# Patient Record
Sex: Female | Born: 1975 | Race: Black or African American | Hispanic: No | Marital: Single | Smoking: Former smoker
Health system: Southern US, Community
[De-identification: ages and names within clinical notes are randomized; demographics above are authoritative.]

## PROBLEM LIST (undated history)

## (undated) DIAGNOSIS — J40 Bronchitis, not specified as acute or chronic: Secondary | ICD-10-CM

## (undated) DIAGNOSIS — I10 Essential (primary) hypertension: Secondary | ICD-10-CM

## (undated) DIAGNOSIS — O039 Complete or unspecified spontaneous abortion without complication: Secondary | ICD-10-CM

## (undated) DIAGNOSIS — J45909 Unspecified asthma, uncomplicated: Secondary | ICD-10-CM

## (undated) HISTORY — DX: Essential (primary) hypertension: I10

## (undated) HISTORY — PX: LIGAMENT REPAIR: SHX5444

## (undated) HISTORY — DX: Bronchitis, not specified as acute or chronic: J40

## (undated) HISTORY — PX: NO PAST SURGERIES: SHX2092

---

## 1999-03-27 ENCOUNTER — Encounter: Payer: Self-pay | Admitting: *Deleted

## 1999-03-27 ENCOUNTER — Ambulatory Visit (HOSPITAL_COMMUNITY): Admission: RE | Admit: 1999-03-27 | Discharge: 1999-03-27 | Payer: Self-pay | Admitting: *Deleted

## 1999-06-20 ENCOUNTER — Inpatient Hospital Stay (HOSPITAL_COMMUNITY): Admission: AD | Admit: 1999-06-20 | Discharge: 1999-06-20 | Payer: Self-pay | Admitting: *Deleted

## 1999-07-20 ENCOUNTER — Inpatient Hospital Stay (HOSPITAL_COMMUNITY): Admission: AD | Admit: 1999-07-20 | Discharge: 1999-07-20 | Payer: Self-pay | Admitting: *Deleted

## 1999-07-30 ENCOUNTER — Inpatient Hospital Stay (HOSPITAL_COMMUNITY): Admission: AD | Admit: 1999-07-30 | Discharge: 1999-07-30 | Payer: Self-pay | Admitting: *Deleted

## 1999-08-02 ENCOUNTER — Inpatient Hospital Stay (HOSPITAL_COMMUNITY): Admission: AD | Admit: 1999-08-02 | Discharge: 1999-08-04 | Payer: Self-pay | Admitting: *Deleted

## 1999-12-19 ENCOUNTER — Encounter: Payer: Self-pay | Admitting: Emergency Medicine

## 1999-12-19 ENCOUNTER — Emergency Department (HOSPITAL_COMMUNITY): Admission: EM | Admit: 1999-12-19 | Discharge: 1999-12-19 | Payer: Self-pay | Admitting: Emergency Medicine

## 2000-07-29 ENCOUNTER — Inpatient Hospital Stay (HOSPITAL_COMMUNITY): Admission: AD | Admit: 2000-07-29 | Discharge: 2000-07-29 | Payer: Self-pay | Admitting: *Deleted

## 2001-01-17 ENCOUNTER — Encounter: Payer: Self-pay | Admitting: Emergency Medicine

## 2001-01-17 ENCOUNTER — Emergency Department (HOSPITAL_COMMUNITY): Admission: EM | Admit: 2001-01-17 | Discharge: 2001-01-18 | Payer: Self-pay | Admitting: Emergency Medicine

## 2001-01-22 ENCOUNTER — Encounter: Payer: Self-pay | Admitting: Emergency Medicine

## 2001-01-22 ENCOUNTER — Emergency Department (HOSPITAL_COMMUNITY): Admission: EM | Admit: 2001-01-22 | Discharge: 2001-01-22 | Payer: Self-pay | Admitting: Ophthalmology

## 2001-02-27 ENCOUNTER — Other Ambulatory Visit: Admission: RE | Admit: 2001-02-27 | Discharge: 2001-02-27 | Payer: Self-pay | Admitting: *Deleted

## 2001-07-13 ENCOUNTER — Emergency Department (HOSPITAL_COMMUNITY): Admission: EM | Admit: 2001-07-13 | Discharge: 2001-07-13 | Payer: Self-pay

## 2001-10-20 ENCOUNTER — Inpatient Hospital Stay (HOSPITAL_COMMUNITY): Admission: AD | Admit: 2001-10-20 | Discharge: 2001-10-20 | Payer: Self-pay | Admitting: *Deleted

## 2002-04-16 ENCOUNTER — Emergency Department (HOSPITAL_COMMUNITY): Admission: EM | Admit: 2002-04-16 | Discharge: 2002-04-16 | Payer: Self-pay

## 2002-09-21 ENCOUNTER — Inpatient Hospital Stay (HOSPITAL_COMMUNITY): Admission: AD | Admit: 2002-09-21 | Discharge: 2002-09-21 | Payer: Self-pay | Admitting: Obstetrics

## 2003-07-16 ENCOUNTER — Inpatient Hospital Stay (HOSPITAL_COMMUNITY): Admission: AD | Admit: 2003-07-16 | Discharge: 2003-07-18 | Payer: Self-pay | Admitting: Obstetrics & Gynecology

## 2004-05-09 ENCOUNTER — Emergency Department (HOSPITAL_COMMUNITY): Admission: EM | Admit: 2004-05-09 | Discharge: 2004-05-09 | Payer: Self-pay | Admitting: Ophthalmology

## 2005-01-09 ENCOUNTER — Emergency Department (HOSPITAL_COMMUNITY): Admission: EM | Admit: 2005-01-09 | Discharge: 2005-01-09 | Payer: Self-pay | Admitting: Emergency Medicine

## 2005-09-25 ENCOUNTER — Emergency Department (HOSPITAL_COMMUNITY): Admission: EM | Admit: 2005-09-25 | Discharge: 2005-09-25 | Payer: Self-pay | Admitting: Family Medicine

## 2005-10-20 ENCOUNTER — Inpatient Hospital Stay (HOSPITAL_COMMUNITY): Admission: AD | Admit: 2005-10-20 | Discharge: 2005-10-20 | Payer: Self-pay | Admitting: Obstetrics

## 2005-10-22 ENCOUNTER — Inpatient Hospital Stay (HOSPITAL_COMMUNITY): Admission: AD | Admit: 2005-10-22 | Discharge: 2005-10-22 | Payer: Self-pay | Admitting: Obstetrics

## 2005-10-25 ENCOUNTER — Inpatient Hospital Stay (HOSPITAL_COMMUNITY): Admission: AD | Admit: 2005-10-25 | Discharge: 2005-10-25 | Payer: Self-pay | Admitting: Obstetrics

## 2006-11-01 ENCOUNTER — Emergency Department (HOSPITAL_COMMUNITY): Admission: EM | Admit: 2006-11-01 | Discharge: 2006-11-01 | Payer: Self-pay | Admitting: Family Medicine

## 2007-12-29 ENCOUNTER — Inpatient Hospital Stay (HOSPITAL_COMMUNITY): Admission: AD | Admit: 2007-12-29 | Discharge: 2007-12-29 | Payer: Self-pay | Admitting: Obstetrics & Gynecology

## 2008-03-29 ENCOUNTER — Emergency Department (HOSPITAL_COMMUNITY): Admission: EM | Admit: 2008-03-29 | Discharge: 2008-03-29 | Payer: Self-pay | Admitting: Emergency Medicine

## 2010-02-20 ENCOUNTER — Emergency Department (HOSPITAL_COMMUNITY): Admission: EM | Admit: 2010-02-20 | Discharge: 2010-02-20 | Payer: Self-pay | Admitting: Family Medicine

## 2010-03-27 ENCOUNTER — Emergency Department (HOSPITAL_COMMUNITY): Admission: EM | Admit: 2010-03-27 | Discharge: 2010-03-27 | Payer: Self-pay | Admitting: Family Medicine

## 2010-06-27 ENCOUNTER — Emergency Department (HOSPITAL_COMMUNITY): Admission: EM | Admit: 2010-06-27 | Discharge: 2010-06-27 | Payer: Self-pay | Admitting: Emergency Medicine

## 2010-11-14 ENCOUNTER — Inpatient Hospital Stay (INDEPENDENT_AMBULATORY_CARE_PROVIDER_SITE_OTHER)
Admission: RE | Admit: 2010-11-14 | Discharge: 2010-11-14 | Disposition: A | Discharge status: Home / Self Care | Payer: Self-pay | Source: Ambulatory Visit | Attending: Family Medicine | Admitting: Family Medicine

## 2010-11-14 DIAGNOSIS — J309 Allergic rhinitis, unspecified: Secondary | ICD-10-CM

## 2010-11-14 DIAGNOSIS — J40 Bronchitis, not specified as acute or chronic: Secondary | ICD-10-CM

## 2011-01-26 NOTE — H&P (Signed)
[facility]  Patient:    Taylor Wilkinson, Taylor Wilkinson                    MRN: [id number] Adm. Date:  [id number] Disc. Date: [id number] Attending:  Donnetta Hutching                         History and Physical  HISTORY:                      The patient is a 35 year old gravida 6, para 2, AB 3, who was admitted in active labor.  At the time of admission patient had a cervix that was 3 cm dilated, a vertex at a minus 3 station, 80% effaced. Patient had no prenatal complications.  A significant thing is that patient did have three therapeutic abortions in the past.  DIAGNOSIS:                    intrauterine pregnancy at term.  PLAN:                         Labor and delivery. DD:  12/20/99 TD:  12/20/99 Job: [id number]

## 2011-02-16 ENCOUNTER — Inpatient Hospital Stay (INDEPENDENT_AMBULATORY_CARE_PROVIDER_SITE_OTHER)
Admission: RE | Admit: 2011-02-16 | Discharge: 2011-02-16 | Disposition: A | Discharge status: Home / Self Care | Payer: Self-pay | Source: Ambulatory Visit | Attending: Emergency Medicine | Admitting: Emergency Medicine

## 2011-02-16 DIAGNOSIS — J45909 Unspecified asthma, uncomplicated: Secondary | ICD-10-CM

## 2011-05-26 ENCOUNTER — Inpatient Hospital Stay (INDEPENDENT_AMBULATORY_CARE_PROVIDER_SITE_OTHER)
Admission: RE | Admit: 2011-05-26 | Discharge: 2011-05-26 | Disposition: A | Discharge status: Home / Self Care | Payer: Self-pay | Source: Ambulatory Visit | Attending: Family Medicine | Admitting: Family Medicine

## 2011-05-26 DIAGNOSIS — F17200 Nicotine dependence, unspecified, uncomplicated: Secondary | ICD-10-CM

## 2011-05-26 DIAGNOSIS — J9801 Acute bronchospasm: Secondary | ICD-10-CM

## 2011-05-26 DIAGNOSIS — J40 Bronchitis, not specified as acute or chronic: Secondary | ICD-10-CM

## 2011-06-05 LAB — URINALYSIS, ROUTINE W REFLEX MICROSCOPIC
Glucose, UA: NEGATIVE
Ketones, ur: NEGATIVE
Nitrite: NEGATIVE
Specific Gravity, Urine: <1.005 — ABNORMAL LOW
Urobilinogen, UA: 0.2
pH: 6.5

## 2011-06-05 LAB — POCT PREGNANCY, URINE: Operator id: 242691

## 2011-06-05 LAB — URINE MICROSCOPIC-ADD ON

## 2011-06-08 ENCOUNTER — Inpatient Hospital Stay (INDEPENDENT_AMBULATORY_CARE_PROVIDER_SITE_OTHER)
Admission: RE | Admit: 2011-06-08 | Discharge: 2011-06-08 | Disposition: A | Discharge status: Home / Self Care | Payer: Self-pay | Source: Ambulatory Visit | Attending: Emergency Medicine | Admitting: Emergency Medicine

## 2011-06-08 DIAGNOSIS — B86 Scabies: Secondary | ICD-10-CM

## 2011-12-29 ENCOUNTER — Encounter (HOSPITAL_COMMUNITY): Payer: Self-pay

## 2011-12-29 ENCOUNTER — Emergency Department (INDEPENDENT_AMBULATORY_CARE_PROVIDER_SITE_OTHER)
Admission: EM | Admit: 2011-12-29 | Discharge: 2011-12-29 | Disposition: A | Discharge status: Home / Self Care | Payer: Self-pay | Source: Home / Self Care | Attending: Family Medicine | Admitting: Family Medicine

## 2011-12-29 DIAGNOSIS — J302 Other seasonal allergic rhinitis: Secondary | ICD-10-CM

## 2011-12-29 DIAGNOSIS — J45909 Unspecified asthma, uncomplicated: Secondary | ICD-10-CM

## 2011-12-29 DIAGNOSIS — J309 Allergic rhinitis, unspecified: Secondary | ICD-10-CM

## 2011-12-29 MED ORDER — FLUTICASONE PROPIONATE 50 MCG/ACT NA SUSP: 1.0000 | Freq: Two times a day (BID) | NASAL | Status: DC

## 2011-12-29 MED ORDER — ALBUTEROL SULFATE (5 MG/ML) 0.5% IN NEBU
5.0000 mg | INHALATION_SOLUTION | Freq: Once | RESPIRATORY_TRACT | Status: AC
  Administered 2011-12-29: 5 mg via RESPIRATORY_TRACT

## 2011-12-29 MED ORDER — METHYLPREDNISOLONE ACETATE 80 MG/ML IJ SUSP
INTRAMUSCULAR | Status: AC
  Filled 2011-12-29: qty 1

## 2011-12-29 MED ORDER — ALBUTEROL SULFATE (5 MG/ML) 0.5% IN NEBU
INHALATION_SOLUTION | RESPIRATORY_TRACT | Status: AC
  Filled 2011-12-29: qty 1

## 2011-12-29 MED ORDER — ALBUTEROL SULFATE HFA 108 (90 BASE) MCG/ACT IN AERS: 1.0000 | INHALATION_SPRAY | Freq: Four times a day (QID) | RESPIRATORY_TRACT | Status: DC | PRN

## 2011-12-29 MED ORDER — IPRATROPIUM BROMIDE 0.02 % IN SOLN
0.5000 mg | Freq: Once | RESPIRATORY_TRACT | Status: AC
  Administered 2011-12-29: 0.5 mg via RESPIRATORY_TRACT

## 2011-12-29 MED ORDER — METHYLPREDNISOLONE ACETATE 40 MG/ML IJ SUSP
80.0000 mg | Freq: Once | INTRAMUSCULAR | Status: AC
  Administered 2011-12-29: 80 mg via INTRAMUSCULAR

## 2011-12-29 MED ORDER — CETIRIZINE HCL 10 MG PO TABS: 10.0000 mg | ORAL_TABLET | Freq: Every day | ORAL | Status: DC

## 2011-12-29 NOTE — ED Provider Notes (Signed)
History     CSN: [id number]  Arrival date & time 12/29/11  1025   First MD Initiated Contact with Patient 12/29/11 1038      Chief Complaint  Patient presents with  . URI    (Consider location/radiation/quality/duration/timing/severity/associated sxs/prior treatment) Patient is a 36 y.o. female presenting with URI. The history is provided by the patient.  URI The primary symptoms include sore throat, cough and wheezing. Primary symptoms do not include fever, nausea, vomiting or myalgias. The current episode started yesterday. This is a new problem. The problem has not changed since onset. Symptoms associated with the illness include congestion and rhinorrhea. Risk factors: smoker.    History reviewed. No pertinent past medical history.  History reviewed. No pertinent past surgical history.  History reviewed. No pertinent family history.  History  Substance Use Topics  . Smoking status: Current Some Day Smoker  . Smokeless tobacco: Not on file  . Alcohol Use: Yes    OB History    Grav Para Term Preterm Abortions TAB SAB Ect Mult Living                  Review of Systems  Constitutional: Negative.  Negative for fever.  HENT: Positive for congestion, sore throat, rhinorrhea and postnasal drip.   Respiratory: Positive for cough and wheezing.   Gastrointestinal: Negative for nausea, vomiting and diarrhea.  Musculoskeletal: Negative for myalgias.  Skin: Negative.     Allergies  Review of patient's allergies indicates no known allergies.  Home Medications   Current Outpatient Rx  Name Route Sig Dispense Refill  . ASPIRIN EFFERVESCENT 325 MG PO TBEF Oral Take 325 mg by mouth every 6 (six) hours as needed.    Marland Kitchen CETIRIZINE HCL 10 MG PO TABS Oral Take 1 tablet (10 mg total) by mouth daily. One tab daily for allergies 30 tablet 1  . FLUTICASONE PROPIONATE 50 MCG/ACT NA SUSP Nasal Place 1 spray into the nose 2 (two) times daily. 1 g 2    BP 129/85  Pulse 76   Temp(Src) 98.6 F (37 C) (Oral)  Resp 16  SpO2 100%  LMP 12/26/2011  Physical Exam  Nursing note and vitals reviewed. Constitutional: She is oriented to person, place, and time. She appears well-nourished.  HENT:  Head: Normocephalic.  Right Ear: External ear normal.  Left Ear: External ear normal.  Nose: Mucosal edema and rhinorrhea present.  Mouth/Throat: Oropharynx is clear and moist.  Eyes: EOM are normal. Pupils are equal, round, and reactive to light.  Neck: Normal range of motion. Neck supple.  Cardiovascular: Normal rate, regular rhythm, normal heart sounds and intact distal pulses.   Pulmonary/Chest: She has wheezes. She has no rales.  Abdominal: Soft. Bowel sounds are normal.  Lymphadenopathy:    She has no cervical adenopathy.  Neurological: She is alert and oriented to person, place, and time.  Skin: Skin is warm and dry.    ED Course  Procedures (including critical care time)  Labs Reviewed - No data to display No results found.   1. Seasonal allergies   2. Asthma with allergic rhinitis       MDM  Sx improved after neb treatment.        Linna Hoff, MD 12/29/11 1104

## 2011-12-29 NOTE — ED Notes (Signed)
Pt has wheezing, sorethroat and head congestion for one day.

## 2012-01-03 ENCOUNTER — Emergency Department (HOSPITAL_COMMUNITY)
Admission: EM | Admit: 2012-01-03 | Discharge: 2012-01-03 | Disposition: A | Discharge status: Home / Self Care | Payer: No Typology Code available for payment source | Attending: Emergency Medicine | Admitting: Emergency Medicine

## 2012-01-03 ENCOUNTER — Encounter (HOSPITAL_COMMUNITY): Payer: Self-pay | Admitting: *Deleted

## 2012-01-03 DIAGNOSIS — Y92410 Unspecified street and highway as the place of occurrence of the external cause: Secondary | ICD-10-CM | POA: Insufficient documentation

## 2012-01-03 DIAGNOSIS — M25519 Pain in unspecified shoulder: Secondary | ICD-10-CM | POA: Insufficient documentation

## 2012-01-03 DIAGNOSIS — M546 Pain in thoracic spine: Secondary | ICD-10-CM | POA: Insufficient documentation

## 2012-01-03 MED ORDER — DIAZEPAM 5 MG PO TABS: 5.0000 mg | ORAL_TABLET | Freq: Two times a day (BID) | ORAL | Status: AC

## 2012-01-03 MED ORDER — IBUPROFEN 600 MG PO TABS: 600.0000 mg | ORAL_TABLET | Freq: Four times a day (QID) | ORAL | Status: AC | PRN
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
## 2012-01-03 NOTE — ED Notes (Signed)
Neck brace applied.
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
## 2012-01-05 NOTE — ED Provider Notes (Signed)
Evaluation and management procedures were performed by the PA/NP/resident physician under my supervision/collaboration.   Taylor Wilkinson D Noella Kipnis, MD 01/05/12 2052 

## 2012-03-28 ENCOUNTER — Emergency Department (HOSPITAL_COMMUNITY)
Admission: EM | Admit: 2012-03-28 | Discharge: 2012-03-28 | Disposition: A | Discharge status: Home / Self Care | Payer: Self-pay | Attending: Emergency Medicine | Admitting: Emergency Medicine

## 2012-03-28 ENCOUNTER — Encounter (HOSPITAL_COMMUNITY): Payer: Self-pay | Admitting: Emergency Medicine

## 2012-03-28 DIAGNOSIS — J45901 Unspecified asthma with (acute) exacerbation: Secondary | ICD-10-CM | POA: Insufficient documentation

## 2012-03-28 DIAGNOSIS — F17200 Nicotine dependence, unspecified, uncomplicated: Secondary | ICD-10-CM | POA: Insufficient documentation

## 2012-03-28 HISTORY — DX: Unspecified asthma, uncomplicated: J45.909

## 2012-03-28 MED ORDER — PREDNISONE 50 MG PO TABS: ORAL_TABLET | ORAL | Status: AC

## 2012-03-28 MED ORDER — ALBUTEROL SULFATE (5 MG/ML) 0.5% IN NEBU
2.5000 mg | INHALATION_SOLUTION | RESPIRATORY_TRACT | Status: AC
  Administered 2012-03-28: 2.5 mg via RESPIRATORY_TRACT
  Filled 2012-03-28: qty 20

## 2012-03-28 MED ORDER — PREDNISONE 20 MG PO TABS
60.0000 mg | ORAL_TABLET | ORAL | Status: AC
  Administered 2012-03-28: 60 mg via ORAL
  Filled 2012-03-28: qty 3

## 2012-03-28 MED ORDER — ALBUTEROL SULFATE HFA 108 (90 BASE) MCG/ACT IN AERS
2.0000 | INHALATION_SPRAY | Freq: Four times a day (QID) | RESPIRATORY_TRACT | Status: DC
  Administered 2012-03-28: 2 via RESPIRATORY_TRACT
  Filled 2012-03-28: qty 6.7

## 2012-03-28 MED ORDER — ALBUTEROL SULFATE (5 MG/ML) 0.5% IN NEBU: 2.5000 mg | INHALATION_SOLUTION | Freq: Four times a day (QID) | RESPIRATORY_TRACT | Status: DC | PRN

## 2012-03-28 NOTE — ED Notes (Signed)
Pt c/o increased SOB and wheezing starting this am; pt sts out of albuterol x 3 days

## 2012-03-28 NOTE — ED Provider Notes (Signed)
History     CSN: [id number]  Arrival date & time 03/28/12  0806   First MD Initiated Contact with Patient 03/28/12 0809      Chief Complaint  Patient presents with  . Shortness of Breath  . Asthma    (Consider location/radiation/quality/duration/timing/severity/associated sxs/prior treatment) HPI The patient presents with dyspnea and wheezing.  She does her symptoms began gradually approximately one day ago.  Notably, the patient ran out of her albuterol 3 days ago.  Since onset yesterday symptoms have been increasing.  She notes that symptoms are mild yesterday, but upon awakening today, approximately 2 hours ago, her symptoms were more pronounced for wheezing, mild dyspnea.  She denies any concurrent chest pain, fevers, chills, significant cough, lightheadedness, syncope. She states that she has otherwise been in her usual state of health. Past Medical History  Diagnosis Date  . Asthma     History reviewed. No pertinent past surgical history.  History reviewed. No pertinent family history.  History  Substance Use Topics  . Smoking status: Current Some Day Smoker  . Smokeless tobacco: Not on file  . Alcohol Use: Yes    OB History    Grav Para Term Preterm Abortions TAB SAB Ect Mult Living                  Review of Systems  All other systems reviewed and are negative.    Allergies  Review of patient's allergies indicates no known allergies.  Home Medications   Current Outpatient Rx  Name Route Sig Dispense Refill  . ALBUTEROL SULFATE HFA 108 (90 BASE) MCG/ACT IN AERS Inhalation Inhale 2 puffs into the lungs every 6 (six) hours as needed. For shortness of breath    . ALBUTEROL SULFATE (5 MG/ML) 0.5% IN NEBU Nebulization Take 0.5 mLs (2.5 mg total) by nebulization every 6 (six) hours as needed for wheezing. 20 mL 12  . PREDNISONE 50 MG PO TABS  One tab daily for four days 4 tablet 0    BP 113/78  Pulse 78  Temp 98.3 F (36.8 C) (Oral)  Resp 20  SpO2  99%  Physical Exam  Nursing note and vitals reviewed. Constitutional: She is oriented to person, place, and time. She appears well-developed and well-nourished. No distress.  HENT:  Head: Normocephalic and atraumatic.  Eyes: Conjunctivae and EOM are normal.  Cardiovascular: Normal rate and regular rhythm.   Pulmonary/Chest: Effort normal. No stridor. No respiratory distress. She has wheezes. She has no rales.  Abdominal: She exhibits no distension.  Musculoskeletal: She exhibits no edema.  Neurological: She is alert and oriented to person, place, and time. No cranial nerve deficit.  Skin: Skin is warm and dry.  Psychiatric: She has a normal mood and affect.    ED Course  Procedures (including critical care time)  Labs Reviewed - No data to display No results found.   1. Asthma exacerbation      Pulse ox 98% room air normal  MDM  Gen. appearing female presents with ongoing wheezing and dyspnea.  Notably, the patient is otherwise healthy, has a history of asthma and recently ran out of her albuterol.  On exam the patient is in no distress with no evidence of concurrent systemic infection, distress.  Given the wheezing on exam, the patient was provided an albuterol inhaler and started on steroids.  She was provided outpatient medications, instructed to followup with primary care physician and to return for changes in her condition.  Gerhard Munch, MD  03/28/12 0834 

## 2012-04-09 ENCOUNTER — Encounter (HOSPITAL_COMMUNITY): Payer: Self-pay | Admitting: *Deleted

## 2012-04-09 ENCOUNTER — Emergency Department (INDEPENDENT_AMBULATORY_CARE_PROVIDER_SITE_OTHER)
Admission: EM | Admit: 2012-04-09 | Discharge: 2012-04-09 | Disposition: A | Discharge status: Home / Self Care | Payer: Self-pay | Source: Home / Self Care

## 2012-04-09 DIAGNOSIS — J45901 Unspecified asthma with (acute) exacerbation: Secondary | ICD-10-CM

## 2012-04-09 MED ORDER — PREDNISONE 10 MG PO TABS: ORAL_TABLET | ORAL | Status: DC

## 2012-04-09 MED ORDER — ALBUTEROL SULFATE HFA 108 (90 BASE) MCG/ACT IN AERS: 2.0000 | INHALATION_SPRAY | Freq: Four times a day (QID) | RESPIRATORY_TRACT | Status: DC | PRN

## 2012-04-09 NOTE — ED Notes (Signed)
Pt reports chronic cough that is irritated by smoking with pain in back

## 2012-04-09 NOTE — ED Provider Notes (Signed)
History     CSN: [id number]  Arrival date & time 04/09/12  1859   First MD Initiated Contact with Patient 04/09/12 1922      No chief complaint on file.   (Consider location/radiation/quality/duration/timing/severity/associated sxs/prior treatment) HPI Comments: She also relates she was treated this month about 2 weeks ago with a short course of steroids. After she finished her course her symptoms recur. She continues using albuterol intermittently.   Patient is a 36 y.o. female presenting with cough. The history is provided by the patient.  Cough This is a recurrent problem. The current episode started more than 2 days ago. The problem occurs constantly. The cough is productive of sputum. There has been no fever. Associated symptoms include shortness of breath. Pertinent negatives include no chest pain, no chills, no sweats, no weight loss, no ear pain, no sore throat and no wheezing. She has tried cough syrup and decongestants for the symptoms. The treatment provided significant relief. She is a smoker. Her past medical history is significant for bronchitis.    Past Medical History  Diagnosis Date  . Asthma     No past surgical history on file.  No family history on file.  History  Substance Use Topics  . Smoking status: Current Some Day Smoker  . Smokeless tobacco: Not on file  . Alcohol Use: Yes    OB History    Grav Para Term Preterm Abortions TAB SAB Ect Mult Living                  Review of Systems  Constitutional: Negative for chills and weight loss.  HENT: Negative for ear pain, sore throat and postnasal drip.   Respiratory: Positive for cough, chest tightness and shortness of breath. Negative for wheezing.   Cardiovascular: Negative for chest pain.    Allergies  Review of patient's allergies indicates no known allergies.  Home Medications   Current Outpatient Rx  Name Route Sig Dispense Refill  . ALBUTEROL SULFATE HFA 108 (90 BASE) MCG/ACT IN AERS  Inhalation Inhale 2 puffs into the lungs every 6 (six) hours as needed. For shortness of breath    . ALBUTEROL SULFATE (5 MG/ML) 0.5% IN NEBU Nebulization Take 0.5 mLs (2.5 mg total) by nebulization every 6 (six) hours as needed for wheezing. 20 mL 12    There were no vitals taken for this visit.  Physical Exam  Nursing note and vitals reviewed. Constitutional: She appears well-developed and well-nourished. No distress.  HENT:  Head: Normocephalic.  Eyes: Pupils are equal, round, and reactive to light.  Neck: Normal range of motion.  Cardiovascular: Normal rate, regular rhythm, normal heart sounds, intact distal pulses and normal pulses.   Pulmonary/Chest: No respiratory distress. She has wheezes in the right middle field, the right lower field, the left upper field and the left middle field. She has rhonchi in the right middle field, the right lower field, the left upper field and the left middle field. She has no rales. She exhibits no tenderness.  Abdominal: Soft. Normal appearance and bowel sounds are normal. There is no tenderness. There is no rigidity, no guarding, no tenderness at McBurney's point and negative Murphy's sign.    ED Course  Procedures (including critical care time)  Labs Reviewed - No data to display No results found.   No diagnosis found.    MDM  36 year old female with past medical history of asthma comes in for persistent cough. She relates she  was treated with steroids  and her shortness of breath and cough got significantly improved. Several days after she finished her course of steroid her symptoms recurred. I will go ahead and start her again short course steroids and will refill the albuterol. Her saturations remained above 95% on room air. She is not tachycardic breathing 16-20 times per minute and her blood pressure seems to be stable.  Also it bothers her to stop smoking. As this will continue as she continues to smoke.       Marinda Elk, MD 04/09/12 2017

## 2012-06-10 ENCOUNTER — Encounter (HOSPITAL_COMMUNITY): Payer: Self-pay | Admitting: *Deleted

## 2012-06-10 ENCOUNTER — Emergency Department (HOSPITAL_COMMUNITY)
Admission: EM | Admit: 2012-06-10 | Discharge: 2012-06-10 | Disposition: A | Discharge status: Home / Self Care | Payer: Self-pay | Attending: Emergency Medicine | Admitting: Emergency Medicine

## 2012-06-10 DIAGNOSIS — L0201 Cutaneous abscess of face: Secondary | ICD-10-CM | POA: Insufficient documentation

## 2012-06-10 DIAGNOSIS — J45909 Unspecified asthma, uncomplicated: Secondary | ICD-10-CM | POA: Insufficient documentation

## 2012-06-10 DIAGNOSIS — F17200 Nicotine dependence, unspecified, uncomplicated: Secondary | ICD-10-CM | POA: Insufficient documentation

## 2012-06-10 DIAGNOSIS — L0291 Cutaneous abscess, unspecified: Secondary | ICD-10-CM

## 2012-06-10 DIAGNOSIS — L03211 Cellulitis of face: Secondary | ICD-10-CM | POA: Insufficient documentation

## 2012-06-10 MED ORDER — CEPHALEXIN 250 MG PO CAPS: 500.0000 mg | ORAL_CAPSULE | Freq: Two times a day (BID) | ORAL | Status: DC

## 2012-06-10 MED ORDER — SULFAMETHOXAZOLE-TRIMETHOPRIM 800-160 MG PO TABS: 1.0000 | ORAL_TABLET | Freq: Two times a day (BID) | ORAL | Status: DC

## 2012-06-10 MED ORDER — TRAMADOL HCL 50 MG PO TABS: 50.0000 mg | ORAL_TABLET | Freq: Four times a day (QID) | ORAL | Status: DC | PRN

## 2012-06-10 NOTE — ED Provider Notes (Signed)
History  Scribed for Loren Racer, MD, the patient was seen in room TR06C/TR06C. This chart was scribed by Candelaria Stagers. The patient's care started at 7:38 PM   CSN: [id number]  Arrival date & time 06/10/12  1646   First MD Initiated Contact with Patient 06/10/12 1724      Chief Complaint  Patient presents with  . Recurrent Skin Infections    The history is provided by the patient. No language interpreter was used.   Taylor Wilkinson is a 36 y.o. female who presents to the Emergency Department complaining of an area of tenderness to the right temple that started several days ago and has gradually gotten larger in size.  She is also experiencing right ear pain.     Past Medical History  Diagnosis Date  . Asthma     History reviewed. No pertinent past surgical history.  No family history on file.  History  Substance Use Topics  . Smoking status: Current Some Day Smoker  . Smokeless tobacco: Not on file  . Alcohol Use: Yes     social    OB History    Grav Para Term Preterm Abortions TAB SAB Ect Mult Living                  Review of Systems  HENT:       Area of tenderness to the right temple  All other systems reviewed and are negative.    Allergies  Review of patient's allergies indicates no known allergies.  Home Medications   Current Outpatient Rx  Name Route Sig Dispense Refill  . ALBUTEROL SULFATE HFA 108 (90 BASE) MCG/ACT IN AERS Inhalation Inhale 2 puffs into the lungs every 6 (six) hours as needed. For shortness of breath    . ALBUTEROL SULFATE (5 MG/ML) 0.5% IN NEBU Nebulization Take 0.5 mLs (2.5 mg total) by nebulization every 6 (six) hours as needed for wheezing. 20 mL 12  . CEPHALEXIN 250 MG PO CAPS Oral Take 2 capsules (500 mg total) by mouth 2 (two) times daily. 14 capsule 0  . SULFAMETHOXAZOLE-TRIMETHOPRIM 800-160 MG PO TABS Oral Take 1 tablet by mouth 2 (two) times daily. 14 tablet 0  . TRAMADOL HCL 50 MG PO TABS Oral Take 1 tablet (50 mg  total) by mouth every 6 (six) hours as needed for pain. 15 tablet 0    BP 103/68  Pulse 102  Temp 98.6 F (37 C) (Oral)  Resp 20  SpO2 97%  Physical Exam  Nursing note and vitals reviewed. Constitutional: She is oriented to person, place, and time. She appears well-developed and well-nourished. No distress.  HENT:  Head: Normocephalic and atraumatic.       Pre auricular nodule on the right side.  No fluctuant, mild erythema, mild tenderness.  TM on right side clear.    Eyes: EOM are normal. Pupils are equal, round, and reactive to light.  Neck: Neck supple. No tracheal deviation present.  Pulmonary/Chest: Effort normal. No respiratory distress.  Musculoskeletal: Normal range of motion. She exhibits no edema.  Neurological: She is alert and oriented to person, place, and time.  Skin: Skin is warm and dry.  Psychiatric: She has a normal mood and affect. Her behavior is normal.    ED Course  Procedures   DIAGNOSTIC STUDIES: Oxygen Saturation is 97% on room air, normal by my interpretation.    COORDINATION OF CARE:   7:38 PM Abscess I&D performed by Loren Racer, MD.  Labs Reviewed - No data to display No results found.   1. Abscess     INCISION AND DRAINAGE Performed by: Ranae Palms, Natiya Seelinger Consent: Verbal consent obtained. Risks and benefits: risks, benefits and alternatives were discussed Type: abscess  Body area: R pre-auricular region  Anesthesia: local infiltration  Local anesthetic: lidocaine 1% with epinephrine  Anesthetic total: 1ml  Complexity: simple Blunt dissection to break up loculations  Drainage: purulent  Drainage amount: small  Packing material: none  Patient tolerance: Patient tolerated the procedure well with no immediate complications.     MDM  I personally performed the services described in this documentation, which was scribed in my presence. The recorded information has been reviewed and considered.       Loren Racer, MD 06/10/12 [phone]

## 2012-06-10 NOTE — ED Notes (Signed)
The pt has a boil on her rt  Face in her hairline for 3 days.  Getting more painful

## 2012-12-01 ENCOUNTER — Emergency Department (HOSPITAL_COMMUNITY)
Admission: EM | Admit: 2012-12-01 | Discharge: 2012-12-01 | Disposition: A | Discharge status: Home / Self Care | Payer: Self-pay | Attending: Emergency Medicine | Admitting: Emergency Medicine

## 2012-12-01 ENCOUNTER — Emergency Department (HOSPITAL_COMMUNITY): Payer: Self-pay

## 2012-12-01 ENCOUNTER — Encounter (HOSPITAL_COMMUNITY): Payer: Self-pay | Admitting: *Deleted

## 2012-12-01 DIAGNOSIS — F17200 Nicotine dependence, unspecified, uncomplicated: Secondary | ICD-10-CM | POA: Insufficient documentation

## 2012-12-01 DIAGNOSIS — M25552 Pain in left hip: Secondary | ICD-10-CM

## 2012-12-01 DIAGNOSIS — Z79899 Other long term (current) drug therapy: Secondary | ICD-10-CM | POA: Insufficient documentation

## 2012-12-01 DIAGNOSIS — J45909 Unspecified asthma, uncomplicated: Secondary | ICD-10-CM | POA: Insufficient documentation

## 2012-12-01 DIAGNOSIS — M25559 Pain in unspecified hip: Secondary | ICD-10-CM | POA: Insufficient documentation

## 2012-12-01 MED ORDER — IBUPROFEN 400 MG PO TABS
400.0000 mg | ORAL_TABLET | Freq: Once | ORAL | Status: AC
  Administered 2012-12-01: 400 mg via ORAL
  Filled 2012-12-01: qty 1

## 2012-12-01 NOTE — ED Notes (Signed)
Vitals taken by Eli, EMT 

## 2012-12-01 NOTE — ED Provider Notes (Signed)
History     CSN: [id number]  Arrival date & time 12/01/12  1020   First MD Initiated Contact with Patient 12/01/12 1156      Chief Complaint  Patient presents with  . Hip Pain    (Consider location/radiation/quality/duration/timing/severity/associated sxs/prior treatment) HPI Comments: Taylor Wilkinson is a 37 y.o. Female who complains of left hip pain, for 2 days. The pain is persistent. It is worse with movement. She has not tried it before it. There is no specific trauma. She denies chest pain, shortness of breath, weakness, or dizziness. There's been no fever, chills, nausea or vomiting. There are no other known modifying factors.  Patient is a 37 y.o. female presenting with hip pain. The history is provided by the patient.  Hip Pain    Past Medical History  Diagnosis Date  . Asthma     History reviewed. No pertinent past surgical history.  History reviewed. No pertinent family history.  History  Substance Use Topics  . Smoking status: Current Some Day Smoker  . Smokeless tobacco: Not on file  . Alcohol Use: Yes     Comment: social    OB History   Grav Para Term Preterm Abortions TAB SAB Ect Mult Living                  Review of Systems  All other systems reviewed and are negative.    Allergies  Review of patient's allergies indicates no known allergies.  Home Medications   Current Outpatient Rx  Name  Route  Sig  Dispense  Refill  . albuterol (PROVENTIL HFA;VENTOLIN HFA) 108 (90 BASE) MCG/ACT inhaler   Inhalation   Inhale 2 puffs into the lungs every 6 (six) hours as needed. For shortness of breath         . albuterol (PROVENTIL) (5 MG/ML) 0.5% nebulizer solution   Nebulization   Take 0.5 mLs (2.5 mg total) by nebulization every 6 (six) hours as needed for wheezing.   20 mL   12   . cephALEXin (KEFLEX) 250 MG capsule   Oral   Take 2 capsules (500 mg total) by mouth 2 (two) times daily.   14 capsule   0   . sulfamethoxazole-trimethoprim  (SEPTRA DS) 800-160 MG per tablet   Oral   Take 1 tablet by mouth 2 (two) times daily.   14 tablet   0   . traMADol (ULTRAM) 50 MG tablet   Oral   Take 1 tablet (50 mg total) by mouth every 6 (six) hours as needed for pain.   15 tablet   0     BP 141/81  Pulse 91  Temp(Src) 97 F (36.1 C) (Oral)  Resp 20  SpO2 97%  Physical Exam  Nursing note and vitals reviewed. Constitutional: She is oriented to person, place, and time. She appears well-developed and well-nourished.  HENT:  Head: Normocephalic and atraumatic.  Eyes: Conjunctivae and EOM are normal. Pupils are equal, round, and reactive to light.  Neck: Normal range of motion and phonation normal. Neck supple.  Cardiovascular: Normal rate.   Pulmonary/Chest: Effort normal.  Abdominal: She exhibits no distension.  Musculoskeletal: Normal range of motion.  Tender left anterior hip to palpation with increased pain on passive internal and external rotation of the left hip. She walks with a mildly due to pain in the left hip  Neurological: She is alert and oriented to person, place, and time. She has normal strength. She exhibits normal muscle tone.  Skin: Skin is warm and dry.  Psychiatric: She has a normal mood and affect. Her behavior is normal. Judgment and thought content normal.    ED Course  Procedures (including critical care time)  Labs Reviewed - No data to display Dg Pelvis 1-2 Views  12/01/2012  *RADIOLOGY REPORT*  Clinical Data: Left hip pain, no injury  PELVIS - 1-2 VIEW  Comparison: None  Findings: Osseous mineralization normal. Symmetric preserved hip and SI joints. No acute fracture, dislocation, or bone destruction. Visualized soft tissues radiographically unremarkable.  IMPRESSION: No acute abnormalities.   Original Report Authenticated By: Ulyses Southward, M.D.    Nursing Notes Reviewed/ Care Coordinated, and agree without changes. Applicable Imaging Reviewed Interpretation of Laboratory Data incorporated  into ED treatment  1. Hip pain, acute, left       MDM  Musculoskeletal left hip pain. Doubt urologic gynecologic or spinal source for the pain. She is stable for discharge   Plan: Home Medications- Advil; Home Treatments- Heat; Recommended follow up- PCP prn        Flint Melter, MD 12/01/12 1357
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
## 2012-12-14 ENCOUNTER — Emergency Department (HOSPITAL_COMMUNITY): Payer: Medicaid Other

## 2012-12-14 ENCOUNTER — Encounter (HOSPITAL_COMMUNITY): Payer: Self-pay

## 2012-12-14 ENCOUNTER — Emergency Department (HOSPITAL_COMMUNITY)
Admission: EM | Admit: 2012-12-14 | Discharge: 2012-12-14 | Disposition: A | Discharge status: Home / Self Care | Payer: Medicaid Other | Attending: Emergency Medicine | Admitting: Emergency Medicine

## 2012-12-14 DIAGNOSIS — F17200 Nicotine dependence, unspecified, uncomplicated: Secondary | ICD-10-CM | POA: Insufficient documentation

## 2012-12-14 DIAGNOSIS — Z79899 Other long term (current) drug therapy: Secondary | ICD-10-CM | POA: Insufficient documentation

## 2012-12-14 DIAGNOSIS — R062 Wheezing: Secondary | ICD-10-CM

## 2012-12-14 DIAGNOSIS — J45901 Unspecified asthma with (acute) exacerbation: Secondary | ICD-10-CM | POA: Insufficient documentation

## 2012-12-14 MED ORDER — ALBUTEROL SULFATE HFA 108 (90 BASE) MCG/ACT IN AERS: 1.0000 | INHALATION_SPRAY | Freq: Four times a day (QID) | RESPIRATORY_TRACT | Status: DC | PRN

## 2012-12-14 MED ORDER — ALBUTEROL SULFATE (5 MG/ML) 0.5% IN NEBU
5.0000 mg | INHALATION_SOLUTION | Freq: Once | RESPIRATORY_TRACT | Status: AC
  Administered 2012-12-14: 5 mg via RESPIRATORY_TRACT
  Filled 2012-12-14: qty 1

## 2012-12-14 MED ORDER — PREDNISONE 20 MG PO TABS: 40.0000 mg | ORAL_TABLET | Freq: Every day | ORAL | Status: DC

## 2012-12-14 MED ORDER — DEXAMETHASONE SODIUM PHOSPHATE 10 MG/ML IJ SOLN
10.0000 mg | Freq: Once | INTRAMUSCULAR | Status: AC
  Administered 2012-12-14: 10 mg via INTRAMUSCULAR
  Filled 2012-12-14: qty 1

## 2012-12-14 MED ORDER — IPRATROPIUM BROMIDE 0.02 % IN SOLN
0.5000 mg | Freq: Once | RESPIRATORY_TRACT | Status: AC
  Administered 2012-12-14: 0.5 mg via RESPIRATORY_TRACT
  Filled 2012-12-14: qty 2.5

## 2012-12-14 NOTE — ED Notes (Addendum)
Pt states she takes albuterol treatments at home.  Pt states she has been having to take them every 4 hours and she thinks that is too much.  Pt states she feels that it's not working.  Pt talking in complete sentences and is in NAD.

## 2012-12-14 NOTE — ED Notes (Signed)
Patient transported to X-ray 

## 2012-12-14 NOTE — ED Provider Notes (Signed)
History     CSN: [id number]  Arrival date & time 12/14/12  0911   First MD Initiated Contact with Patient 12/14/12 [phone]      Chief Complaint  Patient presents with  . Shortness of Breath    (Consider location/radiation/quality/duration/timing/severity/associated sxs/prior treatment) HPI Comments: Pt presenting to the ED for asthma exacerbation and wheezing.  States over the past 2 days she has been having to use her albuterol nebs every 4 hours which is increased from her baseline- normally only uses it 1-2 times weekly.  Has been in and out of hospitals the past 2 weeks visiting relatives so has had multiple sick contacts.  Denies any chest pain, cough, fever, chills, or sweats.  Currently out of her albuterol rescue inhaler.  O2 sats 100% on room air.  The history is provided by the patient.    Past Medical History  Diagnosis Date  . Asthma     History reviewed. No pertinent past surgical history.  No family history on file.  History  Substance Use Topics  . Smoking status: Current Some Day Smoker  . Smokeless tobacco: Not on file  . Alcohol Use: Yes     Comment: social    OB History   Grav Para Term Preterm Abortions TAB SAB Ect Mult Living                  Review of Systems  Respiratory: Positive for wheezing.   All other systems reviewed and are negative.    Allergies  Review of patient's allergies indicates no known allergies.  Home Medications   Current Outpatient Rx  Name  Route  Sig  Dispense  Refill  . albuterol (PROVENTIL HFA;VENTOLIN HFA) 108 (90 BASE) MCG/ACT inhaler   Inhalation   Inhale 2 puffs into the lungs every 6 (six) hours as needed. For shortness of breath         . albuterol (PROVENTIL) (5 MG/ML) 0.5% nebulizer solution   Nebulization   Take 0.5 mLs (2.5 mg total) by nebulization every 6 (six) hours as needed for wheezing.   20 mL   12   . cephALEXin (KEFLEX) 250 MG capsule   Oral   Take 2 capsules (500 mg total) by mouth 2  (two) times daily.   14 capsule   0   . sulfamethoxazole-trimethoprim (SEPTRA DS) 800-160 MG per tablet   Oral   Take 1 tablet by mouth 2 (two) times daily.   14 tablet   0   . traMADol (ULTRAM) 50 MG tablet   Oral   Take 1 tablet (50 mg total) by mouth every 6 (six) hours as needed for pain.   15 tablet   0     BP 121/88  Pulse 82  Temp(Src) 98.1 F (36.7 C) (Oral)  Resp 20  SpO2 100%  LMP 11/22/2012  Physical Exam  Nursing note and vitals reviewed. Constitutional: She is oriented to person, place, and time. She appears well-developed and well-nourished.  HENT:  Head: Normocephalic and atraumatic.  Mouth/Throat: Uvula is midline, oropharynx is clear and moist and mucous membranes are normal. No edematous. No oropharyngeal exudate, posterior oropharyngeal edema, posterior oropharyngeal erythema or tonsillar abscesses.  Eyes: Conjunctivae and EOM are normal. Pupils are equal, round, and reactive to light.  Neck: Normal range of motion. Neck supple.  Cardiovascular: Normal rate, regular rhythm and normal heart sounds.   Pulmonary/Chest: Effort normal. No accessory muscle usage. No respiratory distress. She has wheezes (diffuse). She has no  rhonchi.  Neurological: She is alert and oriented to person, place, and time.  Skin: Skin is warm and dry.  Psychiatric: She has a normal mood and affect.    ED Course  Procedures (including critical care time)  Labs Reviewed - No data to display Dg Chest 2 View  12/14/2012  *RADIOLOGY REPORT*  Clinical Data:  Shortness of breath, cough and chest tightness.  CHEST - 2 VIEW  Comparison: 03/29/2008  Findings: The heart size and mediastinal contours are within normal limits.  Both lungs are clear.  The visualized skeletal structures are unremarkable.  IMPRESSION: No active disease.   Original Report Authenticated By: Irish Lack, M.D.      1. Asthma exacerbation   2. Wheezing       MDM   Pt in ED for asthma exacerbation and  wheezing.  Has been having to use neb txs at home q4h which is increased from baseline.  CXR clear.  Albuterol/atrovent neb and decadron given in the ED with good resolution of sx.  Rx prednisone few days starting tomorrow and albuterol inhaler.  Continue with home nebs as needed.  Discussed plan with pt- she agreed.  Return precautions advised.        Garlon Hatchet, PA-C 12/14/12 2245

## 2012-12-15 NOTE — ED Provider Notes (Signed)
Medical screening examination/treatment/procedure(s) were performed by non-physician practitioner and as supervising physician I was immediately available for consultation/collaboration.   Carleene Cooper III, MD 12/15/12 [phone]

## 2013-01-03 ENCOUNTER — Emergency Department (HOSPITAL_COMMUNITY): Payer: Medicaid Other

## 2013-01-03 ENCOUNTER — Encounter (HOSPITAL_COMMUNITY): Payer: Self-pay | Admitting: Physical Medicine and Rehabilitation

## 2013-01-03 ENCOUNTER — Inpatient Hospital Stay (HOSPITAL_COMMUNITY)
Admission: EM | Admit: 2013-01-03 | Discharge: 2013-01-05 | DRG: 194 | Disposition: A | Discharge status: Home / Self Care | Payer: Medicaid Other | Attending: Internal Medicine | Admitting: Internal Medicine

## 2013-01-03 DIAGNOSIS — R000 Tachycardia, unspecified: Secondary | ICD-10-CM | POA: Diagnosis present

## 2013-01-03 DIAGNOSIS — F17200 Nicotine dependence, unspecified, uncomplicated: Secondary | ICD-10-CM | POA: Diagnosis present

## 2013-01-03 DIAGNOSIS — J45901 Unspecified asthma with (acute) exacerbation: Secondary | ICD-10-CM | POA: Diagnosis present

## 2013-01-03 DIAGNOSIS — J189 Pneumonia, unspecified organism: Principal | ICD-10-CM | POA: Diagnosis present

## 2013-01-03 DIAGNOSIS — Z79899 Other long term (current) drug therapy: Secondary | ICD-10-CM

## 2013-01-03 LAB — CBC WITH DIFFERENTIAL/PLATELET
Basophils Absolute: 0 10*3/uL (ref 0.0–0.1)
Basophils Relative: 0 % (ref 0–1)
Eosinophils Absolute: 0.5 10*3/uL (ref 0.0–0.7)
Eosinophils Relative: 6 % — ABNORMAL HIGH (ref 0–5)
HCT: 41.2 % (ref 36.0–46.0)
MCHC: 35.9 g/dL (ref 30.0–36.0)
MCV: 88.4 fL (ref 78.0–100.0)
Monocytes Absolute: 0.5 10*3/uL (ref 0.1–1.0)
RDW: 14.2 % (ref 11.5–15.5)

## 2013-01-03 LAB — BASIC METABOLIC PANEL
Calcium: 9.2 mg/dL (ref 8.4–10.5)
Creatinine, Ser: 0.88 mg/dL (ref 0.50–1.10)
GFR calc Af Amer: >90 mL/min (ref >90)
GFR calc non Af Amer: 83 mL/min — ABNORMAL LOW (ref >90)

## 2013-01-03 LAB — D-DIMER, QUANTITATIVE: D-Dimer, Quant: 0.28 ug/mL-FEU (ref 0.00–0.48)

## 2013-01-03 MED ORDER — DEXAMETHASONE SODIUM PHOSPHATE 10 MG/ML IJ SOLN
10.0000 mg | Freq: Once | INTRAMUSCULAR | Status: AC
  Administered 2013-01-03: 10 mg via INTRAVENOUS
  Filled 2013-01-03: qty 1

## 2013-01-03 MED ORDER — IPRATROPIUM BROMIDE 0.02 % IN SOLN
0.5000 mg | Freq: Once | RESPIRATORY_TRACT | Status: AC
  Administered 2013-01-03: 0.5 mg via RESPIRATORY_TRACT
  Filled 2013-01-03: qty 2.5

## 2013-01-03 MED ORDER — AZITHROMYCIN 250 MG PO TABS
500.0000 mg | ORAL_TABLET | Freq: Once | ORAL | Status: AC
  Administered 2013-01-03: 500 mg via ORAL
  Filled 2013-01-03: qty 2

## 2013-01-03 MED ORDER — IPRATROPIUM BROMIDE 0.02 % IN SOLN
0.5000 mg | RESPIRATORY_TRACT | Status: AC
  Administered 2013-01-03: 0.5 mg via RESPIRATORY_TRACT
  Filled 2013-01-03: qty 2.5

## 2013-01-03 MED ORDER — DEXTROSE 5 % IV SOLN
1.0000 g | Freq: Once | INTRAVENOUS | Status: AC
  Administered 2013-01-03: 1 g via INTRAVENOUS
  Filled 2013-01-03: qty 10

## 2013-01-03 MED ORDER — ALBUTEROL SULFATE (5 MG/ML) 0.5% IN NEBU
INHALATION_SOLUTION | RESPIRATORY_TRACT | Status: AC
  Filled 2013-01-03: qty 1

## 2013-01-03 MED ORDER — ALBUTEROL SULFATE (5 MG/ML) 0.5% IN NEBU
5.0000 mg | INHALATION_SOLUTION | Freq: Once | RESPIRATORY_TRACT | Status: AC
  Administered 2013-01-03: 5 mg via RESPIRATORY_TRACT

## 2013-01-03 MED ORDER — IPRATROPIUM BROMIDE 0.02 % IN SOLN
RESPIRATORY_TRACT | Status: AC
  Filled 2013-01-03: qty 5

## 2013-01-03 MED ORDER — ALBUTEROL (5 MG/ML) CONTINUOUS INHALATION SOLN
10.0000 mg/h | INHALATION_SOLUTION | RESPIRATORY_TRACT | Status: DC
  Administered 2013-01-03: 10 mg/h via RESPIRATORY_TRACT
  Filled 2013-01-03 (×2): qty 20

## 2013-01-03 MED ORDER — SODIUM CHLORIDE 0.9 % IV SOLN
INTRAVENOUS | Status: DC
  Administered 2013-01-03: 16:00:00 via INTRAVENOUS

## 2013-01-03 MED ORDER — OXYCODONE-ACETAMINOPHEN 5-325 MG PO TABS
2.0000 | ORAL_TABLET | Freq: Once | ORAL | Status: AC
  Administered 2013-01-03: 2 via ORAL
  Filled 2013-01-03: qty 2

## 2013-01-03 MED ORDER — ALBUTEROL SULFATE (5 MG/ML) 0.5% IN NEBU
5.0000 mg | INHALATION_SOLUTION | Freq: Once | RESPIRATORY_TRACT | Status: AC
  Administered 2013-01-03: 5 mg via RESPIRATORY_TRACT
  Filled 2013-01-03: qty 1

## 2013-01-03 MED ORDER — SODIUM CHLORIDE 0.9 % IV BOLUS (SEPSIS)
500.0000 mL | Freq: Once | INTRAVENOUS | Status: AC
  Administered 2013-01-03: 500 mL via INTRAVENOUS

## 2013-01-03 MED ORDER — PREDNISONE 20 MG PO TABS: 60.0000 mg | ORAL_TABLET | Freq: Once | ORAL | Status: DC

## 2013-01-03 MED ORDER — ALBUTEROL (5 MG/ML) CONTINUOUS INHALATION SOLN
10.0000 mg/h | INHALATION_SOLUTION | RESPIRATORY_TRACT | Status: AC
  Administered 2013-01-03: 10 mg/h via RESPIRATORY_TRACT

## 2013-01-03 NOTE — ED Notes (Signed)
Pt continues to state she feels like she can not breathe, 02 sats remain 95-98 on RA, pt able to speak in full sentences, edpa aware

## 2013-01-03 NOTE — ED Notes (Signed)
RT started continuous Albuterol 10mg / Atrovent 0.5mg  neb tx after pt performed peak flow.  RT communicated with RN that tx had been started and RN to notify RT of any problems and would take tx off when finished.  Pt and family were very pleasant and appreciative of tx.

## 2013-01-03 NOTE — ED Provider Notes (Signed)
Medical screening examination/treatment/procedure(s) were performed by non-physician practitioner and as supervising physician I was immediately available for consultation/collaboration.   Zandria Woldt B. Bernette Mayers, MD 01/03/13 1451

## 2013-01-03 NOTE — ED Notes (Signed)
Pt to room from fast tract very anxious, does not want to sit down and she can't breath. Pt placed on 3L [place] and O2 100%.
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
## 2013-01-03 NOTE — ED Provider Notes (Signed)
History     CSN: [id number]  Arrival date & time 01/03/13  1237   First MD Initiated Contact with Patient 01/03/13 1352      Chief Complaint  Patient presents with  . Shortness of Breath    (Consider location/radiation/quality/duration/timing/severity/associated sxs/prior treatment) HPI Comments: Taylor Wilkinson is a 37 y.o. female who complains of shortness of breath, worsening over one week. She is using her albuterol inhaler, without relief. She has also used her nebulizer, a few times. She does not have a primary care doctor. She gets all of her medicines from the emergency department. She states that she stopped smoking 3 weeks ago. She feels that the wheezing is more left-sided. She has pain in her left. She gets more out of breath with exertion. There is no change with body position. She denies nausea, vomiting, weakness, or dizziness. She has mild diffuse back pain. She drove her vehicle here to be evaluated. She denies fever or chills, cough, change in bowel or urinary habits. The patient was seen initially in the fast track of the emergency department today. She was treated there with 2 nebulizers of albuterol, but states that they did not help. There are no other known modifying factors.  Patient is a 37 y.o. female presenting with shortness of breath. The history is provided by the patient.  Shortness of Breath   Past Medical History  Diagnosis Date  . Asthma     No past surgical history on file.  No family history on file.  History  Substance Use Topics  . Smoking status: Current Some Day Smoker  . Smokeless tobacco: Not on file  . Alcohol Use: Yes     Comment: social    OB History   Grav Para Term Preterm Abortions TAB SAB Ect Mult Living                  Review of Systems  Respiratory: Positive for shortness of breath.   All other systems reviewed and are negative.    Allergies  Review of patient's allergies indicates no known allergies.  Home  Medications   Current Outpatient Rx  Name  Route  Sig  Dispense  Refill  . albuterol (PROVENTIL HFA;VENTOLIN HFA) 108 (90 BASE) MCG/ACT inhaler   Inhalation   Inhale 2 puffs into the lungs every 6 (six) hours as needed. For shortness of breath         . albuterol (PROVENTIL) (5 MG/ML) 0.5% nebulizer solution   Nebulization   Take 0.5 mLs (2.5 mg total) by nebulization every 6 (six) hours as needed for wheezing.   20 mL   12   . dextromethorphan-guaiFENesin (MUCINEX DM) 30-600 MG per 12 hr tablet   Oral   Take 1 tablet by mouth every 12 (twelve) hours.           BP 162/105  Pulse 130  Temp(Src) 98.7 F (37.1 C) (Oral)  Resp 20  SpO2 98%  LMP 11/13/2012  Physical Exam  Nursing note and vitals reviewed. Constitutional: She is oriented to person, place, and time. She appears well-developed and well-nourished.  HENT:  Head: Normocephalic and atraumatic.  Eyes: Conjunctivae and EOM are normal. Pupils are equal, round, and reactive to light.  Neck: Normal range of motion and phonation normal. Neck supple.  Cardiovascular: Normal rate, regular rhythm and intact distal pulses.   Pulmonary/Chest: She is in respiratory distress (Mild tachypnea. She is resting supine in the right lateral decubitus position.). She has wheezes (  generalized). She has no rales. She exhibits no tenderness.  Decreased air movement, bilaterally. No audible stridor.  Abdominal: Soft. She exhibits no distension. There is no tenderness. There is no guarding.  Musculoskeletal: Normal range of motion.  Neurological: She is alert and oriented to person, place, and time. She has normal strength. She exhibits normal muscle tone.  Skin: Skin is warm and dry.  Psychiatric: She has a normal mood and affect. Her behavior is normal. Judgment and thought content normal.    ED Course  Procedures (including critical care time)  Patient feels that she did not improve with initial treatment, at fast track; although,  the respiratory therapist to evaluate her felt that she did.  Medications  0.9 %  sodium chloride infusion ( Intravenous New Bag/Given 01/03/13 1605)  albuterol (PROVENTIL,VENTOLIN) solution continuous neb (10 mg/hr Nebulization New Bag/Given 01/03/13 1556)  albuterol (PROVENTIL,VENTOLIN) solution continuous neb (0 mg/hr Nebulization Stopped 01/03/13 2312)  dexamethasone (DECADRON) injection 10 mg (not administered)  cefTRIAXone (ROCEPHIN) 1 g in dextrose 5 % 50 mL IVPB (not administered)  albuterol (PROVENTIL) (5 MG/ML) 0.5% nebulizer solution 5 mg (5 mg Nebulization Given 01/03/13 1247)  albuterol (PROVENTIL) (5 MG/ML) 0.5% nebulizer solution 5 mg (5 mg Nebulization Given 01/03/13 1404)  ipratropium (ATROVENT) nebulizer solution 0.5 mg (0.5 mg Nebulization Given 01/03/13 1429)  dexamethasone (DECADRON) injection 10 mg (10 mg Intravenous Given 01/03/13 1429)  albuterol (PROVENTIL) (5 MG/ML) 0.5% nebulizer solution 5 mg (5 mg Nebulization Given 01/03/13 1429)  sodium chloride 0.9 % bolus 500 mL (0 mLs Intravenous Stopped 01/03/13 2058)  ipratropium (ATROVENT) nebulizer solution 0.5 mg (0.5 mg Nebulization Given 01/03/13 1556)  oxyCODONE-acetaminophen (PERCOCET/ROXICET) 5-325 MG per tablet 2 tablet (2 tablets Oral Given 01/03/13 2115)  azithromycin (ZITHROMAX) tablet 500 mg (500 mg Oral Given 01/03/13 2115)    Patient Vitals for the past 24 hrs:  BP Temp Temp src Pulse Resp SpO2  01/03/13 2106 - - - - - 98 %  01/03/13 2100 162/105 mmHg - - 130 - 97 %  01/03/13 2008 - - - - - 97 %  01/03/13 1900 116/88 mmHg - - 116 - 96 %  01/03/13 1709 129/74 mmHg 98.7 F (37.1 C) Oral 107 - 96 %  01/03/13 1616 131/90 mmHg 98.4 F (36.9 C) Oral 110 20 99 %  01/03/13 1615 - - - 108 - 100 %  01/03/13 1600 - - - 95 - 100 %  01/03/13 1556 - - - - - 95 %  01/03/13 1530 - - - 101 - 100 %  01/03/13 1500 - - - 109 - 100 %  01/03/13 1415 - - - 110 - 99 %  01/03/13 1406 - - - - - 100 %  01/03/13 1405 - - - - 24 96 %   01/03/13 1309 134/89 mmHg 97.5 F (36.4 C) Oral 99 22 96 %  01/03/13 1238 133/87 mmHg 99 F (37.2 C) Oral 111 24 92 %    Reeval.: 23:10- she feels better, at this time. Lungs have decreased a wheezing as compared with prior evaluation. Heart rate 1:15 to 120 while supine. When sitting, heart rate goes to 130. Her symptoms do not change when sitting.  Consultation: 11:15- Hospitalist called  to arrange admission;     CRITICAL CARE Performed by: Flint Melter   Total critical care time: 40 minutes Critical care time was exclusive of separately billable procedures and treating other patients.  Critical care was necessary to treat or prevent imminent or  life-threatening deterioration.  Critical care was time spent personally by me on the following activities: development of treatment plan with patient and/or surrogate as well as nursing, discussions with consultants, evaluation of patient's response to treatment, examination of patient, obtaining history from patient or surrogate, ordering and performing treatments and interventions, ordering and review of laboratory studies, ordering and review of radiographic studies, pulse oximetry and re-evaluation of patient's condition.  Labs Reviewed  CBC WITH DIFFERENTIAL - Abnormal; Notable for the following:    Eosinophils Relative 6 (*)    All other components within normal limits  BASIC METABOLIC PANEL - Abnormal; Notable for the following:    Glucose, Bld 101 (*)    GFR calc non Af Amer 83 (*)    All other components within normal limits  D-DIMER, QUANTITATIVE   Dg Chest 2 View  01/03/2013  *RADIOLOGY REPORT*  Clinical Data: Short of breath.  CHEST - 2 VIEW  Comparison: 12/14/2012  Findings: Small patchy infiltrate in the right middle lobe, suspicious for pneumonia.  No pleural effusion.  Negative for heart failure.  No mass lesion.  IMPRESSION: Possible mild pneumonia in the right middle lobe.   Original Report Authenticated By: Janeece Riggers, M.D.      1. Community acquired pneumonia   2. Tachycardia       MDM  Dyspnea with wheezing and associated right middle lobe pneumonia. Patient has failed treatment in the ED, and has persistent tachycardia and wheezing. She will require admission for stabilization.  Nursing Notes Reviewed/ Care Coordinated, and agree without changes. Applicable Imaging Reviewed.  Interpretation of Laboratory Data incorporated into ED treatment   Plan: Admit        Flint Melter, MD 01/04/13 1626

## 2013-01-03 NOTE — ED Notes (Signed)
Pt presents to department for evaluation of SOB. Ongoing x1 week. Also states cough and congestion. Audible wheezing upon arrival to ED. She is alert and oriented x4.

## 2013-01-03 NOTE — ED Provider Notes (Signed)
MSE was initiated and I personally evaluated the patient and placed orders (if any) at  2:37 PM on January 03, 2013.  The patient appears stable so that the remainder of the MSE may be completed by another provider.  She presents with increased asthma, significant shortness of breath. Nebulizers at home are not working. No fever.   Marked wheezing on inspiration and expiration after HHN x 2. Tachypnic, tachycardic. Tearful.  She is agitated, reports nebulizers are not improving symptoms.  Will move out of fast track to exam room for further evaluation and management.  Arnoldo Hooker, PA-C 01/03/13 1441

## 2013-01-04 ENCOUNTER — Encounter (HOSPITAL_COMMUNITY): Payer: Self-pay | Admitting: Internal Medicine

## 2013-01-04 DIAGNOSIS — R000 Tachycardia, unspecified: Secondary | ICD-10-CM

## 2013-01-04 DIAGNOSIS — J45901 Unspecified asthma with (acute) exacerbation: Secondary | ICD-10-CM | POA: Diagnosis present

## 2013-01-04 DIAGNOSIS — J189 Pneumonia, unspecified organism: Principal | ICD-10-CM

## 2013-01-04 LAB — BASIC METABOLIC PANEL
CO2: 20 mEq/L (ref 19–32)
Calcium: 9.1 mg/dL (ref 8.4–10.5)
Creatinine, Ser: 0.61 mg/dL (ref 0.50–1.10)
Glucose, Bld: 128 mg/dL — ABNORMAL HIGH (ref 70–99)

## 2013-01-04 LAB — CBC
MCH: 30.7 pg (ref 26.0–34.0)
MCV: 87.7 fL (ref 78.0–100.0)
Platelets: 228 10*3/uL (ref 150–400)
RDW: 14.6 % (ref 11.5–15.5)

## 2013-01-04 MED ORDER — SODIUM CHLORIDE 0.9 % IV SOLN: INTRAVENOUS | Status: DC

## 2013-01-04 MED ORDER — METHYLPREDNISOLONE SODIUM SUCC 125 MG IJ SOLR
125.0000 mg | Freq: Four times a day (QID) | INTRAMUSCULAR | Status: DC
  Administered 2013-01-04: 125 mg via INTRAVENOUS
  Filled 2013-01-04 (×4): qty 2

## 2013-01-04 MED ORDER — METHYLPREDNISOLONE SODIUM SUCC 125 MG IJ SOLR
125.0000 mg | Freq: Two times a day (BID) | INTRAMUSCULAR | Status: DC
  Administered 2013-01-04 – 2013-01-05 (×2): 125 mg via INTRAVENOUS
  Filled 2013-01-04 (×4): qty 2

## 2013-01-04 MED ORDER — IPRATROPIUM BROMIDE 0.02 % IN SOLN
0.5000 mg | Freq: Four times a day (QID) | RESPIRATORY_TRACT | Status: DC
  Administered 2013-01-04 – 2013-01-05 (×7): 0.5 mg via RESPIRATORY_TRACT
  Filled 2013-01-04 (×7): qty 2.5

## 2013-01-04 MED ORDER — HYDROMORPHONE HCL PF 1 MG/ML IJ SOLN: 0.5000 mg | INTRAMUSCULAR | Status: DC | PRN

## 2013-01-04 MED ORDER — ONDANSETRON HCL 4 MG/2ML IJ SOLN: 4.0000 mg | Freq: Four times a day (QID) | INTRAMUSCULAR | Status: DC | PRN

## 2013-01-04 MED ORDER — ALBUTEROL SULFATE (5 MG/ML) 0.5% IN NEBU: 2.5000 mg | INHALATION_SOLUTION | RESPIRATORY_TRACT | Status: DC | PRN

## 2013-01-04 MED ORDER — ONDANSETRON HCL 4 MG PO TABS: 4.0000 mg | ORAL_TABLET | Freq: Four times a day (QID) | ORAL | Status: DC | PRN

## 2013-01-04 MED ORDER — DEXTROSE 5 % IV SOLN
1.0000 g | INTRAVENOUS | Status: DC
  Administered 2013-01-04: 1 g via INTRAVENOUS
  Filled 2013-01-04 (×2): qty 10

## 2013-01-04 MED ORDER — ZOLPIDEM TARTRATE 5 MG PO TABS: 5.0000 mg | ORAL_TABLET | Freq: Every evening | ORAL | Status: DC | PRN

## 2013-01-04 MED ORDER — SODIUM CHLORIDE 0.9 % IV SOLN
INTRAVENOUS | Status: AC
  Administered 2013-01-04: 03:00:00 via INTRAVENOUS

## 2013-01-04 MED ORDER — OXYCODONE HCL 5 MG PO TABS
5.0000 mg | ORAL_TABLET | ORAL | Status: DC | PRN
  Administered 2013-01-04 (×2): 5 mg via ORAL
  Filled 2013-01-04 (×2): qty 1

## 2013-01-04 MED ORDER — DM-GUAIFENESIN ER 30-600 MG PO TB12
1.0000 | ORAL_TABLET | Freq: Two times a day (BID) | ORAL | Status: DC
  Administered 2013-01-04 – 2013-01-05 (×4): 1 via ORAL
  Filled 2013-01-04 (×5): qty 1

## 2013-01-04 MED ORDER — ALUM & MAG HYDROXIDE-SIMETH 200-200-20 MG/5ML PO SUSP: 30.0000 mL | Freq: Four times a day (QID) | ORAL | Status: DC | PRN

## 2013-01-04 MED ORDER — ACETAMINOPHEN 650 MG RE SUPP: 650.0000 mg | Freq: Four times a day (QID) | RECTAL | Status: DC | PRN

## 2013-01-04 MED ORDER — AZITHROMYCIN 500 MG PO TABS
500.0000 mg | ORAL_TABLET | Freq: Every day | ORAL | Status: DC
  Administered 2013-01-04 – 2013-01-05 (×2): 500 mg via ORAL
  Filled 2013-01-04 (×2): qty 1

## 2013-01-04 MED ORDER — ACETAMINOPHEN 325 MG PO TABS
650.0000 mg | ORAL_TABLET | Freq: Four times a day (QID) | ORAL | Status: DC | PRN
  Administered 2013-01-04: 650 mg via ORAL
  Filled 2013-01-04: qty 2

## 2013-01-04 MED ORDER — ALBUTEROL SULFATE (5 MG/ML) 0.5% IN NEBU
2.5000 mg | INHALATION_SOLUTION | Freq: Four times a day (QID) | RESPIRATORY_TRACT | Status: DC
  Administered 2013-01-04 – 2013-01-05 (×7): 2.5 mg via RESPIRATORY_TRACT
  Filled 2013-01-04 (×7): qty 0.5

## 2013-01-04 MED ORDER — ENOXAPARIN SODIUM 40 MG/0.4ML ~~LOC~~ SOLN
40.0000 mg | SUBCUTANEOUS | Status: DC
  Administered 2013-01-04: 40 mg via SUBCUTANEOUS
  Filled 2013-01-04 (×2): qty 0.4

## 2013-01-04 NOTE — Progress Notes (Signed)
Utilization review completed.  

## 2013-01-04 NOTE — ED Notes (Addendum)
Report to 75 RN - Merlyn Albert, RN and updated re: outstanding meds for administration

## 2013-01-04 NOTE — H&P (Signed)
[facility] History and Physical  Taylor Wilkinson ZOX:[id number] DOB: 21-May-1976 DOA: 01/03/2013  Referring physician: EDP PCP: No PCP Per Patient  Specialists:   Chief Complaint: SOB and Wheezing  HPI: Taylor Wilkinson is a 37 y.o. female  Who presented to the ED with complaints of worsening SOB and wheezing over the past 3 days along with increased cough.   She denies having fevers or chills, but reports having sweats.   In the ED, she was evaluated and administered multiple neb treatements with minimal improvement.  And a Chest X-ray reveal a RML Pneumonia.   She was placed on antibiotic therapy to cover CAP and referred for admission.      Review of Systems: The patient denies anorexia, fever, chills, headaches, weight loss, vision loss, decreased hearing, hoarseness, chest pain, syncope, dyspnea on exertion, peripheral edema, balance deficits, hemoptysis, abdominal pain, nausea, vomiting, diarrhea, hematemesis, melena, hematochezia, severe indigestion/heartburn, hematuria, incontinence, muscle weakness, suspicious skin lesions, transient blindness, difficulty walking, depression, unusual weight change, abnormal bleeding, enlarged lymph nodes, angioedema, and breast masses.    Past Medical History  Diagnosis Date  . Asthma      No past surgical history on file.    Medications:  HOME MEDS: Prior to Admission medications   Medication Sig Start Date End Date Taking? Authorizing Provider  albuterol (PROVENTIL HFA;VENTOLIN HFA) 108 (90 BASE) MCG/ACT inhaler Inhale 2 puffs into the lungs every 6 (six) hours as needed. For shortness of breath   Yes Linna Hoff, MD  albuterol (PROVENTIL) (5 MG/ML) 0.5% nebulizer solution Take 0.5 mLs (2.5 mg total) by nebulization every 6 (six) hours as needed for wheezing. 03/28/12 03/28/13 Yes Gerhard Munch, MD  dextromethorphan-guaiFENesin [facility] DM) 30-600 MG per 12 hr tablet Take 1 tablet by mouth every 12 (twelve) hours.   Yes  Historical Provider, MD     Allergies:  No Known Allergies    Social History:   reports that she has been smoking.  She does not have any smokeless tobacco history on file. She reports that  drinks alcohol. She reports that she uses illicit drugs (Marijuana).    Family History: Family History  Problem Relation Age of Onset  . Hypertension Mother   . Diabetes Mother      Physical Exam:  GEN:  Pleasant   37 year old well nourished and well developed African American Female examined  and in no acute distress; cooperative with exam Filed Vitals:   01/03/13 2100 01/03/13 2106 01/03/13 2300 01/03/13 2330  BP: 162/105  112/63 116/77  Pulse: 130  115 111  Temp:      TempSrc:      Resp:   24 18  SpO2: 97% 98% 94% 97%   Blood pressure 116/77, pulse 111, temperature 98.7 F (37.1 C), temperature source Oral, resp. rate 18, last menstrual period 11/13/2012, SpO2 97.00%. PSYCH: She is alert and oriented x4; does not appear anxious does not appear depressed; affect is normal HEENT: Normocephalic and Atraumatic, Mucous membranes pink; PERRLA; EOM intact; Fundi:  Benign;  No scleral icterus, Nares: Patent, Oropharynx: Clear, Fair Dentition, Neck:  FROM, no cervical lymphadenopathy nor thyromegaly or carotid bruit; no JVD; Breasts:: Not examined CHEST WALL: No tenderness CHEST: Normal respiration, Diffuse Expiratory Wheezes, Occasional Rhonchi HEART: Regular rate and rhythm; no murmurs rubs or gallops BACK: No kyphosis or scoliosis; no CVA tenderness ABDOMEN: Positive Bowel Sounds,  soft non-tender; no masses, no organomegaly. Rectal Exam: Not done EXTREMITIES: No cyanosis, clubbing or edema;  no ulcerations. Genitalia: not examined PULSES: 2+ and symmetric SKIN: Normal hydration no rash or ulceration CNS: Cranial nerves 2-12 grossly intact no focal neurologic deficit     Labs & Imaging Results for orders placed during the hospital encounter of 01/03/13 (from the past 48 hour(s))   CBC WITH DIFFERENTIAL     Status: Abnormal   Collection Time    01/03/13  3:37 PM      Result Value Range   WBC 9.3  4.0 - 10.5 K/uL   RBC 4.66  3.87 - 5.11 MIL/uL   Hemoglobin 14.8  12.0 - 15.0 g/dL   HCT 86.5  78.4 - 69.6 %   MCV 88.4  78.0 - 100.0 fL   MCH 31.8  26.0 - 34.0 pg   MCHC 35.9  30.0 - 36.0 g/dL   RDW 29.5  28.4 - 13.2 %   Platelets 224  150 - 400 K/uL   Neutrophils Relative 57  43 - 77 %   Neutro Abs 5.3  1.7 - 7.7 K/uL   Lymphocytes Relative 32  12 - 46 %   Lymphs Abs 3.0  0.7 - 4.0 K/uL   Monocytes Relative 5  3 - 12 %   Monocytes Absolute 0.5  0.1 - 1.0 K/uL   Eosinophils Relative 6 (*) 0 - 5 %   Eosinophils Absolute 0.5  0.0 - 0.7 K/uL   Basophils Relative 0  0 - 1 %   Basophils Absolute 0.0  0.0 - 0.1 K/uL  BASIC METABOLIC PANEL     Status: Abnormal   Collection Time    01/03/13  3:37 PM      Result Value Range   Sodium 139  135 - 145 mEq/L   Potassium 3.5  3.5 - 5.1 mEq/L   Chloride 105  96 - 112 mEq/L   CO2 24  19 - 32 mEq/L   Glucose, Bld 101 (*) 70 - 99 mg/dL   BUN 10  6 - 23 mg/dL   Creatinine, Ser 4.40  0.50 - 1.10 mg/dL   Calcium 9.2  8.4 - 10.2 mg/dL   GFR calc non Af Amer 83 (*) >90 mL/min   GFR calc Af Amer >90  >90 mL/min   Comment:            The eGFR has been calculated     using the CKD EPI equation.     This calculation has not been     validated in all clinical     situations.     eGFR's persistently     <90 mL/min signify     possible Chronic Kidney Disease.  D-DIMER, QUANTITATIVE     Status: None   Collection Time    01/03/13  3:37 PM      Result Value Range   D-Dimer, Quant 0.28  0.00 - 0.48 ug/mL-FEU   Comment:            AT THE INHOUSE ESTABLISHED CUTOFF     VALUE OF 0.48 ug/mL FEU,     THIS ASSAY HAS BEEN DOCUMENTED     IN THE LITERATURE TO HAVE     A SENSITIVITY AND NEGATIVE     PREDICTIVE VALUE OF AT LEAST     98 TO 99%.  THE TEST RESULT     SHOULD BE CORRELATED WITH     AN ASSESSMENT OF THE CLINICAL      PROBABILITY OF DVT / VTE.     Radiological Exams on  Admission: Dg Chest 2 View  01/03/2013  *RADIOLOGY REPORT*  Clinical Data: Short of breath.  CHEST - 2 VIEW  Comparison: 12/14/2012  Findings: Small patchy infiltrate in the right middle lobe, suspicious for pneumonia.  No pleural effusion.  Negative for heart failure.  No mass lesion.  IMPRESSION: Possible mild pneumonia in the right middle lobe.   Original Report Authenticated By: Janeece Riggers, M.D.       Assessment/Plan Principal Problem:   CAP (community acquired pneumonia) Active Problems:   Asthma exacerbation   Tachycardia    1.   CAP-   Placed on IV Rocephin and PO Azithromycin,  Albuterol and Atrovent Nebs, O2 PRN and High dose IV Steroid taper.     2.   Asthma Exacerbation-  Clinically consistent with Asthma, though no Official diagnosis,  Nebs and IV Steroid taper.    3.   Tachycardia-  Most likely due to Albuterol Nebs, Monitor.     4.   DVT Prophylaxis-   With Lovenox.       Code Status:  FULL CODE Family Communication:    Family at Bedside Disposition Plan:   Return to Home  Time spent: 71 Minutes  Ron Parker [facility] Pager [phone]  If 7PM-7AM, please contact night-coverage [email or web address] Password [facility] 01/04/2013, 12:37 AM

## 2013-01-04 NOTE — Progress Notes (Signed)
[facility] PROGRESS NOTE  DYLIN IHNEN ZOX:[id number] DOB: Sep 17, 1975 DOA: 01/03/2013 PCP: No PCP Per Patient  Assessment/Plan: 1. CAP - mild - no leukocytosis . No signs of respiratory failure, pleural effusion, sepsis. Continue current IV antibiotics. 2. Asthma exacerbation- upon presentation to the emergency room the patient was having severe wheezing, hypoxia, dyspnea with minimal exertion. She received numerous albuterol treatments, IV steroids and she has improved very slowly by April 27. We'll start tapering steroids  HPI/Subjective: Complains of dyspnea with minimal exertion  Objective: Filed Vitals:   01/04/13 0254 01/04/13 0304 01/04/13 0500 01/04/13 0741  BP:  137/90 104/72   Pulse: 100 112 100   Temp:  98.2 F (36.8 C)    TempSrc:  Oral    Resp: 18 18 20    Height:  5\' 6"  (1.676 m)    Weight:  91.445 kg (201 lb 9.6 oz)    SpO2:  98% 98% 98%   Patient Vitals for the past 24 hrs:  BP Temp Temp src Pulse Resp SpO2 Height Weight  01/04/13 0741 - - - - - 98 % - -  01/04/13 0500 104/72 mmHg - - 100 20 98 % - -  01/04/13 0304 137/90 mmHg 98.2 F (36.8 C) Oral 112 18 98 % 5\' 6"  (1.676 m) 91.445 kg (201 lb 9.6 oz)  01/04/13 0254 - - - 100 18 - - -  01/04/13 0123 - 97.5 F (36.4 C) Oral - - - - -  01/04/13 0031 - - - - - 95 % - -  01/04/13 0000 114/72 mmHg - - 109 17 95 % - -  01/03/13 2330 116/77 mmHg - - 111 18 97 % - -  01/03/13 2300 112/63 mmHg - - 115 24 94 % - -  01/03/13 2106 - - - - - 98 % - -  01/03/13 2100 162/105 mmHg - - 130 - 97 % - -  01/03/13 2008 - - - - - 97 % - -  01/03/13 1900 116/88 mmHg - - 116 - 96 % - -  01/03/13 1709 129/74 mmHg 98.7 F (37.1 C) Oral 107 - 96 % - -  01/03/13 1616 131/90 mmHg 98.4 F (36.9 C) Oral 110 20 99 % - -  01/03/13 1615 - - - 108 - 100 % - -  01/03/13 1600 - - - 95 - 100 % - -  01/03/13 1556 - - - - - 95 % - -  01/03/13 1530 - - - 101 - 100 % - -  01/03/13 1500 - - - 109 - 100 % - -  01/03/13 1415 - - - 110  - 99 % - -  01/03/13 1406 - - - - - 100 % - -  01/03/13 1405 - - - - 24 96 % - -  01/03/13 1309 134/89 mmHg 97.5 F (36.4 C) Oral 99 22 96 % - -  01/03/13 1238 133/87 mmHg 99 F (37.2 C) Oral 111 24 92 % - -     Intake/Output Summary (Last 24 hours) at 01/04/13 1127 Last data filed at 01/04/13 0500  Gross per 24 hour  Intake     60 ml  Output    600 ml  Net   -540 ml   Filed Weights   01/04/13 0304  Weight: 91.445 kg (201 lb 9.6 oz)    Exam:   General:  Alert and oriented x3  Cardiovascular: Regular rate and rhythm  Respiratory: Faint wheezes bilaterally, fair  air movement  Abdomen: Soft nontender   Musculoskeletal: Intact   Data Reviewed: Basic Metabolic Panel:  Recent Labs Lab 01/03/13 1537 01/04/13 0625  NA 139 137  K 3.5 5.0  CL 105 107  CO2 24 20  GLUCOSE 101* 128*  BUN 10 7  CREATININE 0.88 0.61  CALCIUM 9.2 9.1   Liver Function Tests: No results found for this basename: AST, ALT, ALKPHOS, BILITOT, PROT, ALBUMIN,  in the last 168 hours No results found for this basename: LIPASE, AMYLASE,  in the last 168 hours No results found for this basename: AMMONIA,  in the last 168 hours CBC:  Recent Labs Lab 01/03/13 1537 01/04/13 0625  WBC 9.3 8.2  NEUTROABS 5.3  --   HGB 14.8 14.2  HCT 41.2 40.5  MCV 88.4 87.7  PLT 224 228   Cardiac Enzymes: No results found for this basename: CKTOTAL, CKMB, CKMBINDEX, TROPONINI,  in the last 168 hours BNP (last 3 results) No results found for this basename: PROBNP,  in the last 8760 hours CBG: No results found for this basename: GLUCAP,  in the last 168 hours  No results found for this or any previous visit (from the past 240 hour(s)).   Studies: Dg Chest 2 View  01/03/2013  *RADIOLOGY REPORT*  Clinical Data: Short of breath.  CHEST - 2 VIEW  Comparison: 12/14/2012  Findings: Small patchy infiltrate in the right middle lobe, suspicious for pneumonia.  No pleural effusion.  Negative for heart failure.  No  mass lesion.  IMPRESSION: Possible mild pneumonia in the right middle lobe.   Original Report Authenticated By: Janeece Riggers, M.D.     Scheduled Meds: . sodium chloride   Intravenous STAT  . albuterol  2.5 mg Nebulization Q6H  . azithromycin  500 mg Oral Daily  . cefTRIAXone (ROCEPHIN)  IV  1 g Intravenous Q24H  . dextromethorphan-guaiFENesin  1 tablet Oral Q12H  . enoxaparin (LOVENOX) injection  40 mg Subcutaneous Q24H  . ipratropium  0.5 mg Nebulization Q6H  . methylPREDNISolone (SOLU-MEDROL) injection  125 mg Intravenous Q6H   Continuous Infusions: . sodium chloride 125 mL/hr at 01/03/13 1605  . sodium chloride    . albuterol Stopped (01/03/13 2333)    Principal Problem:   CAP (community acquired pneumonia) Active Problems:   Asthma exacerbation   Tachycardia        Tuwanna Krausz  [facility] Pager [phone]. If 7PM-7AM, please contact night-coverage at [email or web address], password [facility] 01/04/2013, 11:27 AM  LOS: 1 day

## 2013-01-05 ENCOUNTER — Encounter (HOSPITAL_COMMUNITY): Payer: Self-pay | Admitting: *Deleted

## 2013-01-05 MED ORDER — ALBUTEROL SULFATE HFA 108 (90 BASE) MCG/ACT IN AERS: 2.0000 | INHALATION_SPRAY | Freq: Four times a day (QID) | RESPIRATORY_TRACT | Status: DC | PRN

## 2013-01-05 MED ORDER — LEVOFLOXACIN 500 MG PO TABS: 500.0000 mg | ORAL_TABLET | Freq: Every day | ORAL | Status: DC

## 2013-01-05 MED ORDER — PREDNISONE (PAK) 10 MG PO TABS: 10.0000 mg | ORAL_TABLET | ORAL | Status: DC

## 2013-01-05 NOTE — Progress Notes (Signed)
Went over all D/C info and instructions with pt. Pt says , " I am done smoking". 2 Daughters at bedside. Pt aware she needs PCP and called for appt to have all paperwork started for CP and Med assistance.

## 2013-01-05 NOTE — Progress Notes (Signed)
Pt discharged - preferred to walk to car with family. Pt has all personal belongings including laptop computer, all d/c instructions and prescriptions. Pt has a written note  from DR about missing her college classes from being admitted to the hospital.

## 2013-01-05 NOTE — Discharge Summary (Signed)
Physician Discharge Summary  Taylor Wilkinson ZOX:[id number] DOB: Aug 07, 1976 DOA: 01/03/2013  PCP: Patient was referred to the [facility]  Admit date: 01/03/2013 Discharge date: 01/05/2013  Time spent: 35 minutes   Discharge Diagnoses:  Principal Problem:   CAP (community acquired pneumonia) Active Problems:   Asthma exacerbation   TachycardiaSecondary to albuterol-resolved   Discharge Condition: Good  Diet recommendation: Regular  Filed Weights   01/04/13 0304 01/05/13 0552  Weight: 91.445 kg (201 lb 9.6 oz) 91.264 kg (201 lb 3.2 oz)    History of present illness:  Taylor Wilkinson is a 37 y.o. female Who presented to the ED with complaints of worsening SOB and wheezing over the past 3 days along with increased cough. She denies having fevers or chills, but reports having sweats. In the ED, she was evaluated and administered multiple neb treatements with minimal improvement. And a Chest X-ray reveal a RML Pneumonia. She was placed on antibiotic therapy to cover CAP and referred for admission.      Hospital Course:  1. Patient was admitted to the hospital, started on IV steroids and IV antibiotics. She was continued on bronchodilators as well. She received oxygen supplementation to help her with the dyspnea and hypoxia. She had a course of progressive recovery with resolution of hypoxia. Prior to discharge she was saturating 95% on room air. She was able to ambulate without exacerbating her symptoms.  She remained afebrile without signs of sepsis. Plan is to continue oral antibiotics, taper dose steroids, albuterol MDI. Patient to establish care for her chronic asthma.     Discharge Exam: Filed Vitals:   01/05/13 0552 01/05/13 0746 01/05/13 0900 01/05/13 1047  BP: 96/56   123/86  Pulse: 79   88  Temp: 98 F (36.7 C)   98.6 F (37 C)  TempSrc: Oral   Oral  Resp: 18   18  Height:      Weight: 91.264 kg (201 lb 3.2 oz)     SpO2: 99% 98% 99% 98%     General: Alert and oriented x3 Cardiovascular: Regular rate and rhythm Respiratory: Clear to auscultation bilaterally  Discharge Instructions     Medication List    STOP taking these medications       albuterol (5 MG/ML) 0.5% nebulizer solution  Commonly known as:  PROVENTIL      TAKE these medications       albuterol 108 (90 BASE) MCG/ACT inhaler  Commonly known as:  PROVENTIL HFA;VENTOLIN HFA  Inhale 2 puffs into the lungs every 6 (six) hours as needed. For shortness of breath     dextromethorphan-guaiFENesin 30-600 MG per 12 hr tablet  Commonly known as:  MUCINEX DM  Take 1 tablet by mouth every 12 (twelve) hours.     levofloxacin 500 MG tablet  Commonly known as:  LEVAQUIN  Take 1 tablet (500 mg total) by mouth daily.     predniSONE 10 MG tablet  Commonly known as:  STERAPRED UNI-PAK  Take 1 tablet (10 mg total) by mouth See admin instructions.          The results of significant diagnostics from this hospitalization (including imaging, microbiology, ancillary and laboratory) are listed below for reference.    Significant Diagnostic Studies: Dg Chest 2 View  01/03/2013  *RADIOLOGY REPORT*  Clinical Data: Short of breath.  CHEST - 2 VIEW  Comparison: 12/14/2012  Findings: Small patchy infiltrate in the right middle lobe, suspicious for pneumonia.  No pleural effusion.  Negative for heart failure.  No mass lesion.  IMPRESSION: Possible mild pneumonia in the right middle lobe.   Original Report Authenticated By: Janeece Riggers, M.D.    Dg Chest 2 View  12/14/2012  *RADIOLOGY REPORT*  Clinical Data:  Shortness of breath, cough and chest tightness.  CHEST - 2 VIEW  Comparison: 03/29/2008  Findings: The heart size and mediastinal contours are within normal limits.  Both lungs are clear.  The visualized skeletal structures are unremarkable.  IMPRESSION: No active disease.   Original Report Authenticated By: Irish Lack, M.D.     Microbiology: No results found for  this or any previous visit (from the past 240 hour(s)).   Labs: Basic Metabolic Panel:  Recent Labs Lab 01/03/13 1537 01/04/13 0625  NA 139 137  K 3.5 5.0  CL 105 107  CO2 24 20  GLUCOSE 101* 128*  BUN 10 7  CREATININE 0.88 0.61  CALCIUM 9.2 9.1   Liver Function Tests: No results found for this basename: AST, ALT, ALKPHOS, BILITOT, PROT, ALBUMIN,  in the last 168 hours No results found for this basename: LIPASE, AMYLASE,  in the last 168 hours No results found for this basename: AMMONIA,  in the last 168 hours CBC:  Recent Labs Lab 01/03/13 1537 01/04/13 0625  WBC 9.3 8.2  NEUTROABS 5.3  --   HGB 14.8 14.2  HCT 41.2 40.5  MCV 88.4 87.7  PLT 224 228   Cardiac Enzymes: No results found for this basename: CKTOTAL, CKMB, CKMBINDEX, TROPONINI,  in the last 168 hours BNP: BNP (last 3 results) No results found for this basename: PROBNP,  in the last 8760 hours CBG: No results found for this basename: GLUCAP,  in the last 168 hours     Signed:  Jesstin Studstill  [facility] 01/05/2013, 11:55 AM

## 2013-01-05 NOTE — Progress Notes (Signed)
In to speak with pt. About medication assistance and setting up a PCP.  Pt. Was eligible for the [facility] program.  Gave pt. MATCH letter and explained to pt. That she had to use the letter within 7days of dc, had to use one of the selected pharmacies from the letter, and that this assistance would only be used once a year.  Gave pt. Information about calling the Urgent Care Center [phone]) or the Family Medicine at [facility] [phone]) to obtain a PCP.  Pt. To be dc today with son.   Tera Mater, RN, BSN NCM [phone]

## 2013-01-26 ENCOUNTER — Other Ambulatory Visit (HOSPITAL_COMMUNITY): Payer: Self-pay | Admitting: Obstetrics

## 2013-01-26 DIAGNOSIS — Z1231 Encounter for screening mammogram for malignant neoplasm of breast: Secondary | ICD-10-CM

## 2013-02-06 ENCOUNTER — Ambulatory Visit (HOSPITAL_COMMUNITY)
Admission: RE | Admit: 2013-02-06 | Discharge: 2013-02-06 | Disposition: A | Discharge status: Home / Self Care | Payer: Medicaid Other | Source: Ambulatory Visit | Attending: Obstetrics | Admitting: Obstetrics

## 2013-02-06 DIAGNOSIS — Z1231 Encounter for screening mammogram for malignant neoplasm of breast: Secondary | ICD-10-CM

## 2013-02-09 ENCOUNTER — Other Ambulatory Visit: Payer: Self-pay | Admitting: Obstetrics

## 2013-02-09 DIAGNOSIS — R928 Other abnormal and inconclusive findings on diagnostic imaging of breast: Secondary | ICD-10-CM

## 2013-02-19 ENCOUNTER — Ambulatory Visit
Admission: RE | Admit: 2013-02-19 | Discharge: 2013-02-19 | Disposition: A | Discharge status: Home / Self Care | Payer: Medicaid Other | Source: Ambulatory Visit | Attending: Obstetrics | Admitting: Obstetrics

## 2013-02-19 DIAGNOSIS — R928 Other abnormal and inconclusive findings on diagnostic imaging of breast: Secondary | ICD-10-CM

## 2013-03-02 ENCOUNTER — Encounter: Payer: Self-pay | Admitting: Pulmonary Disease

## 2013-03-02 ENCOUNTER — Other Ambulatory Visit: Payer: Medicaid Other

## 2013-03-02 ENCOUNTER — Ambulatory Visit (INDEPENDENT_AMBULATORY_CARE_PROVIDER_SITE_OTHER): Payer: Medicaid Other | Admitting: Pulmonary Disease

## 2013-03-02 VITALS — BP 118/62 | HR 72 | Temp 98.3°F | Ht 66.0 in | Wt 204.4 lb

## 2013-03-02 DIAGNOSIS — J4541 Moderate persistent asthma with (acute) exacerbation: Secondary | ICD-10-CM

## 2013-03-02 DIAGNOSIS — J189 Pneumonia, unspecified organism: Secondary | ICD-10-CM

## 2013-03-02 DIAGNOSIS — J45901 Unspecified asthma with (acute) exacerbation: Secondary | ICD-10-CM

## 2013-03-02 DIAGNOSIS — J45909 Unspecified asthma, uncomplicated: Secondary | ICD-10-CM

## 2013-03-02 MED ORDER — FLUTICASONE PROPIONATE HFA 110 MCG/ACT IN AERO: 1.0000 | INHALATION_SPRAY | Freq: Two times a day (BID) | RESPIRATORY_TRACT | Status: DC

## 2013-03-02 NOTE — Progress Notes (Signed)
Subjective:    Patient ID: Taylor Wilkinson, female    DOB: 1976/08/08, 37 y.o.   MRN: [id number]  HPI Gyn- Gaynell Face 37 year old smoker referred for evaluation of shortness of breath and wheezing Pt has been to the ED 3 times in the past 6 months. She stated all of sudden she will get a period of SOB, cough, wheezing--when this happens her O2 would be low in the 60's. this has happened twice. per pt this did not feel like an asthma attack.  She was admitted to the hospital 4/26-28/14 for sob/ wheezing, RML infiltrate (not impressive over CXR) , treated with steroids, IV levaquin & bronchodilators . She received oxygen supplementation. She had a course of progressive recovery with resolution of hypoxia. Prior to discharge she was saturating 95% on room air. She has never been formally diagnosed with asthma but has been using Primatene Mist since 2006 off and on. Her symptoms seemed to worsen over the last 2 years. She moved from [place] to [facility] [place]. Her triggers seem to be weather changes and she wonders if mold in her cousins home could be a precipitant. She now has her own apartment for the last 2 years. She has 4 kids and her third child age 57 has asthma and is also does one brother. She uses her son's albuterol nebs on occasion. Spirometry today showed normal lung function without any evidence of airway obstruction. She smokes about 6 cigarettes a day,a pack lasts her week. She also smokes 1-2 marijuana blunts daily   Past Medical History  Diagnosis Date  . Asthma   . Bronchitis     No past surgical history on file.  No Known Allergies  History   Social History  . Marital Status: Single    Spouse Name: N/A    Number of Children: 4  . Years of Education: N/A   Occupational History  . student    Social History Main Topics  . Smoking status: Current Some Day Smoker -- 0.20 packs/day for 10 years    Types: Cigarettes  . Smokeless tobacco: Not on file     Comment: 3-6  cigs a day/03/02/13  . Alcohol Use: Yes     Comment: social  . Drug Use: Yes    Special: Marijuana     Comment: marijuana--blunt a day--maybe 2 a day  . Sexually Active: Not Currently   Other Topics Concern  . Not on file   Social History Narrative  . No narrative on file    Family History  Problem Relation Age of Onset  . Hypertension Mother   . Diabetes Mother   . Asthma Brother      Review of Systems  Constitutional: Negative for appetite change and unexpected weight change.  HENT: Negative for ear pain, congestion, sore throat, sneezing, trouble swallowing and dental problem.   Respiratory: Negative for cough and shortness of breath.   Cardiovascular: Negative for palpitations and leg swelling.  Gastrointestinal: Negative for abdominal pain.  Skin: Negative for rash.  Neurological: Negative for headaches.  Psychiatric/Behavioral: Negative for dysphoric mood. The patient is not nervous/anxious.        Objective:   Physical Exam  Gen. Pleasant, well-nourished, in no distress, normal affect ENT - no lesions, no post nasal drip Neck: No JVD, no thyromegaly, no carotid bruits Lungs: no use of accessory muscles, no dullness to percussion, clear without rales or rhonchi  Cardiovascular: Rhythm regular, heart sounds  normal, no murmurs or gallops, no peripheral  edema Abdomen: soft and non-tender, no hepatosplenomegaly, BS normal. Musculoskeletal: No deformities, no cyanosis or clubbing Neuro:  alert, non focal        Assessment & Plan:

## 2013-03-02 NOTE — Assessment & Plan Note (Signed)
You may have asthma- trigger is unclear Lung function appears good today Start taking flovent 110 1 puff twice daily - RINSE mouth after use -MAINTENANCE medication Use albuterol as needed only Blood work for allergies

## 2013-03-02 NOTE — Assessment & Plan Note (Signed)
Chest x-ray is not convincing  -Resolved

## 2013-03-02 NOTE — Patient Instructions (Addendum)
You may have asthma- trigger is unclear Lung function appears good today Start taking flovent 110 MCG 1 puff twice daily - RINSE mouth after use -MAINTENANCE medication Use albuterol as needed only Blood work for allergies

## 2013-03-26 ENCOUNTER — Ambulatory Visit: Payer: Medicaid Other | Admitting: Adult Health

## 2013-03-30 ENCOUNTER — Ambulatory Visit: Payer: Medicaid Other | Admitting: Adult Health

## 2013-04-02 ENCOUNTER — Ambulatory Visit: Payer: Medicaid Other | Admitting: Adult Health

## 2013-04-08 ENCOUNTER — Ambulatory Visit: Payer: Medicaid Other | Admitting: Adult Health

## 2013-04-13 ENCOUNTER — Encounter: Payer: Self-pay | Admitting: Adult Health

## 2013-12-19 ENCOUNTER — Encounter (HOSPITAL_COMMUNITY): Payer: Self-pay | Admitting: Emergency Medicine

## 2013-12-19 ENCOUNTER — Emergency Department (HOSPITAL_COMMUNITY)
Admission: EM | Admit: 2013-12-19 | Discharge: 2013-12-19 | Disposition: A | Discharge status: Home / Self Care | Payer: Medicaid Other | Source: Home / Self Care | Attending: Family Medicine | Admitting: Family Medicine

## 2013-12-19 DIAGNOSIS — L738 Other specified follicular disorders: Secondary | ICD-10-CM

## 2013-12-19 DIAGNOSIS — L739 Follicular disorder, unspecified: Secondary | ICD-10-CM

## 2013-12-19 MED ORDER — DOXYCYCLINE HYCLATE 100 MG PO CAPS: 100.0000 mg | ORAL_CAPSULE | Freq: Two times a day (BID) | ORAL | Status: DC

## 2013-12-19 NOTE — Discharge Instructions (Signed)
Thank you for coming in today. Take doxycycline twice daily for 10 days Return if worsening  Folliculitis  Folliculitis is redness, soreness, and swelling (inflammation) of the hair follicles. This condition can occur anywhere on the body. People with weakened immune systems, diabetes, or obesity have a greater risk of getting folliculitis. CAUSES  Bacterial infection. This is the most common cause.  Fungal infection.  Viral infection.  Contact with certain chemicals, especially oils and tars. Long-term folliculitis can result from bacteria that live in the nostrils. The bacteria may trigger multiple outbreaks of folliculitis over time. SYMPTOMS Folliculitis most commonly occurs on the scalp, thighs, legs, back, buttocks, and areas where hair is shaved frequently. An early sign of folliculitis is a small, white or yellow, pus-filled, itchy lesion (pustule). These lesions appear on a red, inflamed follicle. They are usually less than 0.2 inches (5 mm) wide. When there is an infection of the follicle that goes deeper, it becomes a boil or furuncle. A group of closely packed boils creates a larger lesion (carbuncle). Carbuncles tend to occur in hairy, sweaty areas of the body. DIAGNOSIS  Your caregiver can usually tell what is wrong by doing a physical exam. A sample may be taken from one of the lesions and tested in a lab. This can help determine what is causing your folliculitis. TREATMENT  Treatment may include:  Applying warm compresses to the affected areas.  Taking antibiotic medicines orally or applying them to the skin.  Draining the lesions if they contain a large amount of pus or fluid.  Laser hair removal for cases of long-lasting folliculitis. This helps to prevent regrowth of the hair. HOME CARE INSTRUCTIONS  Apply warm compresses to the affected areas as directed by your caregiver.  If antibiotics are prescribed, take them as directed. Finish them even if you start to  feel better.  You may take over-the-counter medicines to relieve itching.  Do not shave irritated skin.  Follow up with your caregiver as directed. SEEK IMMEDIATE MEDICAL CARE IF:   You have increasing redness, swelling, or pain in the affected area.  You have a fever. MAKE SURE YOU:  Understand these instructions.  Will watch your condition.  Will get help right away if you are not doing well or get worse. Document Released: 11/05/2001 Document Revised: 02/26/2012 Document Reviewed: 11/27/2011 [facility] Patient Information 2014 [place], [place].

## 2013-12-19 NOTE — ED Notes (Signed)
Pt  3  Days  Ago  Had  A  Small  Bump  r  Side of  Her  Face   She squeezed it  And  Made it  Worse  Now  It  Is  Swollen  Tender and  Red

## 2013-12-19 NOTE — ED Provider Notes (Signed)
Taylor Wilkinson is a 38 y.o. female who presents to Urgent Care today for folliculitis. Patient noted a pimple on her right face starting about 4 days ago. She expressed some pus out of the skin then became inflamed and tender. The inflamed skin has persisted and somewhat worsened. The pain is mild to moderate. She denies any fevers or chills nausea vomiting or diarrhea. She has not tried any medications. She feels well otherwise.   Past Medical History  Diagnosis Date  . Asthma   . Bronchitis    History  Substance Use Topics  . Smoking status: Current Some Day Smoker -- 0.20 packs/day for 10 years    Types: Cigarettes  . Smokeless tobacco: Not on file     Comment: 3-6 cigs a day/03/02/13  . Alcohol Use: Yes     Comment: social   ROS as above Medications: No current facility-administered medications for this encounter.   Current Outpatient Prescriptions  Medication Sig Dispense Refill  . albuterol (PROVENTIL HFA;VENTOLIN HFA) 108 (90 BASE) MCG/ACT inhaler Inhale 2 puffs into the lungs every 6 (six) hours as needed. For shortness of breath  1 Inhaler  0  . doxycycline (VIBRAMYCIN) 100 MG capsule Take 1 capsule (100 mg total) by mouth 2 (two) times daily.  20 capsule  0  . fluticasone (FLOVENT HFA) 110 MCG/ACT inhaler Inhale 1 puff into the lungs 2 (two) times daily.  1 Inhaler  0    Exam:  BP 107/68  Pulse 82  Temp(Src) 98.4 F (36.9 C) (Oral)  Resp 17  SpO2 100%  LMP 11/22/2013 Gen: Well NAD HEENT: EOMI,  MMM Lungs: Normal work of breathing. CTABL Heart: RRR no MRG Skin: Erythematous and indurated skin lateral to the right orbit by approximately 2 cm. No fluctuance palpated. Irritation extends through diameter of about a dime. Mildly tender to touch.    No results found for this or any previous visit (from the past 24 hour(s)). No results found.  Assessment and Plan: 38 y.o. female with folliculitis or cellulitis of the right face. Plan to treat with doxycycline.  Followup if not improving.  Discussed warning signs or symptoms. Please see discharge instructions. Patient expresses understanding.    Gregor Hams, MD 12/19/13 [phone]

## 2014-01-01 ENCOUNTER — Inpatient Hospital Stay (HOSPITAL_COMMUNITY): Payer: Medicaid Other

## 2014-01-01 ENCOUNTER — Inpatient Hospital Stay (HOSPITAL_COMMUNITY)
Admission: AD | Admit: 2014-01-01 | Discharge: 2014-01-01 | Disposition: A | Discharge status: Home / Self Care | Payer: Medicaid Other | Source: Ambulatory Visit | Attending: Obstetrics | Admitting: Obstetrics

## 2014-01-01 ENCOUNTER — Encounter (HOSPITAL_COMMUNITY): Payer: Self-pay | Admitting: *Deleted

## 2014-01-01 DIAGNOSIS — O4690 Antepartum hemorrhage, unspecified, unspecified trimester: Secondary | ICD-10-CM

## 2014-01-01 DIAGNOSIS — O200 Threatened abortion: Secondary | ICD-10-CM | POA: Insufficient documentation

## 2014-01-01 DIAGNOSIS — O99330 Smoking (tobacco) complicating pregnancy, unspecified trimester: Secondary | ICD-10-CM | POA: Insufficient documentation

## 2014-01-01 LAB — CBC
HCT: 40.6 % (ref 36.0–46.0)
Hemoglobin: 14.1 g/dL (ref 12.0–15.0)
MCH: 32.4 pg (ref 26.0–34.0)
MCHC: 34.7 g/dL (ref 30.0–36.0)
MCV: 93.3 fL (ref 78.0–100.0)
Platelets: 232 10*3/uL (ref 150–400)
RBC: 4.35 MIL/uL (ref 3.87–5.11)
RDW: 14.5 % (ref 11.5–15.5)
WBC: 8.7 10*3/uL (ref 4.0–10.5)

## 2014-01-01 LAB — URINALYSIS, ROUTINE W REFLEX MICROSCOPIC
Bilirubin Urine: NEGATIVE
GLUCOSE, UA: NEGATIVE mg/dL
Ketones, ur: 15 mg/dL — AB
LEUKOCYTES UA: NEGATIVE
Nitrite: NEGATIVE
PROTEIN: NEGATIVE mg/dL
Specific Gravity, Urine: 1.02 (ref 1.005–1.030)
UROBILINOGEN UA: 0.2 mg/dL (ref 0.0–1.0)
pH: 6 (ref 5.0–8.0)

## 2014-01-01 LAB — ABO/RH: ABO/RH(D): O POS

## 2014-01-01 LAB — URINE MICROSCOPIC-ADD ON

## 2014-01-01 LAB — WET PREP, GENITAL
Trich, Wet Prep: NONE SEEN
WBC, Wet Prep HPF POC: NONE SEEN
Yeast Wet Prep HPF POC: NONE SEEN

## 2014-01-01 LAB — POCT PREGNANCY, URINE: PREG TEST UR: POSITIVE — AB

## 2014-01-01 LAB — HCG, QUANTITATIVE, PREGNANCY: hCG, Beta Chain, Quant, S: 2181 m[IU]/mL — ABNORMAL HIGH (ref <5)

## 2014-01-01 MED ORDER — ACETAMINOPHEN 500 MG PO TABS: 1000.0000 mg | ORAL_TABLET | Freq: Once | ORAL | Status: DC

## 2014-01-01 NOTE — MAU Note (Signed)
Had some bleeding and passed a clot.. No cramping.

## 2014-01-01 NOTE — Discharge Instructions (Signed)
Pelvic Rest °Pelvic rest is sometimes recommended for women when:  °· The placenta is partially or completely covering the opening of the cervix (placenta previa). °· There is bleeding between the uterine wall and the amniotic sac in the first trimester (subchorionic hemorrhage). °· The cervix begins to open without labor starting (incompetent cervix, cervical insufficiency). °· The labor is too early (preterm labor). °HOME CARE INSTRUCTIONS °· Do not have sexual intercourse, stimulation, or an orgasm. °· Do not use tampons, douche, or put anything in the vagina. °· Do not lift anything over 10 pounds (4.5 kg). °· Avoid strenuous activity or straining your pelvic muscles. °SEEK MEDICAL CARE IF:  °· You have any vaginal bleeding during pregnancy. Treat this as a potential emergency. °· You have cramping pain felt low in the stomach (stronger than menstrual cramps). °· You notice vaginal discharge (watery, mucus, or bloody). °· You have a low, dull backache. °· There are regular contractions or uterine tightening. °SEEK IMMEDIATE MEDICAL CARE IF: °You have vaginal bleeding and have placenta previa.  °Document Released: 12/22/2010 Document Revised: 11/19/2011 Document Reviewed: 12/22/2010 °ExitCare® Patient Information ©2014 [employer]. ° °Threatened Miscarriage ° A threatened miscarriage is a pregnancy that may end. It may be marked by bleeding during the first 20 weeks of pregnancy. Often, the pregnancy can continue without any more problems. You may be asked to stop: °· Having sex (intercourse). °· Having orgasms. °· Using tampons. °· Exercising. °· Doing heavy physical activity and work. °HOME CARE  °· Your doctor may tell you to take bed rest and to stop activities and work. °· Write down the number of pads you use each day. Write down how often you change pads. Write down how soaked they are. °· Follow your doctor's advice for follow-up visits and tests. °· If your blood type is Rh-negative and the father's  blood is Rh-positive (or is not known), you may get a shot to protect the baby. °· If you have a miscarriage, save all the tissue you pass in a container. Take the container to your doctor. °GET HELP RIGHT AWAY IF:  °· You have bad cramps or pain in your belly (abdomen), lower belly, or back. °· You have a fever or chills. °· Your bleeding gets worse or you pass large clots of blood or tissue. Save this tissue to show your doctor. °· You feel lightheaded, weak, dizzy, or pass out (faint). °· You have a gush of fluid from your vagina. °MAKE SURE YOU:  °· Understand these instructions. °· Will watch your condition. °· Will get help right away if you are not doing well or get worse. °Document Released: 08/09/2008 Document Revised: 11/19/2011 Document Reviewed: 09/12/2009 °ExitCare® Patient Information ©2014 [employer]. ° °

## 2014-01-01 NOTE — MAU Note (Signed)
Intermittent bright red bleeding since 1400 today; pt was having a tatoo on her back when she felt the bleeding the first time;

## 2014-01-01 NOTE — MAU Provider Note (Signed)
History     CSN: [id number]  Arrival date and time: 01/01/14 1437   First Provider Initiated Contact with Patient 01/01/14 1607      Chief Complaint  Patient presents with  . Vaginal Bleeding   HPI  Taylor Wilkinson is a 38 y.o. female [phone] at [redacted]w[redacted]d who presents with vaginal bleeding. Pt had a confirmation of pregnancy with Dr. Ruthann Cancer yesterday. The patient was getting a tattoo done and around 2:00 this afternoon she felt like she was peeing on herself. When she went to the bathroom she saw bright red blood in the toilet with clots. She assessed the blood clot to try and determine if it was a clot vs. A gestational sac. Patient has very minimal bleeding since arrival to MAU; she denies pain now.    OB History   Grav Para Term Preterm Abortions TAB SAB Ect Mult Living   6 4 4  1  1   4       Past Medical History  Diagnosis Date  . Asthma   . Bronchitis     Past Surgical History  Procedure Laterality Date  . No past surgeries      Family History  Problem Relation Age of Onset  . Hypertension Mother   . Diabetes Mother   . Asthma Brother     History  Substance Use Topics  . Smoking status: Current Some Day Smoker -- 0.20 packs/day for 10 years    Types: Cigarettes  . Smokeless tobacco: Not on file     Comment: 3-6 cigs a day/03/02/13  . Alcohol Use: Yes     Comment: social    Allergies: No Known Allergies  Prescriptions prior to admission  Medication Sig Dispense Refill  . doxycycline (VIBRAMYCIN) 100 MG capsule Take 1 capsule (100 mg total) by mouth 2 (two) times daily.  20 capsule  0  . albuterol (PROVENTIL HFA;VENTOLIN HFA) 108 (90 BASE) MCG/ACT inhaler Inhale 2 puffs into the lungs every 6 (six) hours as needed. For shortness of breath  1 Inhaler  0  . fluticasone (FLOVENT HFA) 110 MCG/ACT inhaler Inhale 1 puff into the lungs 2 (two) times daily.  1 Inhaler  0   Results for orders placed during the hospital encounter of 01/01/14 (from the past 48  hour(s))  URINALYSIS, ROUTINE W REFLEX MICROSCOPIC     Status: Abnormal   Collection Time    01/01/14  3:50 PM      Result Value Ref Range   Color, Urine YELLOW  YELLOW   APPearance CLEAR  CLEAR   Specific Gravity, Urine 1.020  1.005 - 1.030   pH 6.0  5.0 - 8.0   Glucose, UA NEGATIVE  NEGATIVE mg/dL   Hgb urine dipstick LARGE (*) NEGATIVE   Bilirubin Urine NEGATIVE  NEGATIVE   Ketones, ur 15 (*) NEGATIVE mg/dL   Protein, ur NEGATIVE  NEGATIVE mg/dL   Urobilinogen, UA 0.2  0.0 - 1.0 mg/dL   Nitrite NEGATIVE  NEGATIVE   Leukocytes, UA NEGATIVE  NEGATIVE  URINE MICROSCOPIC-ADD ON     Status: Abnormal   Collection Time    01/01/14  3:50 PM      Result Value Ref Range   Squamous Epithelial / LPF FEW (*) RARE   RBC / HPF 3-6  <3 RBC/hpf  CBC     Status: None   Collection Time    01/01/14  3:51 PM      Result Value Ref Range   WBC  8.7  4.0 - 10.5 K/uL   RBC 4.35  3.87 - 5.11 MIL/uL   Hemoglobin 14.1  12.0 - 15.0 g/dL   HCT 40.6  36.0 - 46.0 %   MCV 93.3  78.0 - 100.0 fL   MCH 32.4  26.0 - 34.0 pg   MCHC 34.7  30.0 - 36.0 g/dL   RDW 14.5  11.5 - 15.5 %   Platelets 232  150 - 400 K/uL  ABO/RH     Status: None   Collection Time    01/01/14  3:51 PM      Result Value Ref Range   ABO/RH(D) O POS    HCG, QUANTITATIVE, PREGNANCY     Status: Abnormal   Collection Time    01/01/14  3:51 PM      Result Value Ref Range   hCG, Beta Chain, Quant, S 2181 (*) <5 mIU/mL   Comment:              GEST. AGE      CONC.  (mIU/mL)       <=1 WEEK        5 - 50         2 WEEKS       50 - 500         3 WEEKS       100 - 10,000         4 WEEKS     1,000 - 30,000         5 WEEKS     3,500 - 115,000       6-8 WEEKS     12,000 - 270,000        12 WEEKS     15,000 - 220,000                FEMALE AND NON-PREGNANT FEMALE:         LESS THAN 5 mIU/mL  POCT PREGNANCY, URINE     Status: Abnormal   Collection Time    01/01/14  3:55 PM      Result Value Ref Range   Preg Test, Ur POSITIVE (*) NEGATIVE    Comment:            THE SENSITIVITY OF THIS     METHODOLOGY IS >24 mIU/mL  WET PREP, GENITAL     Status: Abnormal   Collection Time    01/01/14  4:45 PM      Result Value Ref Range   Yeast Wet Prep HPF POC NONE SEEN  NONE SEEN   Trich, Wet Prep NONE SEEN  NONE SEEN   Clue Cells Wet Prep HPF POC FEW (*) NONE SEEN   WBC, Wet Prep HPF POC NONE SEEN  NONE SEEN   Comment: FEW BACTERIA SEEN   US Ob Comp Less 14 Wks  01/01/2014   CLINICAL DATA:  Early pregnancy, vaginal bleeding, quantitative beta HCG = 2,181  EXAM: OBSTETRIC <14 WK [place] AND TRANSVAGINAL OB US  TECHNIQUE: Both transabdominal and transvaginal ultrasound examinations were performed for complete evaluation of the gestation as well as the maternal uterus, adnexal regions, and pelvic cul-de-sac. Transvaginal technique was performed to assess early pregnancy.  COMPARISON:  None for this gestation  FINDINGS: Intrauterine gestational sac: Not identified  Yolk sac:  N/A  Embryo:  N/A  Cardiac Activity: N/A  Heart Rate:  N/A bpm  MSD:  N/A  Maternal uterus/adnexae:  Uterus appears normal in size.  Three hypoechoic nodules identified question  small leiomyomata measuring 16 mm in greatest size.  No endometrial fluid collection or potential gestational sac identified.  Endometrial complex appears heterogeneous measuring up to 13 mm thick.  Left ovary normal size and morphology, 2.3 x 2.6 x 1.9 cm.  Right ovary normal size and morphology, 1.8 x 2.6 x 2.0 cm.  No adnexal masses or free pelvic fluid.  IMPRESSION: No intrauterine gestational identified.  Three probable small intramural uterine leiomyomata largest measuring 16 mm greatest dimension.  Unremarkable ovaries.  Within early intrauterine gestation, a gestational sac should be visible at the measured quantitative beta HCG level.  Findings could represent spontaneous abortion or ectopic pregnancy, most likely early intrauterine gestation too early to visualize, though no supporting signs of ectopic  pregnancy are identified.  Serial quantitative beta HCG and or follow-up ultrasound recommended to definitively exclude ectopic pregnancy.   Electronically Signed   By: Lavonia Dana M.D.   On: 01/01/2014 18:28   US Ob Transvaginal  01/01/2014   CLINICAL DATA:  Early pregnancy, vaginal bleeding, quantitative beta HCG = 2,181  EXAM: OBSTETRIC <14 WK [place] AND TRANSVAGINAL OB US  TECHNIQUE: Both transabdominal and transvaginal ultrasound examinations were performed for complete evaluation of the gestation as well as the maternal uterus, adnexal regions, and pelvic cul-de-sac. Transvaginal technique was performed to assess early pregnancy.  COMPARISON:  None for this gestation  FINDINGS: Intrauterine gestational sac: Not identified  Yolk sac:  N/A  Embryo:  N/A  Cardiac Activity: N/A  Heart Rate:  N/A bpm  MSD:  N/A  Maternal uterus/adnexae:  Uterus appears normal in size.  Three hypoechoic nodules identified question small leiomyomata measuring 16 mm in greatest size.  No endometrial fluid collection or potential gestational sac identified.  Endometrial complex appears heterogeneous measuring up to 13 mm thick.  Left ovary normal size and morphology, 2.3 x 2.6 x 1.9 cm.  Right ovary normal size and morphology, 1.8 x 2.6 x 2.0 cm.  No adnexal masses or free pelvic fluid.  IMPRESSION: No intrauterine gestational identified.  Three probable small intramural uterine leiomyomata largest measuring 16 mm greatest dimension.  Unremarkable ovaries.  Within early intrauterine gestation, a gestational sac should be visible at the measured quantitative beta HCG level.  Findings could represent spontaneous abortion or ectopic pregnancy, most likely early intrauterine gestation too early to visualize, though no supporting signs of ectopic pregnancy are identified.  Serial quantitative beta HCG and or follow-up ultrasound recommended to definitively exclude ectopic pregnancy.   Electronically Signed   By: Lavonia Dana M.D.   On:  01/01/2014 18:28    Review of Systems  Constitutional: Negative for fever and chills.  Gastrointestinal: Negative for nausea, vomiting, abdominal pain, diarrhea and constipation.  Genitourinary: Negative for dysuria, urgency, frequency, hematuria and flank pain.       No abnormal vaginal bleeding + vaginal bleeding; small amount.  No dysuria.    Physical Exam   Blood pressure 131/85, pulse 86, temperature 99.4 F (37.4 C), temperature source Oral, resp. rate 18, height 5\' 4"  (1.626 m), weight 85.73 kg (189 lb), last menstrual period 11/22/2013, unknown if currently breastfeeding.  Physical Exam  Constitutional: She is oriented to person, place, and time. She appears well-developed and well-nourished. No distress.  HENT:  Head: Normocephalic.  Eyes: Pupils are equal, round, and reactive to light.  Neck: Neck supple.  Respiratory: Effort normal.  GI: Soft. She exhibits no distension. There is no tenderness. There is no rebound and no guarding.  Genitourinary:  Speculum exam: Vagina - Moderate amount of dark red blood in vaginal canal.  Cervix - + active bleeding  Bimanual exam: Cervix closed, No CMT  Uterus non tender, enlarged  Adnexa non tender, no masses bilaterally GC/Chlam, wet prep done Chaperone present for exam.   Musculoskeletal: Normal range of motion.  Neurological: She is alert and oriented to person, place, and time.  Skin: Skin is warm. She is not diaphoretic.  Psychiatric: Her behavior is normal.    MAU Course  Procedures None  MDM O positive blood type  UA Upt ABO CBC [place]   Assessment and Plan   A:  Threatened miscarriage Vaginal bleeding in pregnancy; cannot exclude ectopic pregnancy   P:  Discharge home in stable condition Return to MAU in 48 hours for repeat beta hcg level Return to MAU with worsening bleeding or pain Ectopic precautions Pelvic rest Support given    Darrelyn Hillock Rasch, NP  01/01/2014, 7:08 PM

## 2014-01-02 LAB — GC/CHLAMYDIA PROBE AMP
CT PROBE, AMP APTIMA: NEGATIVE
GC Probe RNA: NEGATIVE

## 2014-01-05 ENCOUNTER — Emergency Department (HOSPITAL_COMMUNITY)
Admission: EM | Admit: 2014-01-05 | Discharge: 2014-01-05 | Disposition: A | Discharge status: Home / Self Care | Payer: Medicaid Other | Attending: Emergency Medicine | Admitting: Emergency Medicine

## 2014-01-05 ENCOUNTER — Encounter (HOSPITAL_COMMUNITY): Payer: Self-pay | Admitting: Emergency Medicine

## 2014-01-05 DIAGNOSIS — IMO0002 Reserved for concepts with insufficient information to code with codable children: Secondary | ICD-10-CM | POA: Insufficient documentation

## 2014-01-05 DIAGNOSIS — L03119 Cellulitis of unspecified part of limb: Principal | ICD-10-CM

## 2014-01-05 DIAGNOSIS — L02419 Cutaneous abscess of limb, unspecified: Secondary | ICD-10-CM | POA: Insufficient documentation

## 2014-01-05 DIAGNOSIS — J45909 Unspecified asthma, uncomplicated: Secondary | ICD-10-CM | POA: Insufficient documentation

## 2014-01-05 DIAGNOSIS — Z79899 Other long term (current) drug therapy: Secondary | ICD-10-CM | POA: Insufficient documentation

## 2014-01-05 DIAGNOSIS — Z87891 Personal history of nicotine dependence: Secondary | ICD-10-CM | POA: Insufficient documentation

## 2014-01-05 DIAGNOSIS — L0291 Cutaneous abscess, unspecified: Secondary | ICD-10-CM

## 2014-01-05 HISTORY — DX: Complete or unspecified spontaneous abortion without complication: O03.9

## 2014-01-05 LAB — CBG MONITORING, ED: GLUCOSE-CAPILLARY: 75 mg/dL (ref 70–99)

## 2014-01-05 NOTE — ED Provider Notes (Signed)
CSN: [id number]     Arrival date & time 01/05/14  2426 History   First MD Initiated Contact with Patient 01/05/14 [phone]     Chief Complaint  Patient presents with  . Abscess     (Consider location/radiation/quality/duration/timing/severity/associated sxs/prior Treatment) Patient is a 38 y.o. female presenting with abscess. The history is provided by the patient. No language interpreter was used.  Abscess Location:  Leg Leg abscess location: R  proximal thigh. Size:  3 cm Abscess quality: fluctuance, induration, painful, redness and warmth   Red streaking: no   Duration:  3 days Progression:  Worsening Pain details:    Quality:  Burning, hot and aching   Severity:  Moderate   Timing:  Constant   Progression:  Worsening Chronicity:  New Context: not diabetes, not immunosuppression, not injected drug use, not insect bite/sting and not skin injury   Relieved by:  None tried Associated symptoms: no anorexia, no fever, no nausea and no vomiting   Risk factors: no prior abscess      Past Medical History  Diagnosis Date  . Asthma   . Bronchitis   . Miscarriage    Past Surgical History  Procedure Laterality Date  . No past surgeries     Family History  Problem Relation Age of Onset  . Hypertension Mother   . Diabetes Mother   . Asthma Brother    History  Substance Use Topics  . Smoking status: Former Smoker -- 0.20 packs/day for 10 years    Quit date: 02/04/2013  . Smokeless tobacco: Not on file     Comment: 3-6 cigs a day/03/02/13  . Alcohol Use: Yes     Comment: social   OB History   Grav Para Term Preterm Abortions TAB SAB Ect Mult Living   6 4 4  1  1   4      Review of Systems  Constitutional: Negative for fever and chills.  Gastrointestinal: Negative for nausea, vomiting and anorexia.  Skin: Positive for wound.  All other systems reviewed and are negative.     Allergies  Review of patient's allergies indicates no known allergies.  Home Medications    Prior to Admission medications   Medication Sig Start Date End Date Taking? Authorizing Provider  albuterol (PROVENTIL HFA;VENTOLIN HFA) 108 (90 BASE) MCG/ACT inhaler Inhale 2 puffs into the lungs every 6 (six) hours as needed. For shortness of breath 01/05/13  Yes Sorin June Leap, MD  fluticasone (FLOVENT HFA) 110 MCG/ACT inhaler Inhale 1 puff into the lungs 2 (two) times daily. 03/02/13  Yes Rigoberto Noel, MD   BP 113/75  Pulse 75  Temp(Src) 97.5 F (36.4 C) (Oral)  Resp 16  Ht 5\' 6"  (1.676 m)  Wt 179 lb (81.194 kg)  BMI 28.91 kg/m2  SpO2 100%  LMP 11/22/2013  Breastfeeding? Unknown Physical Exam Physical Exam  Nursing note and vitals reviewed. Constitutional: She is oriented to person, place, and time. She appears well-developed and well-nourished. No distress.  HENT:  Head: Normocephalic and atraumatic.  Eyes: Conjunctivae normal and EOM are normal. Pupils are equal, round, and reactive to light. No scleral icterus.  Neck: Normal range of motion.  Cardiovascular: Normal rate, regular rhythm and normal heart sounds.  Exam reveals no gallop and no friction rub.   No murmur heard. Pulmonary/Chest: Effort normal and breath sounds normal. No respiratory distress.  Abdominal: Soft. Bowel sounds are normal. She exhibits no distension and no mass. There is no tenderness. There is no  guarding.  Neurological: She is alert and oriented to person, place, and time.  Skin: Skin is warm and dry. She is not diaphoretic.   4 cm abscess R medial thigh with 3 cm surrounding erythema. No induration or streaking   ED Course  Procedures (including critical care time) Labs Review Labs Reviewed  CBG MONITORING, ED    Imaging Review No results found.   EKG Interpretation None       INCISION AND DRAINAGE Performed by: Margarita Mail Consent: Verbal consent obtained. Risks and benefits: risks, benefits and alternatives were discussed Type: abscess  Body area: thigh R  Anesthesia:  local infiltration  Incision was made with a scalpel.  Local anesthetic: lidocaine 2 % w/o epinephrine  Anesthetic total: 5 ml  Complexity: complex Blunt dissection to break up loculations  Drainage: purulent  Drainage amount: moderate  Packing material: none  Patient tolerance: Patient tolerated the procedure well with no immediate complications.   MDM   Final diagnoses:  Abscess    Abscess opened and drained. Flushed thoroughly. Too small for packing. Discussed reasons for wound check. Erythema demarcated. NO ABX. The patient appears reasonably screened and/or stabilized for discharge and I doubt any other medical condition or other [facility] requiring further screening, evaluation, or treatment in the ED at this time prior to discharge.     Margarita Mail, PA-C 01/11/14 1156

## 2014-01-05 NOTE — Discharge Instructions (Signed)
 Abscess Care After An abscess (also called a boil or furuncle) is an infected area that contains a collection of pus. Signs and symptoms of an abscess include pain, tenderness, redness, or hardness, or you may feel a moveable soft area under your skin. An abscess can occur anywhere in the body. The infection may spread to surrounding tissues causing cellulitis. A cut (incision) by the surgeon was made over your abscess and the pus was drained out. Gauze may have been packed into the space to provide a drain that will allow the cavity to heal from the inside outwards. The boil may be painful for 5 to 7 days. Most people with a boil do not have high fevers. Your abscess, if seen early, may not have localized, and may not have been lanced. If not, another appointment may be required for this if it does not get better on its own or with medications. HOME CARE INSTRUCTIONS   Only take over-the-counter or prescription medicines for pain, discomfort, or fever as directed by your caregiver.  When you bathe, soak and then remove gauze or iodoform packs at least daily or as directed by your caregiver. You may then wash the wound gently with mild soapy water. Repack with gauze or do as your caregiver directs. SEEK IMMEDIATE MEDICAL CARE IF:   You develop increased pain, swelling, redness, drainage, or bleeding in the wound site.  You develop signs of generalized infection including muscle aches, chills, fever, or a general ill feeling.  An oral temperature above 102 F (38.9 C) develops, not controlled by medication. See your caregiver for a recheck if you develop any of the symptoms described above. If medications (antibiotics) were prescribed, take them as directed. Document Released: 03/15/2005 Document Revised: 11/19/2011 Document Reviewed: 11/10/2007 Exit[employer] Patient Information 2014 [employer].   Abscess An abscess is an infected area that contains a collection of pus and debris.It can  occur in almost any part of the body. An abscess is also known as a furuncle or boil. CAUSES  An abscess occurs when tissue gets infected. This can occur from blockage of oil or sweat glands, infection of hair follicles, or a minor injury to the skin. As the body tries to fight the infection, pus collects in the area and creates pressure under the skin. This pressure causes pain. People with weakened immune systems have difficulty fighting infections and get certain abscesses more often.  SYMPTOMS Usually an abscess develops on the skin and becomes a painful mass that is red, warm, and tender. If the abscess forms under the skin, you may feel a moveable soft area under the skin. Some abscesses break open (rupture) on their own, but most will continue to get worse without care. The infection can spread deeper into the body and eventually into the bloodstream, causing you to feel ill.  DIAGNOSIS  Your caregiver will take your medical history and perform a physical exam. A sample of fluid may also be taken from the abscess to determine what is causing your infection. TREATMENT  Your caregiver may prescribe antibiotic medicines to fight the infection. However, taking antibiotics alone usually does not cure an abscess. Your caregiver may need to make a small cut (incision) in the abscess to drain the pus. In some cases, gauze is packed into the abscess to reduce pain and to continue draining the area. HOME CARE INSTRUCTIONS   Only take over-the-counter or prescription medicines for pain, discomfort, or fever as directed by your caregiver.  If   you were prescribed antibiotics, take them as directed. Finish them even if you start to feel better.  If gauze is used, follow your caregiver's directions for changing the gauze.  To avoid spreading the infection:  Keep your draining abscess covered with a bandage.  Wash your hands well.  Do not share personal care items, towels, or whirlpools with  others.  Avoid skin contact with others.  Keep your skin and clothes clean around the abscess.  Keep all follow-up appointments as directed by your caregiver. SEEK MEDICAL CARE IF:   You have increased pain, swelling, redness, fluid drainage, or bleeding.  You have muscle aches, chills, or a general ill feeling.  You have a fever. MAKE SURE YOU:   Understand these instructions.  Will watch your condition.  Will get help right away if you are not doing well or get worse. Document Released: 06/06/2005 Document Revised: 02/26/2012 Document Reviewed: 11/09/2011 [employer] Patient Information 2014 [employer].  

## 2014-01-05 NOTE — ED Notes (Signed)
Pt noticed small abscess on right inner thigh on Sunday. Pt states yesterday it got very swollen and worse this morning.

## 2014-01-11 NOTE — ED Provider Notes (Signed)
Medical screening examination/treatment/procedure(s) were performed by non-physician practitioner and as supervising physician I was immediately available for consultation/collaboration.   EKG Interpretation None        Saddie Benders. Dorna Mai, MD 01/11/14 0867

## 2014-05-27 ENCOUNTER — Emergency Department (HOSPITAL_COMMUNITY): Payer: Medicaid Other

## 2014-05-27 ENCOUNTER — Encounter (HOSPITAL_COMMUNITY): Payer: Self-pay | Admitting: Emergency Medicine

## 2014-05-27 ENCOUNTER — Emergency Department (HOSPITAL_COMMUNITY)
Admission: EM | Admit: 2014-05-27 | Discharge: 2014-05-27 | Disposition: A | Discharge status: Home / Self Care | Payer: Medicaid Other | Attending: Emergency Medicine | Admitting: Emergency Medicine

## 2014-05-27 DIAGNOSIS — J45901 Unspecified asthma with (acute) exacerbation: Secondary | ICD-10-CM | POA: Insufficient documentation

## 2014-05-27 DIAGNOSIS — J069 Acute upper respiratory infection, unspecified: Secondary | ICD-10-CM | POA: Diagnosis not present

## 2014-05-27 DIAGNOSIS — Z87891 Personal history of nicotine dependence: Secondary | ICD-10-CM | POA: Diagnosis not present

## 2014-05-27 DIAGNOSIS — Z79899 Other long term (current) drug therapy: Secondary | ICD-10-CM | POA: Diagnosis not present

## 2014-05-27 DIAGNOSIS — R079 Chest pain, unspecified: Secondary | ICD-10-CM | POA: Diagnosis present

## 2014-05-27 DIAGNOSIS — IMO0002 Reserved for concepts with insufficient information to code with codable children: Secondary | ICD-10-CM | POA: Diagnosis not present

## 2014-05-27 LAB — RAPID STREP SCREEN (MED CTR MEBANE ONLY): STREPTOCOCCUS, GROUP A SCREEN (DIRECT): NEGATIVE

## 2014-05-27 MED ORDER — ALBUTEROL SULFATE HFA 108 (90 BASE) MCG/ACT IN AERS: 2.0000 | INHALATION_SPRAY | RESPIRATORY_TRACT | Status: DC | PRN

## 2014-05-27 NOTE — ED Provider Notes (Signed)
CSN: [id number]     Arrival date & time 05/27/14  0756 History  This chart was scribed for non-physician practitioner Cleatrice Burke working with Carmin Muskrat, MD by Rayfield Citizen, ED Scribe. This patient was seen in room TR06C/TR06C and the patient's care was started at 9:45 AM.      Chief Complaint  Patient presents with  . Chest Pain   The history is provided by the patient. No language interpreter was used.    HPI Comments: Taylor Wilkinson is a 38 y.o. female who presents to the Emergency Department complaining of gradual onset, constant, worsening sore throat, beginning yesterday, with associated dry cough, chest discomfort, congestion, sinus pressure and chills. She took an OTC decongestant, Mucinex DM, yesterday with no relief. She denies fever. She is concerned because she is going on a cruise and would like to be better by then.    Past Medical History  Diagnosis Date  . Asthma   . Bronchitis   . Miscarriage    Past Surgical History  Procedure Laterality Date  . No past surgeries     Family History  Problem Relation Age of Onset  . Hypertension Mother   . Diabetes Mother   . Asthma Brother    History  Substance Use Topics  . Smoking status: Former Smoker -- 0.20 packs/day for 10 years    Quit date: 02/04/2013  . Smokeless tobacco: Not on file     Comment: 3-6 cigs a day/03/02/13  . Alcohol Use: Yes     Comment: social   OB History   Grav Para Term Preterm Abortions TAB SAB Ect Mult Living   6 4 4  1  1   4      Review of Systems  Constitutional: Positive for chills. Negative for fever.  HENT: Positive for congestion, sinus pressure and sore throat.   Respiratory: Positive for cough.   All other systems reviewed and are negative.    Allergies  Review of patient's allergies indicates no known allergies.  Home Medications   Prior to Admission medications   Medication Sig Start Date End Date Taking? Authorizing Provider  dextromethorphan-guaiFENesin  (MUCINEX DM) 30-600 MG per 12 hr tablet Take 2 tablets by mouth 2 (two) times daily.   Yes Historical Provider, MD  fluticasone (FLOVENT HFA) 110 MCG/ACT inhaler Inhale 2 puffs into the lungs daily as needed. 03/02/13  Yes Rigoberto Noel, MD  albuterol (PROVENTIL HFA;VENTOLIN HFA) 108 (90 BASE) MCG/ACT inhaler Inhale 2 puffs into the lungs every 6 (six) hours as needed. For shortness of breath 01/05/13   Sorin C Marye Round, MD   BP 120/77  Pulse 88  Temp(Src) 97.9 F (36.6 C) (Oral)  Resp 16  Ht 5\' 6"  (1.676 m)  Wt 175 lb (79.379 kg)  BMI 28.26 kg/m2  SpO2 99%  LMP 05/20/2014 Physical Exam  Nursing note and vitals reviewed. Constitutional: She is oriented to person, place, and time. She appears well-developed and well-nourished. No distress.  HENT:  Head: Normocephalic and atraumatic.  Right Ear: Tympanic membrane and external ear normal.  Left Ear: Tympanic membrane and external ear normal.  Nose: Nose normal.  Mouth/Throat: Oropharynx is clear and moist. No oropharyngeal exudate, posterior oropharyngeal edema, posterior oropharyngeal erythema or tonsillar abscesses.  Eyes: Conjunctivae are normal.  Neck: Normal range of motion.  Cardiovascular: Normal rate, regular rhythm and normal heart sounds.   Pulmonary/Chest: Effort normal. No stridor. No respiratory distress. She has wheezes. She has no rales.  Mild wheezing  Abdominal: Soft. She exhibits no distension.  Musculoskeletal: Normal range of motion.  Neurological: She is alert and oriented to person, place, and time. She has normal strength.  Skin: Skin is warm and dry. She is not diaphoretic. No erythema.  Psychiatric: She has a normal mood and affect. Her behavior is normal.    ED Course  Procedures  DIAGNOSTIC STUDIES: Oxygen Saturation is 99% on RA, normal by my interpretation.    COORDINATION OF CARE: 9:47 AM Discussed clinical suspicion of a virus; explained potential home care and a prescription of an inhaler for wheezing  relief. Patient agreed to plan.   Labs Review Labs Reviewed  RAPID STREP SCREEN  CULTURE, GROUP A STREP    Imaging Review No results found. CXR: No active cardiopulmonary disease.    EKG Interpretation None      MDM   Final diagnoses:  URI (upper respiratory infection)   Pt CXR negative for acute infiltrate. Patients symptoms are consistent with URI, likely viral etiology. Discussed that antibiotics are not indicated for viral infections. Pt will be discharged with symptomatic treatment.  Verbalizes understanding and is agreeable with plan. Pt is hemodynamically stable & in NAD prior to dc.   I personally performed the services described in this documentation, which was scribed in my presence. The recorded information has been reviewed and is accurate.     Elwyn Lade, PA-C 05/27/14 1023

## 2014-05-27 NOTE — Discharge Instructions (Signed)
Upper Respiratory Infection, Adult An upper respiratory infection (URI) is also sometimes known as the common cold. The upper respiratory tract includes the nose, sinuses, throat, trachea, and bronchi. Bronchi are the airways leading to the lungs. Most people improve within 1 week, but symptoms can last up to 2 weeks. A residual cough may last even longer.  CAUSES Many different viruses can infect the tissues lining the upper respiratory tract. The tissues become irritated and inflamed and often become very moist. Mucus production is also common. A cold is contagious. You can easily spread the virus to others by oral contact. This includes kissing, sharing a glass, coughing, or sneezing. Touching your mouth or nose and then touching a surface, which is then touched by another person, can also spread the virus. SYMPTOMS  Symptoms typically develop 1 to 3 days after you come in contact with a cold virus. Symptoms vary from person to person. They may include:  Runny nose.  Sneezing.  Nasal congestion.  Sinus irritation.  Sore throat.  Loss of voice (laryngitis).  Cough.  Fatigue.  Muscle aches.  Loss of appetite.  Headache.  Low-grade fever. DIAGNOSIS  You might diagnose your own cold based on familiar symptoms, since most people get a cold 2 to 3 times a year. Your caregiver can confirm this based on your exam. Most importantly, your caregiver can check that your symptoms are not due to another disease such as strep throat, sinusitis, pneumonia, asthma, or epiglottitis. Blood tests, throat tests, and X-rays are not necessary to diagnose a common cold, but they may sometimes be helpful in excluding other more serious diseases. Your caregiver will decide if any further tests are required. RISKS AND COMPLICATIONS  You may be at risk for a more severe case of the common cold if you smoke cigarettes, have chronic heart disease (such as heart failure) or lung disease (such as asthma), or if  you have a weakened immune system. The very young and very old are also at risk for more serious infections. Bacterial sinusitis, middle ear infections, and bacterial pneumonia can complicate the common cold. The common cold can worsen asthma and chronic obstructive pulmonary disease (COPD). Sometimes, these complications can require emergency medical care and may be life-threatening. PREVENTION  The best way to protect against getting a cold is to practice good hygiene. Avoid oral or hand contact with people with cold symptoms. Wash your hands often if contact occurs. There is no clear evidence that vitamin C, vitamin E, echinacea, or exercise reduces the chance of developing a cold. However, it is always recommended to get plenty of rest and practice good nutrition. TREATMENT  Treatment is directed at relieving symptoms. There is no cure. Antibiotics are not effective, because the infection is caused by a virus, not by bacteria. Treatment may include:  Increased fluid intake. Sports drinks offer valuable electrolytes, sugars, and fluids.  Breathing heated mist or steam (vaporizer or shower).  Eating chicken soup or other clear broths, and maintaining good nutrition.  Getting plenty of rest.  Using gargles or lozenges for comfort.  Controlling fevers with ibuprofen or acetaminophen as directed by your caregiver.  Increasing usage of your inhaler if you have asthma. Zinc gel and zinc lozenges, taken in the first 24 hours of the common cold, can shorten the duration and lessen the severity of symptoms. Pain medicines may help with fever, muscle aches, and throat pain. A variety of non-prescription medicines are available to treat congestion and runny nose. Your caregiver   can make recommendations and may suggest nasal or lung inhalers for other symptoms.  HOME CARE INSTRUCTIONS   Only take over-the-counter or prescription medicines for pain, discomfort, or fever as directed by your  caregiver.  Use a warm mist humidifier or inhale steam from a shower to increase air moisture. This may keep secretions moist and make it easier to breathe.  Drink enough water and fluids to keep your urine clear or pale yellow.  Rest as needed.  Return to work when your temperature has returned to normal or as your caregiver advises. You may need to stay home longer to avoid infecting others. You can also use a face mask and careful hand washing to prevent spread of the virus. SEEK MEDICAL CARE IF:   After the first few days, you feel you are getting worse rather than better.  You need your caregiver's advice about medicines to control symptoms.  You develop chills, worsening shortness of breath, or brown or red sputum. These may be signs of pneumonia.  You develop yellow or brown nasal discharge or pain in the face, especially when you bend forward. These may be signs of sinusitis.  You develop a fever, swollen neck glands, pain with swallowing, or white areas in the back of your throat. These may be signs of strep throat. SEEK IMMEDIATE MEDICAL CARE IF:   You have a fever.  You develop severe or persistent headache, ear pain, sinus pain, or chest pain.  You develop wheezing, a prolonged cough, cough up blood, or have a change in your usual mucus (if you have chronic lung disease).  You develop sore muscles or a stiff neck. Document Released: 02/20/2001 Document Revised: 11/19/2011 Document Reviewed: 12/02/2013 Exit[employer] Patient Information 2015 [employer]. This information is not intended to replace advice given to you by your health care provider. Make sure you discuss any questions you have with your health care provider.  

## 2014-05-27 NOTE — ED Notes (Signed)
PT is going on a cruise on Sat and she wants to make sure her chest pain, cough, sore throat are better before she goes.  C/o sternal chest pain.

## 2014-05-27 NOTE — ED Provider Notes (Signed)
  Medical screening examination/treatment/procedure(s) were performed by non-physician practitioner and as supervising physician I was immediately available for consultation/collaboration.    Carmin Muskrat, MD 05/27/14 [phone]

## 2014-05-29 LAB — CULTURE, GROUP A STREP

## 2014-07-12 ENCOUNTER — Encounter (HOSPITAL_COMMUNITY): Payer: Self-pay | Admitting: Emergency Medicine

## 2015-03-30 ENCOUNTER — Other Ambulatory Visit (HOSPITAL_BASED_OUTPATIENT_CLINIC_OR_DEPARTMENT_OTHER): Payer: Self-pay | Admitting: Obstetrics

## 2015-03-30 DIAGNOSIS — Z1231 Encounter for screening mammogram for malignant neoplasm of breast: Secondary | ICD-10-CM

## 2015-04-14 ENCOUNTER — Ambulatory Visit (HOSPITAL_COMMUNITY): Payer: Medicaid Other

## 2015-04-20 IMAGING — CR DG CHEST 2V
2 series · 2 of 2 positions shown · non-contrast
Comparison: 01/03/2013

CLINICAL DATA: Chest pain

EXAM:
CHEST  2 VIEW

[w chest pa]
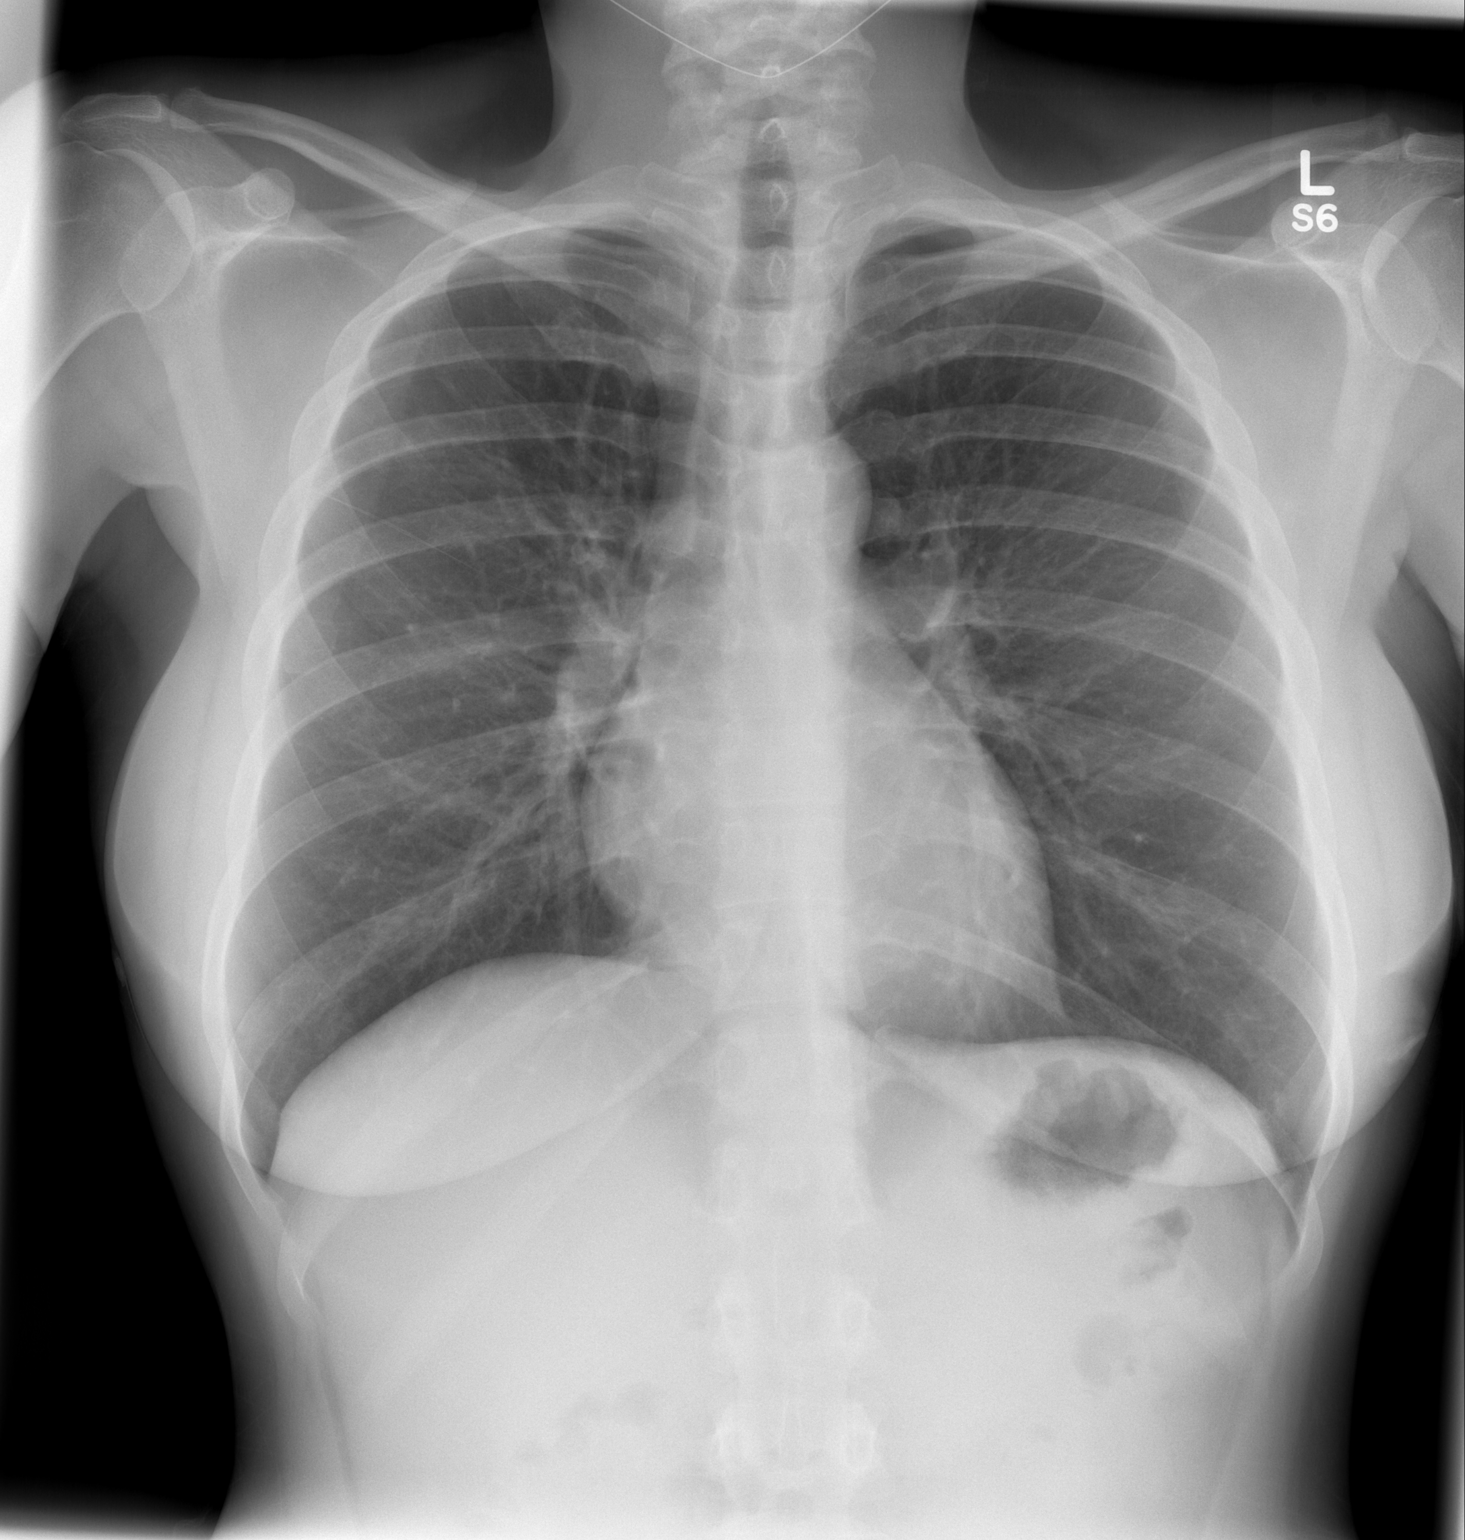

[w chest lat]
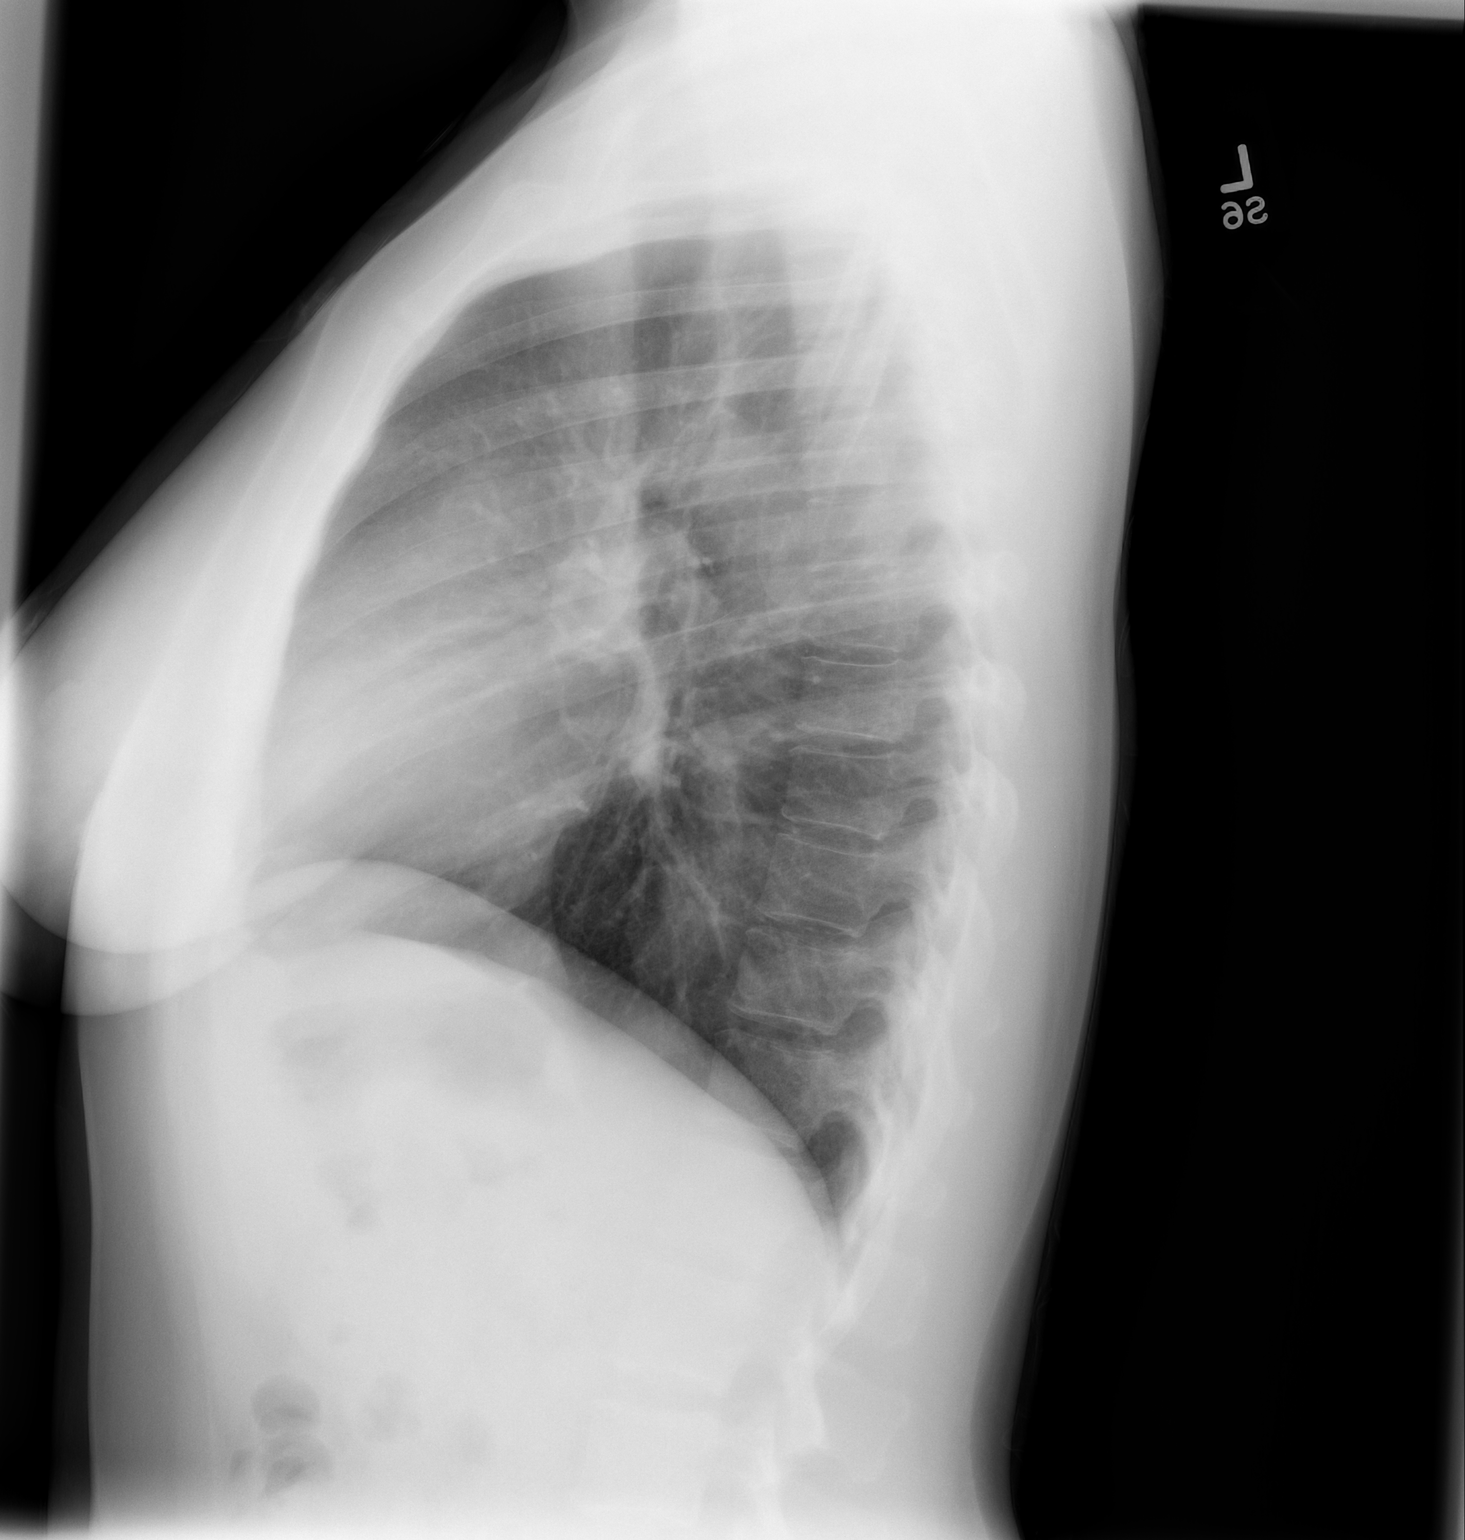

[2 of 2 positions shown; findings below may reference images not displayed]

FINDINGS: The heart size and mediastinal contours are within normal limits.
Both lungs are clear. The visualized skeletal structures are
unremarkable.
IMPRESSION: No active cardiopulmonary disease.

## 2015-04-22 ENCOUNTER — Ambulatory Visit (HOSPITAL_COMMUNITY)
Admission: RE | Admit: 2015-04-22 | Discharge: 2015-04-22 | Disposition: A | Discharge status: Home / Self Care | Payer: Medicaid Other | Source: Ambulatory Visit | Attending: Obstetrics | Admitting: Obstetrics

## 2015-04-22 DIAGNOSIS — Z1231 Encounter for screening mammogram for malignant neoplasm of breast: Secondary | ICD-10-CM | POA: Insufficient documentation

## 2015-11-15 ENCOUNTER — Emergency Department (HOSPITAL_COMMUNITY)
Admission: EM | Admit: 2015-11-15 | Discharge: 2015-11-15 | Disposition: A | Discharge status: Home / Self Care | Payer: Medicaid Other | Attending: Emergency Medicine | Admitting: Emergency Medicine

## 2015-11-15 ENCOUNTER — Encounter (HOSPITAL_COMMUNITY): Payer: Self-pay | Admitting: Emergency Medicine

## 2015-11-15 ENCOUNTER — Emergency Department (HOSPITAL_COMMUNITY): Payer: Medicaid Other

## 2015-11-15 DIAGNOSIS — R0602 Shortness of breath: Secondary | ICD-10-CM | POA: Diagnosis present

## 2015-11-15 DIAGNOSIS — Z87891 Personal history of nicotine dependence: Secondary | ICD-10-CM | POA: Diagnosis not present

## 2015-11-15 DIAGNOSIS — Z79899 Other long term (current) drug therapy: Secondary | ICD-10-CM | POA: Diagnosis not present

## 2015-11-15 DIAGNOSIS — J111 Influenza due to unidentified influenza virus with other respiratory manifestations: Secondary | ICD-10-CM | POA: Insufficient documentation

## 2015-11-15 DIAGNOSIS — J9801 Acute bronchospasm: Secondary | ICD-10-CM

## 2015-11-15 DIAGNOSIS — J45901 Unspecified asthma with (acute) exacerbation: Secondary | ICD-10-CM | POA: Diagnosis not present

## 2015-11-15 LAB — CBC
HEMATOCRIT: 39.4 % (ref 36.0–46.0)
HEMOGLOBIN: 13.9 g/dL (ref 12.0–15.0)
MCH: 33.1 pg (ref 26.0–34.0)
MCHC: 35.3 g/dL (ref 30.0–36.0)
MCV: 93.8 fL (ref 78.0–100.0)
Platelets: 185 10*3/uL (ref 150–400)
RBC: 4.2 MIL/uL (ref 3.87–5.11)
RDW: 14 % (ref 11.5–15.5)
WBC: 4.9 10*3/uL (ref 4.0–10.5)

## 2015-11-15 LAB — COMPREHENSIVE METABOLIC PANEL
ALBUMIN: 3.5 g/dL (ref 3.5–5.0)
ALK PHOS: 64 U/L (ref 38–126)
ALT: 10 U/L — AB (ref 14–54)
AST: 14 U/L — AB (ref 15–41)
Anion gap: 7 (ref 5–15)
BILIRUBIN TOTAL: 0.4 mg/dL (ref 0.3–1.2)
BUN: 9 mg/dL (ref 6–20)
CO2: 23 mmol/L (ref 22–32)
Calcium: 9.1 mg/dL (ref 8.9–10.3)
Chloride: 110 mmol/L (ref 101–111)
Creatinine, Ser: 0.79 mg/dL (ref 0.44–1.00)
GFR calc Af Amer: >60 mL/min (ref >60)
GFR calc non Af Amer: >60 mL/min (ref >60)
GLUCOSE: 107 mg/dL — AB (ref 65–99)
Potassium: 3.6 mmol/L (ref 3.5–5.1)
SODIUM: 140 mmol/L (ref 135–145)
Total Protein: 6.2 g/dL — ABNORMAL LOW (ref 6.5–8.1)

## 2015-11-15 MED ORDER — ONDANSETRON HCL 8 MG PO TABS: 8.0000 mg | ORAL_TABLET | Freq: Three times a day (TID) | ORAL | Status: DC | PRN

## 2015-11-15 MED ORDER — ALBUTEROL SULFATE (2.5 MG/3ML) 0.083% IN NEBU: 2.5000 mg | INHALATION_SOLUTION | RESPIRATORY_TRACT | Status: DC | PRN

## 2015-11-15 MED ORDER — PREDNISONE 20 MG PO TABS: 20.0000 mg | ORAL_TABLET | Freq: Two times a day (BID) | ORAL | Status: DC

## 2015-11-15 MED ORDER — PREDNISONE 20 MG PO TABS
60.0000 mg | ORAL_TABLET | Freq: Once | ORAL | Status: AC
  Administered 2015-11-15: 60 mg via ORAL
  Filled 2015-11-15: qty 3

## 2015-11-15 MED ORDER — OSELTAMIVIR PHOSPHATE 75 MG PO CAPS
75.0000 mg | ORAL_CAPSULE | Freq: Once | ORAL | Status: AC
  Administered 2015-11-15: 75 mg via ORAL
  Filled 2015-11-15: qty 1

## 2015-11-15 MED ORDER — ONDANSETRON 4 MG PO TBDP
4.0000 mg | ORAL_TABLET | Freq: Once | ORAL | Status: AC
  Administered 2015-11-15: 4 mg via ORAL
  Filled 2015-11-15: qty 1

## 2015-11-15 MED ORDER — OSELTAMIVIR PHOSPHATE 75 MG PO CAPS: 75.0000 mg | ORAL_CAPSULE | Freq: Two times a day (BID) | ORAL | Status: DC

## 2015-11-15 NOTE — ED Notes (Signed)
Pt ambulates independently and with steady gait at time of discharge. Discharge instructions and follow up information reviewed with patient. No other questions or concerns voiced at this time. RX x 4 given. 

## 2015-11-15 NOTE — ED Provider Notes (Signed)
CSN: [id number]     Arrival date & time 11/15/15  0746 History   First MD Initiated Contact with Patient 11/15/15 [phone]     Chief Complaint  Patient presents with  . Shortness of Breath  . Generalized Body Aches     (Consider location/radiation/quality/duration/timing/severity/associated sxs/prior Treatment) HPI   Taylor Wilkinson is a 40 y.o. female who presents for evaluation of shortness breath, cough, which is productive, and general body achiness, for 2 days. Shows has rhinorrhea and mild sore throat. She did not take flu immunization this year. She is using bronchodilators at home. Both inhaled, and nebulized. She is out of her nebulized medication. She was unable to work today because of the discomfort. There are no other no modifying factors.   Past Medical History  Diagnosis Date  . Asthma   . Bronchitis   . Miscarriage    Past Surgical History  Procedure Laterality Date  . No past surgeries     Family History  Problem Relation Age of Onset  . Hypertension Mother   . Diabetes Mother   . Asthma Brother    Social History  Substance Use Topics  . Smoking status: Former Smoker -- 0.20 packs/day for 10 years    Quit date: 02/04/2013  . Smokeless tobacco: None     Comment: 3-6 cigs a day/03/02/13  . Alcohol Use: Yes     Comment: social   OB History    Gravida Para Term Preterm AB TAB SAB Ectopic Multiple Living   6 4 4  1  1   4      Review of Systems  All other systems reviewed and are negative.     Allergies  Review of patient's allergies indicates no known allergies.  Home Medications   Prior to Admission medications   Medication Sig Start Date End Date Taking? Authorizing Provider  albuterol (PROVENTIL) (2.5 MG/3ML) 0.083% nebulizer solution Take 3 mLs (2.5 mg total) by nebulization every 4 (four) hours as needed for wheezing or shortness of breath. 11/15/15   Daleen Bo, MD  ondansetron (ZOFRAN) 8 MG tablet Take 1 tablet (8 mg total) by mouth every 8  (eight) hours as needed for nausea or vomiting. 11/15/15   Daleen Bo, MD  oseltamivir (TAMIFLU) 75 MG capsule Take 1 capsule (75 mg total) by mouth 2 (two) times daily. 11/15/15   Daleen Bo, MD  predniSONE (DELTASONE) 20 MG tablet Take 1 tablet (20 mg total) by mouth 2 (two) times daily. 11/15/15   Daleen Bo, MD   BP 119/71 mmHg  Pulse 102  Temp(Src) 98.6 F (37 C) (Oral)  Resp 17  Ht 5\' 6"  (1.676 m)  Wt 159 lb (72.122 kg)  BMI 25.68 kg/m2  SpO2 100%  LMP 10/11/2015 Physical Exam  Constitutional: She is oriented to person, place, and time. She appears well-developed and well-nourished.  HENT:  Head: Normocephalic and atraumatic.  Right Ear: External ear normal.  Left Ear: External ear normal.  Eyes: Conjunctivae and EOM are normal. Pupils are equal, round, and reactive to light.  Neck: Normal range of motion and phonation normal. Neck supple.  Cardiovascular: Normal rate, regular rhythm and normal heart sounds.   Pulmonary/Chest: Effort normal. No respiratory distress. She has no rales. She exhibits no tenderness and no bony tenderness.  Somewhat decreased abnormal bilaterally with scattered expiratory wheezes.  Abdominal: Soft. There is no tenderness.  Musculoskeletal: Normal range of motion.  Neurological: She is alert and oriented to person, place, and time. No  cranial nerve deficit or sensory deficit. She exhibits normal muscle tone. Coordination normal.  Skin: Skin is warm, dry and intact.  Psychiatric: She has a normal mood and affect. Her behavior is normal. Judgment and thought content normal.  Nursing note and vitals reviewed.   ED Course  Procedures (including critical care time)  Medications  oseltamivir (TAMIFLU) capsule 75 mg (75 mg Oral Given 11/15/15 1013)  predniSONE (DELTASONE) tablet 60 mg (60 mg Oral Given 11/15/15 1013)  ondansetron (ZOFRAN-ODT) disintegrating tablet 4 mg (4 mg Oral Given 11/15/15 1104)    Patient Vitals for the past 24 hrs:  BP Temp  Temp src Pulse Resp SpO2 Height Weight  11/15/15 1312 119/71 mmHg 98.6 F (37 C) Oral 102 17 100 % - -  11/15/15 1202 - 99.2 F (37.3 C) Oral - - - - -  11/15/15 1147 107/69 mmHg - - 100 18 100 % - -  11/15/15 1030 112/74 mmHg - - 103 16 100 % - -  11/15/15 1015 111/73 mmHg - - 100 16 100 % - -  11/15/15 1000 110/69 mmHg - - 92 16 100 % - -  11/15/15 0945 100/66 mmHg - - 96 16 100 % - -  11/15/15 0930 102/63 mmHg - - 106 16 100 % - -  11/15/15 0920 103/67 mmHg - - 98 16 100 % - -  11/15/15 0804 129/84 mmHg 99 F (37.2 C) Oral 100 16 98 % 5\' 6"  (1.676 m) 159 lb (72.122 kg)    At discharge- Reevaluation with update and discussion. After initial assessment and treatment, an updated evaluation reveals no additional complaints. Findings discussed with the patient, all questions were answered. Xyler Terpening L    Labs Review Labs Reviewed  COMPREHENSIVE METABOLIC PANEL - Abnormal; Notable for the following:    Glucose, Bld 107 (*)    Total Protein 6.2 (*)    AST 14 (*)    ALT 10 (*)    All other components within normal limits  CBC    Imaging Review Dg Chest 2 View  11/15/2015  CLINICAL DATA:  Sore throat, shortness of breath over night, fever, chills, history of asthma, smoker EXAM: CHEST  2 VIEW COMPARISON:  05/27/2014 FINDINGS: Cardiomediastinal silhouette is stable. No infiltrate or pulmonary edema. Mild perihilar increased bronchial markings without focal consolidation. Minimal hyperinflation. Bony thorax is unremarkable. IMPRESSION: No infiltrate or pulmonary edema. Minimal hyperinflation. Mild perihilar increased bronchial markings without focal consolidation. Electronically Signed   By: Lahoma Crocker M.D.   On: 11/15/2015 08:36   I have personally reviewed and evaluated these images and lab results as part of my medical decision-making.   EKG Interpretation None      MDM   Final diagnoses:  Influenza  Bronchospasm    Valuation, consistent with influenza, and bronchospasm,  likely due to asthma. No pneumonia or sepsis.   Nursing Notes Reviewed/ Care Coordinated Applicable Imaging Reviewed Interpretation of Laboratory Data incorporated into ED treatment  The patient appears reasonably screened and/or stabilized for discharge and I doubt any other medical condition or other [facility] requiring further screening, evaluation, or treatment in the ED at this time prior to discharge.  Plan: Home Medications- Tamiflu, prednisone, albuterol, Zofran; Home Treatments- rest; return here if the recommended treatment, does not improve the symptoms; Recommended follow up- PCP, when necessary     Daleen Bo, MD 11/15/15 1410

## 2015-11-15 NOTE — ED Notes (Signed)
Pt here from home with c/o sob, cough and general body aches

## 2015-11-15 NOTE — Discharge Instructions (Signed)
Asthma, Acute Bronchospasm °Acute bronchospasm caused by asthma is also referred to as an asthma attack. Bronchospasm means your air passages become narrowed. The narrowing is caused by inflammation and tightening of the muscles in the air tubes (bronchi) in your lungs. This can make it hard to breathe or cause you to wheeze and cough. °CAUSES °Possible triggers are: °· Animal dander from the skin, hair, or feathers of animals. °· Dust mites contained in house dust. °· Cockroaches. °· Pollen from trees or grass. °· Mold. °· Cigarette or tobacco smoke. °· Air pollutants such as dust, household cleaners, hair sprays, aerosol sprays, paint fumes, strong chemicals, or strong odors. °· Cold air or weather changes. Cold air may trigger inflammation. Winds increase molds and pollens in the air. °· Strong emotions such as crying or laughing hard. °· Stress. °· Certain medicines such as aspirin or beta-blockers. °· Sulfites in foods and drinks, such as dried fruits and wine. °· Infections or inflammatory conditions, such as a flu, cold, or inflammation of the nasal membranes (rhinitis). °· Gastroesophageal reflux disease (GERD). GERD is a condition where stomach acid backs up into your esophagus. °· Exercise or strenuous activity. °SIGNS AND SYMPTOMS  °· Wheezing. °· Excessive coughing, particularly at night. °· Chest tightness. °· Shortness of breath. °DIAGNOSIS  °Your health care provider will ask you about your medical history and perform a physical exam. A chest X-ray or blood testing may be performed to look for other causes of your symptoms or other conditions that may have triggered your asthma attack.  °TREATMENT  °Treatment is aimed at reducing inflammation and opening up the airways in your lungs.  Most asthma attacks are treated with inhaled medicines. These include quick relief or rescue medicines (such as bronchodilators) and controller medicines (such as inhaled corticosteroids). These medicines are sometimes  given through an inhaler or a nebulizer. Systemic steroid medicine taken by mouth or given through an IV tube also can be used to reduce the inflammation when an attack is moderate or severe. Antibiotic medicines are only used if a bacterial infection is present.  °HOME CARE INSTRUCTIONS  °· Rest. °· Drink plenty of liquids. This helps the mucus to remain thin and be easily coughed up. Only use caffeine in moderation and do not use alcohol until you have recovered from your illness. °· Do not smoke. Avoid being exposed to secondhand smoke. °· You play a critical role in keeping yourself in good health. Avoid exposure to things that cause you to wheeze or to have breathing problems. °· Keep your medicines up-to-date and available. Carefully follow your health care provider's treatment plan. °· Take your medicine exactly as prescribed. °· When pollen or pollution is bad, keep windows closed and use an air conditioner or go to places with air conditioning. °· Asthma requires careful medical care. See your health care provider for a follow-up as advised. If you are more than [redacted] weeks pregnant and you were prescribed any new medicines, let your obstetrician know about the visit and how you are doing. Follow up with your health care provider as directed. °· After you have recovered from your asthma attack, make an appointment with your outpatient doctor to talk about ways to reduce the likelihood of future attacks. If you do not have a doctor who manages your asthma, make an appointment with a primary care doctor to discuss your asthma. °SEEK IMMEDIATE MEDICAL CARE IF:  °· You are getting worse. °· You have trouble breathing. If severe, call your local   who manages your asthma, make an appointment with a primary care doctor to discuss your asthma.  SEEK IMMEDIATE MEDICAL CARE IF:   · You are getting worse.  · You have trouble breathing. If severe, call your local emergency services (911 in the [place]  · You develop chest pain or discomfort.  · You are vomiting.  · You are not able to keep fluids down.  · You are coughing up yellow, green, brown, or bloody sputum.  · You have a fever and your symptoms suddenly get worse.  · You have  trouble swallowing.  MAKE SURE YOU:   · Understand these instructions.  · Will watch your condition.  · Will get help right away if you are not doing well or get worse.     This information is not intended to replace advice given to you by your health care provider. Make sure you discuss any questions you have with your health care provider.     Document Released: 12/12/2006 Document Revised: 09/01/2013 Document Reviewed: 03/04/2013  Elsevier Interactive Patient Education ©2016 [employer]      Influenza, Adult  Influenza ("the flu") is a viral infection of the respiratory tract. It occurs more often in winter months because people spend more time in close contact with one another. Influenza can make you feel very sick. Influenza easily spreads from person to person (contagious).  CAUSES   Influenza is caused by a virus that infects the respiratory tract. You can catch the virus by breathing in droplets from an infected person's cough or sneeze. You can also catch the virus by touching something that was recently contaminated with the virus and then touching your mouth, nose, or eyes.  RISKS AND COMPLICATIONS  You may be at risk for a more severe case of influenza if you smoke cigarettes, have diabetes, have chronic heart disease (such as heart failure) or lung disease (such as asthma), or if you have a weakened immune system. Elderly people and pregnant women are also at risk for more serious infections. The most common problem of influenza is a lung infection (pneumonia). Sometimes, this problem can require emergency medical care and may be life threatening.  SIGNS AND SYMPTOMS   Symptoms typically last 4 to 10 days and may include:  · Fever.  · Chills.  · Headache, body aches, and muscle aches.  · Sore throat.  · Chest discomfort and cough.  · Poor appetite.  · Weakness or feeling tired.  · Dizziness.  · Nausea or vomiting.  DIAGNOSIS   Diagnosis of influenza is often made based on your history and a physical  exam. A nose or throat swab test can be done to confirm the diagnosis.  TREATMENT   In mild cases, influenza goes away on its own. Treatment is directed at relieving symptoms. For more severe cases, your health care provider may prescribe antiviral medicines to shorten the sickness. Antibiotic medicines are not effective because the infection is caused by a virus, not by bacteria.  HOME CARE INSTRUCTIONS  · Take medicines only as directed by your health care provider.  · Use a cool mist humidifier to make breathing easier.  · Get plenty of rest until your temperature returns to normal. This usually takes 3 to 4 days.  · Drink enough fluid to keep your urine clear or pale yellow.  · Cover your mouth and nose when coughing or sneezing, and wash your hands well to prevent the virus from spreading.  · Stay home from work or school until the fever is gone for at   least 1 full day.  PREVENTION   An annual influenza vaccination (flu shot) is the best way to avoid getting influenza. An annual flu shot is now routinely recommended for all adults in the [place]  SEEK MEDICAL CARE IF:  · You experience chest pain, your cough worsens, or you produce more mucus.  · You have nausea, vomiting, or diarrhea.  · Your fever returns or gets worse.  SEEK IMMEDIATE MEDICAL CARE IF:  · You have trouble breathing, you become short of breath, or your skin or nails become bluish.  · You have severe pain or stiffness in the neck.  · You develop a sudden headache, or pain in the face or ear.  · You have nausea or vomiting that you cannot control.  MAKE SURE YOU:   · Understand these instructions.  · Will watch your condition.  · Will get help right away if you are not doing well or get worse.     This information is not intended to replace advice given to you by your health care provider. Make sure you discuss any questions you have with your health care provider.     Document Released: 08/24/2000 Document Revised: 09/17/2014 Document Reviewed:  11/26/2011  Elsevier Interactive Patient Education ©2016 [employer]

## 2016-03-23 ENCOUNTER — Other Ambulatory Visit: Payer: Self-pay | Admitting: Obstetrics

## 2016-03-23 DIAGNOSIS — Z1231 Encounter for screening mammogram for malignant neoplasm of breast: Secondary | ICD-10-CM

## 2016-04-27 ENCOUNTER — Ambulatory Visit
Admission: RE | Admit: 2016-04-27 | Discharge: 2016-04-27 | Disposition: A | Discharge status: Home / Self Care | Payer: Medicaid Other | Source: Ambulatory Visit | Attending: Obstetrics | Admitting: Obstetrics

## 2016-04-27 DIAGNOSIS — Z1231 Encounter for screening mammogram for malignant neoplasm of breast: Secondary | ICD-10-CM

## 2016-05-01 ENCOUNTER — Ambulatory Visit: Payer: Self-pay | Admitting: Obstetrics

## 2016-05-12 ENCOUNTER — Encounter: Payer: Self-pay | Admitting: *Deleted

## 2016-05-12 LAB — LAB REPORT - SCANNED: Pap: NEGATIVE

## 2016-05-15 ENCOUNTER — Ambulatory Visit: Payer: Self-pay | Admitting: Obstetrics

## 2016-10-28 ENCOUNTER — Ambulatory Visit (HOSPITAL_COMMUNITY)
Admission: EM | Admit: 2016-10-28 | Discharge: 2016-10-28 | Disposition: A | Discharge status: Home / Self Care | Payer: Medicaid Other | Attending: Family Medicine | Admitting: Family Medicine

## 2016-10-28 DIAGNOSIS — J4541 Moderate persistent asthma with (acute) exacerbation: Secondary | ICD-10-CM

## 2016-10-28 MED ORDER — ALBUTEROL SULFATE HFA 108 (90 BASE) MCG/ACT IN AERS: 1.0000 | INHALATION_SPRAY | Freq: Four times a day (QID) | RESPIRATORY_TRACT | 1 refills | Status: DC | PRN

## 2016-10-28 MED ORDER — PREDNISONE 10 MG (21) PO TBPK: 10.0000 mg | ORAL_TABLET | Freq: Every day | ORAL | 0 refills | Status: DC

## 2016-10-28 MED ORDER — ALBUTEROL SULFATE (2.5 MG/3ML) 0.083% IN NEBU: 2.5000 mg | INHALATION_SOLUTION | RESPIRATORY_TRACT | 1 refills | Status: DC | PRN

## 2016-10-28 NOTE — ED Provider Notes (Signed)
CSN: [id number]     Arrival date & time 10/28/16  1458 History   None    Chief Complaint  Patient presents with  . Wheezing   (Consider location/radiation/quality/duration/timing/severity/associated sxs/prior Treatment) Patient c/o wheezing and sob.  She is coughing and she is out of MDI.   The history is provided by the patient.  Wheezing  Severity:  Moderate Severity compared to prior episodes:  Similar Onset quality:  Sudden Duration:  1 week Timing:  Constant Progression:  Worsening Chronicity:  New Relieved by:  Nothing Worsened by:  Nothing Ineffective treatments:  None tried Associated symptoms: fatigue     Past Medical History:  Diagnosis Date  . Asthma   . Bronchitis   . Miscarriage    Past Surgical History:  Procedure Laterality Date  . NO PAST SURGERIES     Family History  Problem Relation Age of Onset  . Hypertension Mother   . Diabetes Mother   . Asthma Brother    Social History  Substance Use Topics  . Smoking status: Former Smoker    Packs/day: 0.20    Years: 10.00    Quit date: 02/04/2013  . Smokeless tobacco: Not on file     Comment: 3-6 cigs a day/03/02/13  . Alcohol use Yes     Comment: social   OB History    Gravida Para Term Preterm AB Living   6 4 4   1 4    SAB TAB Ectopic Multiple Live Births   1             Review of Systems  Constitutional: Positive for fatigue.  HENT: Negative.   Eyes: Negative.   Respiratory: Positive for wheezing.   Gastrointestinal: Negative.   Endocrine: Negative.   Genitourinary: Negative.   Musculoskeletal: Negative.   Skin: Negative.   Allergic/Immunologic: Negative.   Neurological: Negative.   Hematological: Negative.   Psychiatric/Behavioral: Negative.     Allergies  Patient has no known allergies.  Home Medications   Prior to Admission medications   Medication Sig Start Date End Date Taking? Authorizing Provider  albuterol (PROVENTIL HFA;VENTOLIN HFA) 108 (90 Base) MCG/ACT inhaler  Inhale 1-2 puffs into the lungs every 6 (six) hours as needed for wheezing or shortness of breath. 10/28/16   Lysbeth Penner, FNP  albuterol (PROVENTIL) (2.5 MG/3ML) 0.083% nebulizer solution Take 3 mLs (2.5 mg total) by nebulization every 4 (four) hours as needed for wheezing or shortness of breath. 10/28/16   Lysbeth Penner, FNP  ondansetron (ZOFRAN) 8 MG tablet Take 1 tablet (8 mg total) by mouth every 8 (eight) hours as needed for nausea or vomiting. 11/15/15   Daleen Bo, MD  oseltamivir (TAMIFLU) 75 MG capsule Take 1 capsule (75 mg total) by mouth 2 (two) times daily. 11/15/15   Daleen Bo, MD  predniSONE (DELTASONE) 20 MG tablet Take 1 tablet (20 mg total) by mouth 2 (two) times daily. 11/15/15   Daleen Bo, MD  predniSONE (STERAPRED UNI-PAK 21 TAB) 10 MG (21) TBPK tablet Take 1 tablet (10 mg total) by mouth daily. Take 6-5-4-3-2-1 po qd 10/28/16   Lysbeth Penner, FNP   Meds Ordered and Administered this Visit  Medications - No data to display  BP 121/80 (BP Location: Left Arm)   Temp 98.8 F (37.1 C) (Oral)   Resp 16   LMP 10/27/2016   SpO2 100%  No data found.   Physical Exam  Constitutional: She appears well-developed and well-nourished.  HENT:  Head: Normocephalic  and atraumatic.  Right Ear: External ear normal.  Left Ear: External ear normal.  Mouth/Throat: Oropharynx is clear and moist.  Eyes: Conjunctivae and EOM are normal. Pupils are equal, round, and reactive to light.  Neck: Normal range of motion. Neck supple.  Cardiovascular: Normal rate, regular rhythm and normal heart sounds.   Pulmonary/Chest: Effort normal. She has wheezes.  Abdominal: Soft. Bowel sounds are normal.  Nursing note and vitals reviewed.   Urgent Care Course     Procedures (including critical care time)  Labs Review Labs Reviewed - No data to display  Imaging Review No results found.   Visual Acuity Review  Right Eye Distance:   Left Eye Distance:   Bilateral Distance:     Right Eye Near:   Left Eye Near:    Bilateral Near:         MDM   1. Moderate persistent asthma with exacerbation    Declines neb tx Albuterol MDI 2 puffs q 6 hours prn Albuterol 2.5/3cc neb tx q 6 hours Prednisone taper 10mg  6-5-4-3-2-1 po qd #21      Lysbeth Penner, FNP 10/28/16 1710

## 2016-12-11 DIAGNOSIS — H10413 Chronic giant papillary conjunctivitis, bilateral: Secondary | ICD-10-CM | POA: Diagnosis not present

## 2016-12-11 DIAGNOSIS — H40013 Open angle with borderline findings, low risk, bilateral: Secondary | ICD-10-CM | POA: Diagnosis not present

## 2016-12-11 DIAGNOSIS — H04123 Dry eye syndrome of bilateral lacrimal glands: Secondary | ICD-10-CM | POA: Diagnosis not present

## 2016-12-18 ENCOUNTER — Ambulatory Visit (HOSPITAL_COMMUNITY)
Admission: EM | Admit: 2016-12-18 | Discharge: 2016-12-18 | Disposition: A | Discharge status: Home / Self Care | Payer: Medicaid Other | Attending: Internal Medicine | Admitting: Internal Medicine

## 2016-12-18 ENCOUNTER — Encounter (HOSPITAL_COMMUNITY): Payer: Self-pay | Admitting: *Deleted

## 2016-12-18 DIAGNOSIS — R05 Cough: Secondary | ICD-10-CM

## 2016-12-18 DIAGNOSIS — R059 Cough, unspecified: Secondary | ICD-10-CM

## 2016-12-18 DIAGNOSIS — J208 Acute bronchitis due to other specified organisms: Secondary | ICD-10-CM

## 2016-12-18 MED ORDER — AZITHROMYCIN 250 MG PO TABS: 250.0000 mg | ORAL_TABLET | Freq: Every day | ORAL | 0 refills | Status: DC

## 2016-12-18 MED ORDER — ALBUTEROL SULFATE HFA 108 (90 BASE) MCG/ACT IN AERS: 1.0000 | INHALATION_SPRAY | Freq: Four times a day (QID) | RESPIRATORY_TRACT | 0 refills | Status: DC | PRN

## 2016-12-18 MED ORDER — METHYLPREDNISOLONE 4 MG PO TBPK: ORAL_TABLET | ORAL | 0 refills | Status: DC

## 2016-12-18 MED ORDER — BENZONATATE 100 MG PO CAPS: 200.0000 mg | ORAL_CAPSULE | Freq: Three times a day (TID) | ORAL | 0 refills | Status: DC | PRN

## 2016-12-18 NOTE — ED Triage Notes (Signed)
Pt  Reports    Cough  Shortness  Of  Breath          X   2  Days         pt  Re[orts   Brings   Up   Lots  Of  Phlegm  Lately          She  Is  Awake  And  Alert  And  Oriented

## 2016-12-18 NOTE — ED Provider Notes (Signed)
CSN: [id number]     Arrival date & time 12/18/16  1004 History   First MD Initiated Contact with Patient 12/18/16 1058     Chief Complaint  Patient presents with  . Cough   (Consider location/radiation/quality/duration/timing/severity/associated sxs/prior Treatment) Patient c/o sob, wheezing, and uri sx/s for a week.  She has hx of asthma.  She is out of her rescue inhaler.   The history is provided by the patient.  Cough  Cough characteristics:  Productive Sputum characteristics:  Yellow Severity:  Moderate Onset quality:  Sudden Duration:  1 week Timing:  Constant Progression:  Worsening Chronicity:  New Smoker: no   Relieved by:  Nothing Worsened by:  Nothing Ineffective treatments:  None tried   Past Medical History:  Diagnosis Date  . Asthma   . Bronchitis   . Miscarriage    Past Surgical History:  Procedure Laterality Date  . NO PAST SURGERIES     Family History  Problem Relation Age of Onset  . Hypertension Mother   . Diabetes Mother   . Asthma Brother    Social History  Substance Use Topics  . Smoking status: Former Smoker    Packs/day: 0.20    Years: 10.00    Quit date: 02/04/2013  . Smokeless tobacco: Not on file     Comment: 3-6 cigs a day/03/02/13  . Alcohol use Yes     Comment: social   OB History    Gravida Para Term Preterm AB Living   6 4 4   1 4    SAB TAB Ectopic Multiple Live Births   1             Review of Systems  Constitutional: Positive for fatigue.  HENT: Positive for sneezing.   Eyes: Negative.   Respiratory: Positive for cough.   Cardiovascular: Negative.   Gastrointestinal: Negative.   Endocrine: Negative.   Genitourinary: Negative.   Musculoskeletal: Negative.   Allergic/Immunologic: Negative.   Neurological: Negative.   Hematological: Negative.   Psychiatric/Behavioral: Negative.     Allergies  Patient has no known allergies.  Home Medications   Prior to Admission medications   Medication Sig Start Date End  Date Taking? Authorizing Provider  albuterol (PROVENTIL HFA;VENTOLIN HFA) 108 (90 Base) MCG/ACT inhaler Inhale 1-2 puffs into the lungs every 6 (six) hours as needed for wheezing or shortness of breath. 10/28/16   Lysbeth Penner, FNP  albuterol (PROVENTIL HFA;VENTOLIN HFA) 108 (90 Base) MCG/ACT inhaler Inhale 1-2 puffs into the lungs every 6 (six) hours as needed for wheezing or shortness of breath. 12/18/16   Lysbeth Penner, FNP  albuterol (PROVENTIL) (2.5 MG/3ML) 0.083% nebulizer solution Take 3 mLs (2.5 mg total) by nebulization every 4 (four) hours as needed for wheezing or shortness of breath. 10/28/16   Lysbeth Penner, FNP  azithromycin (ZITHROMAX) 250 MG tablet Take 1 tablet (250 mg total) by mouth daily. Take first 2 tablets together, then 1 every day until finished. 12/18/16   Lysbeth Penner, FNP  benzonatate (TESSALON) 100 MG capsule Take 2 capsules (200 mg total) by mouth 3 (three) times daily as needed for cough. 12/18/16   Lysbeth Penner, FNP  methylPREDNISolone (MEDROL DOSEPAK) 4 MG TBPK tablet Take 6-5-4-3-2-1 po qd 12/18/16   Lysbeth Penner, FNP  ondansetron (ZOFRAN) 8 MG tablet Take 1 tablet (8 mg total) by mouth every 8 (eight) hours as needed for nausea or vomiting. 11/15/15   Daleen Bo, MD  oseltamivir (TAMIFLU) 75 MG capsule  Take 1 capsule (75 mg total) by mouth 2 (two) times daily. 11/15/15   Daleen Bo, MD  predniSONE (DELTASONE) 20 MG tablet Take 1 tablet (20 mg total) by mouth 2 (two) times daily. 11/15/15   Daleen Bo, MD  predniSONE (STERAPRED UNI-PAK 21 TAB) 10 MG (21) TBPK tablet Take 1 tablet (10 mg total) by mouth daily. Take 6-5-4-3-2-1 po qd 10/28/16   Lysbeth Penner, FNP   Meds Ordered and Administered this Visit  Medications - No data to display  BP 130/60 (BP Location: Right Arm)   Pulse 72   Temp 98.6 F (37 C) (Oral)   Resp 18   LMP 12/18/2016   SpO2 100%  No data found.   Physical Exam  Constitutional: She is oriented to person, place, and  time. She appears well-developed and well-nourished.  HENT:  Head: Normocephalic and atraumatic.  Right Ear: External ear normal.  Left Ear: External ear normal.  Mouth/Throat: Oropharynx is clear and moist.  Eyes: Conjunctivae and EOM are normal. Pupils are equal, round, and reactive to light.  Neck: Normal range of motion.  Cardiovascular: Normal rate, regular rhythm and normal heart sounds.   Pulmonary/Chest: Effort normal and breath sounds normal.  Abdominal: Soft. Bowel sounds are normal.  Musculoskeletal: Normal range of motion.  Neurological: She is alert and oriented to person, place, and time.  Nursing note and vitals reviewed.   Urgent Care Course     Procedures (including critical care time)  Labs Review Labs Reviewed - No data to display  Imaging Review No results found.   Visual Acuity Review  Right Eye Distance:   Left Eye Distance:   Bilateral Distance:    Right Eye Near:   Left Eye Near:    Bilateral Near:         MDM   1. Cough   2. Acute bronchitis due to other specified organisms    zpak Medrol dose pack Albuterol MDI Tessalon  Push po fluids, rest, tylenol and motrin otc prn as directed for fever, arthralgias, and myalgias.  Follow up prn if sx's continue or persist.    Lysbeth Penner, FNP 12/18/16 1134

## 2017-01-03 ENCOUNTER — Ambulatory Visit (INDEPENDENT_AMBULATORY_CARE_PROVIDER_SITE_OTHER): Payer: Medicaid Other | Admitting: Obstetrics

## 2017-01-03 ENCOUNTER — Other Ambulatory Visit (HOSPITAL_COMMUNITY)
Admission: RE | Admit: 2017-01-03 | Discharge: 2017-01-03 | Disposition: A | Discharge status: Home / Self Care | Payer: Medicaid Other | Source: Ambulatory Visit | Attending: Obstetrics | Admitting: Obstetrics

## 2017-01-03 ENCOUNTER — Encounter: Payer: Self-pay | Admitting: Obstetrics

## 2017-01-03 VITALS — BP 145/92 | HR 94 | Ht 65.0 in | Wt 181.0 lb

## 2017-01-03 DIAGNOSIS — N898 Other specified noninflammatory disorders of vagina: Secondary | ICD-10-CM | POA: Diagnosis present

## 2017-01-03 DIAGNOSIS — B373 Candidiasis of vulva and vagina: Secondary | ICD-10-CM | POA: Insufficient documentation

## 2017-01-03 DIAGNOSIS — B9689 Other specified bacterial agents as the cause of diseases classified elsewhere: Secondary | ICD-10-CM | POA: Insufficient documentation

## 2017-01-03 DIAGNOSIS — N760 Acute vaginitis: Secondary | ICD-10-CM | POA: Diagnosis not present

## 2017-01-03 DIAGNOSIS — L309 Dermatitis, unspecified: Secondary | ICD-10-CM

## 2017-01-03 DIAGNOSIS — Z0001 Encounter for general adult medical examination with abnormal findings: Secondary | ICD-10-CM

## 2017-01-03 DIAGNOSIS — J4520 Mild intermittent asthma, uncomplicated: Secondary | ICD-10-CM

## 2017-01-03 DIAGNOSIS — Z0000 Encounter for general adult medical examination without abnormal findings: Secondary | ICD-10-CM | POA: Diagnosis not present

## 2017-01-03 DIAGNOSIS — Z01419 Encounter for gynecological examination (general) (routine) without abnormal findings: Secondary | ICD-10-CM | POA: Diagnosis not present

## 2017-01-03 MED ORDER — PREDNISONE 10 MG (21) PO TBPK: ORAL_TABLET | ORAL | 0 refills | Status: DC

## 2017-01-03 MED ORDER — ALBUTEROL SULFATE HFA 108 (90 BASE) MCG/ACT IN AERS: 1.0000 | INHALATION_SPRAY | Freq: Four times a day (QID) | RESPIRATORY_TRACT | 99 refills | Status: DC | PRN

## 2017-01-03 MED ORDER — PRENATAL PLUS 27-1 MG PO TABS: 1.0000 | ORAL_TABLET | Freq: Every day | ORAL | 11 refills | Status: DC

## 2017-01-03 NOTE — Progress Notes (Signed)
Subjective:        Taylor Wilkinson is a 41 y.o. female here for a routine exam.  Current complaints: Generalized itching of skin since this past weekend.  Denies any contact with new garments or other materials.   Personal health questionnaire:  Is patient Ashkenazi Jewish, have a family history of breast and/or ovarian cancer: no Is there a family history of uterine cancer diagnosed at age < 80, gastrointestinal cancer, urinary tract cancer, family member who is a Field seismologist syndrome-associated carrier: no Is the patient overweight and hypertensive, family history of diabetes, personal history of gestational diabetes, preeclampsia or PCOS: no Is patient over 57, have PCOS,  family history of premature CHD under age 84, diabetes, smoke, have hypertension or peripheral artery disease:  no At any time, has a partner hit, kicked or otherwise hurt or frightened you?: no Over the past 2 weeks, have you felt down, depressed or hopeless?: no Over the past 2 weeks, have you felt little interest or pleasure in doing things?:no   Gynecologic History Patient's last menstrual period was 12/18/2016. Contraception: none Last Pap: 2017. Results were: normal Last mammogram: 2017. Results were: normal  Obstetric History OB History  Gravida Para Term Preterm AB Living  6 4 4   1 4   SAB TAB Ectopic Multiple Live Births  1            # Outcome Date GA Lbr Len/2nd Weight Sex Delivery Anes PTL Lv  6 SAB           5 Term           4 Term           3 Term           2 Term           1 Gravida              Birth Comments: System Generated. Please review and update pregnancy details.      Past Medical History:  Diagnosis Date  . Asthma   . Bronchitis   . Miscarriage     Past Surgical History:  Procedure Laterality Date  . NO PAST SURGERIES       Current Outpatient Prescriptions:  .  albuterol (PROVENTIL HFA;VENTOLIN HFA) 108 (90 Base) MCG/ACT inhaler, Inhale 1-2 puffs into the lungs every  6 (six) hours as needed for wheezing or shortness of breath., Disp: 1 Inhaler, Rfl: prn .  benzonatate (TESSALON) 100 MG capsule, Take 2 capsules (200 mg total) by mouth 3 (three) times daily as needed for cough., Disp: 30 capsule, Rfl: 0 .  predniSONE (DELTASONE) 20 MG tablet, Take 1 tablet (20 mg total) by mouth 2 (two) times daily., Disp: 10 tablet, Rfl: 0 .  methylPREDNISolone (MEDROL DOSEPAK) 4 MG TBPK tablet, Take 6-5-4-3-2-1 po qd, Disp: 21 tablet, Rfl: 0 .  predniSONE (STERAPRED UNI-PAK 21 TAB) 10 MG (21) TBPK tablet, Take 1 tablet (10 mg total) by mouth daily. Take 6-5-4-3-2-1 po qd (Patient not taking: Reported on 01/03/2017), Disp: 21 tablet, Rfl: 0 .  predniSONE (STERAPRED UNI-PAK 21 TAB) 10 MG (21) TBPK tablet, Take as directed., Disp: 21 tablet, Rfl: 0 .  prenatal vitamin w/FE, FA (PRENATAL 1 + 1) 27-1 MG TABS tablet, Take 1 tablet by mouth daily before breakfast., Disp: 30 each, Rfl: 11 No Known Allergies  Social History  Substance Use Topics  . Smoking status: Current Some Day Smoker    Packs/day: 0.20  Years: 10.00    Types: Cigars    Last attempt to quit: 02/04/2013  . Smokeless tobacco: Never Used     Comment: 3-6 cigs a day/03/02/13 , black and milds  . Alcohol use Yes     Comment: social    Family History  Problem Relation Age of Onset  . Hypertension Mother   . Diabetes Mother   . Asthma Brother       Review of Systems  Constitutional: negative for fatigue and weight loss Respiratory: negative for cough and wheezing Cardiovascular: negative for chest pain, fatigue and palpitations Gastrointestinal: negative for abdominal pain and change in bowel habits Musculoskeletal:negative for myalgias Neurological: negative for gait problems and tremors Behavioral/Psych: negative for abusive relationship, depression Endocrine: negative for temperature intolerance    Genitourinary:negative for abnormal menstrual periods, genital lesions, hot flashes, sexual problems and  vaginal discharge Integument/breast: negative for breast lump, breast tenderness, nipple discharge and skin lesion(s)    Objective:       BP (!) 145/92   Pulse 94   Ht 5\' 5"  (1.651 m)   Wt 181 lb (82.1 kg)   LMP 12/18/2016   BMI 30.12 kg/m  General:   alert  Skin:   rash on back, arms and groin areas  Lungs:   clear to auscultation bilaterally  Heart:   regular rate and rhythm, S1, S2 normal, no murmur, click, rub or gallop  Breasts:   normal without suspicious masses, skin or nipple changes or axillary nodes  Abdomen:  normal findings: no organomegaly, soft, non-tender and no hernia  Pelvis:  External genitalia: normal general appearance Urinary system: urethral meatus normal and bladder without fullness, nontender Vaginal: normal without tenderness, induration or masses Cervix: normal appearance Adnexa: normal bimanual exam Uterus: anteverted and non-tender, normal size   Lab Review Urine pregnancy test Labs reviewed yes Radiologic studies reviewed yes  50% of 20 min visit spent on counseling and coordination of care.    Assessment:    Healthy female exam.    Atopic Dermatitis   Plan:   Prednisone Dose Pak Rx  Education reviewed: calcium supplements, depression evaluation, low fat, low cholesterol diet, safe sex/STD prevention, self breast exams and weight bearing exercise. Contraception: abstinence. Follow up in: 2 weeks.  Referred to Internal Medicine for Routine Adult Health Maintenance  Meds ordered this encounter  Medications  . predniSONE (STERAPRED UNI-PAK 21 TAB) 10 MG (21) TBPK tablet    Sig: Take as directed.    Dispense:  21 tablet    Refill:  0  . albuterol (PROVENTIL HFA;VENTOLIN HFA) 108 (90 Base) MCG/ACT inhaler    Sig: Inhale 1-2 puffs into the lungs every 6 (six) hours as needed for wheezing or shortness of breath.    Dispense:  1 Inhaler    Refill:  prn  . prenatal vitamin w/FE, FA (PRENATAL 1 + 1) 27-1 MG TABS tablet    Sig: Take 1 tablet  by mouth daily before breakfast.    Dispense:  30 each    Refill:  11   Orders Placed This Encounter  Procedures  . Ambulatory referral to Internal Medicine    Referral Priority:   Routine    Referral Type:   Consultation    Referral Reason:   Specialty Services Required    Requested Specialty:   Internal Medicine    Number of Visits Requested:   1    Patient ID: Taylor Wilkinson, female   DOB: 07-22-1976, 41 y.o.  MRN: [id number]

## 2017-01-04 LAB — CERVICOVAGINAL ANCILLARY ONLY
Bacterial vaginitis: POSITIVE — AB
CANDIDA VAGINITIS: POSITIVE — AB
Chlamydia: NEGATIVE
Neisseria Gonorrhea: NEGATIVE
Trichomonas: NEGATIVE

## 2017-01-05 ENCOUNTER — Other Ambulatory Visit: Payer: Self-pay | Admitting: Obstetrics

## 2017-01-05 DIAGNOSIS — B9689 Other specified bacterial agents as the cause of diseases classified elsewhere: Secondary | ICD-10-CM

## 2017-01-05 DIAGNOSIS — B3731 Acute candidiasis of vulva and vagina: Secondary | ICD-10-CM

## 2017-01-05 DIAGNOSIS — N760 Acute vaginitis: Principal | ICD-10-CM

## 2017-01-05 DIAGNOSIS — B373 Candidiasis of vulva and vagina: Secondary | ICD-10-CM

## 2017-01-05 MED ORDER — FLUCONAZOLE 150 MG PO TABS: 150.0000 mg | ORAL_TABLET | Freq: Once | ORAL | 0 refills | Status: DC

## 2017-01-05 MED ORDER — METRONIDAZOLE 500 MG PO TABS: 500.0000 mg | ORAL_TABLET | Freq: Two times a day (BID) | ORAL | 2 refills | Status: DC

## 2017-01-07 ENCOUNTER — Telehealth: Payer: Self-pay | Admitting: *Deleted

## 2017-01-07 NOTE — Telephone Encounter (Signed)
Pt called in to return your call, requested a call back.Taylor KitchenMarland Wilkinson

## 2017-01-08 LAB — CYTOLOGY - PAP
DIAGNOSIS: UNDETERMINED — AB
HPV (WINDOPATH): NOT DETECTED

## 2017-01-17 ENCOUNTER — Ambulatory Visit: Payer: Medicaid Other | Admitting: Obstetrics

## 2017-01-21 ENCOUNTER — Telehealth: Payer: Self-pay | Admitting: *Deleted

## 2017-01-21 NOTE — Telephone Encounter (Signed)
Pt called to office stating that she would like to know if Dr Jodi Mourning could send in a Rx for Prednisone.  Pt states that she has been using her "breathing machine" a lot more recently due to seasonal allergies. Pt states that she has not been sleeping well due to using machine. Pt would like to know if she could get taper pack of Prednisone as it may help. Pt states that she does not have PCP, usually sees Dr Jodi Mourning. Pt has follow up appt later this week.    Please advise on Rx.

## 2017-01-22 ENCOUNTER — Other Ambulatory Visit: Payer: Self-pay | Admitting: Obstetrics

## 2017-01-22 DIAGNOSIS — L309 Dermatitis, unspecified: Secondary | ICD-10-CM

## 2017-01-22 MED ORDER — PREDNISONE 10 MG (21) PO TBPK: ORAL_TABLET | ORAL | 0 refills | Status: DC

## 2017-01-22 NOTE — Telephone Encounter (Signed)
Left message making pt aware of Rx and need to keep appt scheduled for later this week.

## 2017-01-22 NOTE — Telephone Encounter (Signed)
Prednisone taper Rx

## 2017-01-24 ENCOUNTER — Ambulatory Visit (INDEPENDENT_AMBULATORY_CARE_PROVIDER_SITE_OTHER): Payer: Medicaid Other | Admitting: Obstetrics

## 2017-01-24 ENCOUNTER — Encounter: Payer: Self-pay | Admitting: Obstetrics

## 2017-01-24 VITALS — BP 111/76 | HR 91 | Ht 65.0 in | Wt 179.6 lb

## 2017-01-24 DIAGNOSIS — J301 Allergic rhinitis due to pollen: Secondary | ICD-10-CM | POA: Diagnosis not present

## 2017-01-24 DIAGNOSIS — L209 Atopic dermatitis, unspecified: Secondary | ICD-10-CM

## 2017-01-24 MED ORDER — LORATADINE 10 MG PO TABS: 10.0000 mg | ORAL_TABLET | Freq: Every day | ORAL | 11 refills | Status: DC

## 2017-01-24 NOTE — Progress Notes (Signed)
Patient ID: Taylor Wilkinson, female   DOB: 1976/07/13, 41 y.o.   MRN: [id number]  Chief Complaint  Patient presents with  . Follow-up    HPI Taylor Wilkinson is a 41 y.o. female.  History of itching on back, arms and groin areas.  Took Prednisone DS Dose Pak.  Itching has resolved. HPI  Past Medical History:  Diagnosis Date  . Asthma   . Bronchitis   . Miscarriage     Past Surgical History:  Procedure Laterality Date  . NO PAST SURGERIES      Family History  Problem Relation Age of Onset  . Hypertension Mother   . Diabetes Mother   . Asthma Brother     Social History Social History  Substance Use Topics  . Smoking status: Current Some Day Smoker    Packs/day: 0.20    Years: 10.00    Types: Cigars    Last attempt to quit: 02/04/2013  . Smokeless tobacco: Never Used     Comment: 3-6 cigs a day/03/02/13 , black and milds  . Alcohol use Yes     Comment: social    No Known Allergies  Current Outpatient Prescriptions  Medication Sig Dispense Refill  . albuterol (PROVENTIL HFA;VENTOLIN HFA) 108 (90 Base) MCG/ACT inhaler Inhale 1-2 puffs into the lungs every 6 (six) hours as needed for wheezing or shortness of breath. 1 Inhaler prn  . methylPREDNISolone (MEDROL DOSEPAK) 4 MG TBPK tablet Take 6-5-4-3-2-1 po qd 21 tablet 0  . prenatal vitamin w/FE, FA (PRENATAL 1 + 1) 27-1 MG TABS tablet Take 1 tablet by mouth daily before breakfast. 30 each 11  . loratadine (CLARITIN) 10 MG tablet Take 1 tablet (10 mg total) by mouth daily. 30 tablet 11   No current facility-administered medications for this visit.     Review of Systems Review of Systems Constitutional: negative for fatigue and weight loss Respiratory: negative for cough and wheezing Cardiovascular: negative for chest pain, fatigue and palpitations Gastrointestinal: negative for abdominal pain and change in bowel habits Genitourinary:negative Integument/breast: negative for nipple  discharge Musculoskeletal:negative for myalgias Neurological: negative for gait problems and tremors Behavioral/Psych: negative for abusive relationship, depression Endocrine: negative for temperature intolerance      Blood pressure 111/76, pulse 91, height 5\' 5"  (1.651 m), weight 179 lb 9.6 oz (81.5 kg), last menstrual period 01/15/2017.  Physical Exam Physical Exam General:   alert  Skin:   no rash or abnormalities  Lungs:   clear to auscultation bilaterally  Heart:   regular rate and rhythm, S1, S2 normal, no murmur, click, rub or gallop  Breasts:   normal without suspicious masses, skin or nipple changes or axillary nodes  Abdomen:  normal findings: no organomegaly, soft, non-tender and no hernia  Pelvis:  External genitalia: normal general appearance Urinary system: urethral meatus normal and bladder without fullness, nontender Vaginal: normal without tenderness, induration or masses Cervix: normal appearance Adnexa: normal bimanual exam Uterus: anteverted and non-tender, normal size    >50% of 10 min visit spent on counseling and coordination of care.    Data Reviewed None  Assessment     Atopic Dermatitis Seasonal Allergic Rhinitis    Plan    Prednisone DS Dose Pak Rx.  Resolved. Claritin Rx for allergies F/U prn  No orders of the defined types were placed in this encounter.  Meds ordered this encounter  Medications  . loratadine (CLARITIN) 10 MG tablet    Sig: Take 1 tablet (10 mg total) by  mouth daily.    Dispense:  30 tablet    Refill:  11

## 2017-01-28 ENCOUNTER — Emergency Department (HOSPITAL_COMMUNITY)
Admission: EM | Admit: 2017-01-28 | Discharge: 2017-01-28 | Disposition: A | Discharge status: Home / Self Care | Payer: Medicaid Other | Attending: Emergency Medicine | Admitting: Emergency Medicine

## 2017-01-28 ENCOUNTER — Encounter (HOSPITAL_COMMUNITY): Payer: Self-pay | Admitting: Emergency Medicine

## 2017-01-28 ENCOUNTER — Emergency Department (HOSPITAL_COMMUNITY): Payer: Medicaid Other

## 2017-01-28 DIAGNOSIS — F17290 Nicotine dependence, other tobacco product, uncomplicated: Secondary | ICD-10-CM | POA: Insufficient documentation

## 2017-01-28 DIAGNOSIS — R0602 Shortness of breath: Secondary | ICD-10-CM | POA: Diagnosis present

## 2017-01-28 DIAGNOSIS — J45909 Unspecified asthma, uncomplicated: Secondary | ICD-10-CM | POA: Insufficient documentation

## 2017-01-28 DIAGNOSIS — Z79899 Other long term (current) drug therapy: Secondary | ICD-10-CM | POA: Diagnosis not present

## 2017-01-28 DIAGNOSIS — J4541 Moderate persistent asthma with (acute) exacerbation: Secondary | ICD-10-CM | POA: Diagnosis not present

## 2017-01-28 MED ORDER — PREDNISONE 20 MG PO TABS: 40.0000 mg | ORAL_TABLET | Freq: Every day | ORAL | 0 refills | Status: DC

## 2017-01-28 MED ORDER — ALBUTEROL SULFATE (2.5 MG/3ML) 0.083% IN NEBU: 2.5000 mg | INHALATION_SOLUTION | Freq: Four times a day (QID) | RESPIRATORY_TRACT | 12 refills | Status: DC | PRN

## 2017-01-28 MED ORDER — IPRATROPIUM BROMIDE 0.02 % IN SOLN
0.5000 mg | Freq: Once | RESPIRATORY_TRACT | Status: AC
  Administered 2017-01-28: 0.5 mg via RESPIRATORY_TRACT
  Filled 2017-01-28: qty 2.5

## 2017-01-28 MED ORDER — ALBUTEROL SULFATE HFA 108 (90 BASE) MCG/ACT IN AERS
2.0000 | INHALATION_SPRAY | RESPIRATORY_TRACT | Status: DC | PRN
  Administered 2017-01-28: 2 via RESPIRATORY_TRACT
  Filled 2017-01-28: qty 6.7

## 2017-01-28 MED ORDER — PREDNISONE 20 MG PO TABS
60.0000 mg | ORAL_TABLET | Freq: Once | ORAL | Status: AC
Start: 2017-01-28 — End: 2017-01-28
  Administered 2017-01-28: 60 mg via ORAL
  Filled 2017-01-28: qty 3

## 2017-01-28 MED ORDER — ALBUTEROL SULFATE (2.5 MG/3ML) 0.083% IN NEBU
5.0000 mg | INHALATION_SOLUTION | Freq: Once | RESPIRATORY_TRACT | Status: AC
  Administered 2017-01-28: 5 mg via RESPIRATORY_TRACT
  Filled 2017-01-28: qty 6

## 2017-01-28 NOTE — ED Triage Notes (Signed)
Pt reports placed on prednisone by her PMD 1 week ago. Pt reports she stopped taking the prednisone because she thought it was making her feel worse. Pt thought shortness of breath was due to the pollen. Pt has tried inhaler without relief.

## 2017-01-28 NOTE — ED Notes (Signed)
Patient transported to X-ray 

## 2017-01-28 NOTE — ED Provider Notes (Signed)
[facility] DEPT Provider Note   CSN: [id number] Arrival date & time: 01/28/17  6222     History   Chief Complaint Chief Complaint  Patient presents with  . Shortness of Breath    HPI Taylor Wilkinson is a 41 y.o. female.  Patient is a 41 year old female with a history of asthma and bronchitis presenting today with shortness of breath. Patient states she saw her doctor last week and at that time they felt she needed treatment for acute bronchitis. She was given prednisone and to continue using her inhalers. She took the prednisone for 2 days which made her feel better but she did 1 to continue taking it. Then 2 days ago her shortness of breath started to get worse.  She took another dose of steroids but felt like she wasn't getting any better. She had also received a prescription for Claritin but because she was trying to feel worse and her inhaler was not working she did not want to start taking more medications and came here for further evaluation. In the last 24 hours she's had extreme chest tightness, shortness of breath and cough with yellow mucus production. She denies any fevers. She has been using her inhaler every 2 hours with some improvement but then symptoms quickly returned. She tried to use her nebulizer but realized that the hand-held portion was cracked and she was not getting the medication. She also ran out of nebulized solution. The leg swelling or cardiac history. Only chest tightness related to her breathing that resolves with her inhaler.   The history is provided by the patient.  Shortness of Breath  This is a recurrent problem. The average episode lasts 1 day. The problem occurs continuously.The current episode started yesterday. The problem has been gradually worsening. Associated symptoms include rhinorrhea, sore throat, cough, sputum production and wheezing. Pertinent negatives include no fever, no syncope, no vomiting, no abdominal pain, no leg pain and no leg  swelling. The problem's precipitants include pollens. Risk factors include smoking. She has tried oral steroids and beta-agonist inhalers for the symptoms. The treatment provided no relief. She has had prior ED visits. She has had no prior ICU admissions. Associated medical issues include asthma.    Past Medical History:  Diagnosis Date  . Asthma   . Bronchitis   . Miscarriage     Patient Active Problem List   Diagnosis Date Noted  . CAP (community acquired pneumonia) 01/04/2013  . Asthma exacerbation 01/04/2013  . Tachycardia 01/04/2013    Past Surgical History:  Procedure Laterality Date  . NO PAST SURGERIES      OB History    Gravida Para Term Preterm AB Living   6 4 4   1 4    SAB TAB Ectopic Multiple Live Births   1               Home Medications    Prior to Admission medications   Medication Sig Start Date End Date Taking? Authorizing Provider  albuterol (PROVENTIL HFA;VENTOLIN HFA) 108 (90 Base) MCG/ACT inhaler Inhale 1-2 puffs into the lungs every 6 (six) hours as needed for wheezing or shortness of breath. 01/03/17   [name], MD  loratadine (CLARITIN) 10 MG tablet Take 1 tablet (10 mg total) by mouth daily. 01/24/17   [name], MD  methylPREDNISolone (MEDROL DOSEPAK) 4 MG TBPK tablet Take 6-5-4-3-2-1 po qd 12/18/16   Lysbeth Penner, FNP  prenatal vitamin w/FE, FA (PRENATAL 1 + 1) 27-1 MG  TABS tablet Take 1 tablet by mouth daily before breakfast. 01/03/17   [name], MD    Family History Family History  Problem Relation Age of Onset  . Hypertension Mother   . Diabetes Mother   . Asthma Brother     Social History Social History  Substance Use Topics  . Smoking status: Current Some Day Smoker    Packs/day: 0.20    Years: 10.00    Types: Cigars    Last attempt to quit: 02/04/2013  . Smokeless tobacco: Never Used     Comment: 3-6 cigs a day/03/02/13 , black and milds  . Alcohol use Yes     Comment: social     Allergies     Patient has no known allergies.   Review of Systems Review of Systems  Constitutional: Negative for fever.  HENT: Positive for rhinorrhea and sore throat.   Respiratory: Positive for cough, sputum production, shortness of breath and wheezing.   Cardiovascular: Negative for leg swelling and syncope.  Gastrointestinal: Negative for abdominal pain and vomiting.  All other systems reviewed and are negative.    Physical Exam Updated Vital Signs BP 111/89   Pulse 84   Temp 98 F (36.7 C) (Oral)   Resp 16   LMP 01/15/2017   SpO2 98%   Physical Exam  Constitutional: She is oriented to person, place, and time. She appears well-developed and well-nourished. No distress.  HENT:  Head: Normocephalic and atraumatic.  Right Ear: Tympanic membrane normal.  Left Ear: Tympanic membrane normal.  Nose: Mucosal edema present.  Mouth/Throat: Oropharynx is clear and moist.  Eyes: Conjunctivae and EOM are normal. Pupils are equal, round, and reactive to light.  Neck: Normal range of motion. Neck supple.  Cardiovascular: Normal rate, regular rhythm and intact distal pulses.   No murmur heard. Pulmonary/Chest: Effort normal. No respiratory distress. She has wheezes. She has no rales.  Abdominal: Soft. She exhibits no distension. There is no tenderness. There is no rebound and no guarding.  Musculoskeletal: Normal range of motion. She exhibits no edema or tenderness.  Neurological: She is alert and oriented to person, place, and time.  Skin: Skin is warm and dry. No rash noted. No erythema.  Psychiatric: She has a normal mood and affect. Her behavior is normal.  Nursing note and vitals reviewed.    ED Treatments / Results  Labs (all labs ordered are listed, but only abnormal results are displayed) Labs Reviewed - No data to display  EKG  EKG Interpretation None       Radiology Dg Chest 2 View  Result Date: 01/28/2017 CLINICAL DATA:  Bronchitis.  Cough and congestion. EXAM: CHEST   2 VIEW COMPARISON:  11/15/2015, 05/27/2014, 01/03/2013. FINDINGS: Mediastinum and hilar structures normal. Very mild lingular subsegmental atelectasis, lungs are otherwise clear Heart size normal. No pleural effusion or pneumothorax. IMPRESSION: Very mild lingular subsegmental atelectasis. Chest is otherwise negative . Electronically Signed   By: Marcello Moores  Register   On: 01/28/2017 07:21    Procedures Procedures (including critical care time)  Medications Ordered in ED Medications  albuterol (PROVENTIL) (2.5 MG/3ML) 0.083% nebulizer solution 5 mg (0 mg Nebulization Hold 01/28/17 0728)  albuterol (PROVENTIL) (2.5 MG/3ML) 0.083% nebulizer solution 5 mg (5 mg Nebulization Given 01/28/17 0725)  ipratropium (ATROVENT) nebulizer solution 0.5 mg (0.5 mg Nebulization Given 01/28/17 0727)  predniSONE (DELTASONE) tablet 60 mg (60 mg Oral Given 01/28/17 0728)     Initial Impression / Assessment and Plan / ED Course  I have reviewed the triage vital signs and the nursing notes.  Pertinent labs & imaging results that were available during my care of the patient were reviewed by me and considered in my medical decision making (see chart for details).     Pt with typical asthma exacerbation  symptoms.  No infectious sx or other complaints.  Low suspicion for PE or ACS.  Wheezing on exam.  will give steroids, albuterol/atrovent and recheck.  CXR with mild atelectasis but o/w wnl.   Final Clinical Impressions(s) / ED Diagnoses   Final diagnoses:  Moderate persistent asthma with exacerbation    New Prescriptions New Prescriptions   ALBUTEROL (PROVENTIL) (2.5 MG/3ML) 0.083% NEBULIZER SOLUTION    Take 3 mLs (2.5 mg total) by nebulization every 6 (six) hours as needed for wheezing or shortness of breath.   PREDNISONE (DELTASONE) 20 MG TABLET    Take 2 tablets (40 mg total) by mouth daily.     Blanchie Dessert, MD 01/28/17 [phone]

## 2017-04-23 ENCOUNTER — Other Ambulatory Visit: Payer: Self-pay | Admitting: Obstetrics

## 2017-04-23 DIAGNOSIS — Z1231 Encounter for screening mammogram for malignant neoplasm of breast: Secondary | ICD-10-CM

## 2017-05-01 ENCOUNTER — Ambulatory Visit
Admission: RE | Admit: 2017-05-01 | Discharge: 2017-05-01 | Disposition: A | Discharge status: Home / Self Care | Payer: Medicaid Other | Source: Ambulatory Visit | Attending: Obstetrics | Admitting: Obstetrics

## 2017-05-01 DIAGNOSIS — Z1231 Encounter for screening mammogram for malignant neoplasm of breast: Secondary | ICD-10-CM

## 2017-05-07 ENCOUNTER — Other Ambulatory Visit: Payer: Self-pay | Admitting: Obstetrics

## 2017-05-07 DIAGNOSIS — L309 Dermatitis, unspecified: Secondary | ICD-10-CM

## 2017-05-22 DIAGNOSIS — Z23 Encounter for immunization: Secondary | ICD-10-CM | POA: Diagnosis not present

## 2017-05-22 DIAGNOSIS — L2389 Allergic contact dermatitis due to other agents: Secondary | ICD-10-CM | POA: Diagnosis not present

## 2017-06-07 ENCOUNTER — Ambulatory Visit (HOSPITAL_COMMUNITY)
Admission: EM | Admit: 2017-06-07 | Discharge: 2017-06-07 | Disposition: A | Discharge status: Home / Self Care | Payer: Medicaid Other | Attending: Internal Medicine | Admitting: Internal Medicine

## 2017-06-07 ENCOUNTER — Encounter (HOSPITAL_COMMUNITY): Payer: Self-pay | Admitting: *Deleted

## 2017-06-07 DIAGNOSIS — R062 Wheezing: Secondary | ICD-10-CM

## 2017-06-07 DIAGNOSIS — R0602 Shortness of breath: Secondary | ICD-10-CM

## 2017-06-07 DIAGNOSIS — J4541 Moderate persistent asthma with (acute) exacerbation: Secondary | ICD-10-CM

## 2017-06-07 MED ORDER — IPRATROPIUM-ALBUTEROL 0.5-2.5 (3) MG/3ML IN SOLN
3.0000 mL | Freq: Once | RESPIRATORY_TRACT | Status: AC
  Administered 2017-06-07: 3 mL via RESPIRATORY_TRACT

## 2017-06-07 MED ORDER — IPRATROPIUM-ALBUTEROL 0.5-2.5 (3) MG/3ML IN SOLN
RESPIRATORY_TRACT | Status: AC
  Filled 2017-06-07: qty 3

## 2017-06-07 MED ORDER — PREDNISONE 10 MG (21) PO TBPK: ORAL_TABLET | Freq: Every day | ORAL | 0 refills | Status: DC

## 2017-06-07 NOTE — Discharge Instructions (Signed)
Start prednisone as directed. Continue albuterol inhaler and nebulizer as needed. Smoking cessation encouraged. Establish PCP care as soon as possible. With frequent exacerbation of asthma, you may need a maintenance medication to help control it. Monitor for worsening of symptoms, chest pain, shortness of breath, weakness, dizziness, passing out, palpitations, go to the emergency department for further evaluation.

## 2017-06-07 NOTE — ED Triage Notes (Signed)
Shortness   Of  Breath  And   Cough       With   Some   Back      Pain   Pt  Is  A   Smoker    Pt  Has   Bronchitis       Symptoms      Not  releived      By   H. J. Heinz

## 2017-06-07 NOTE — ED Provider Notes (Signed)
[facility]    CSN: [id number] Arrival date & time: 06/07/17  1109     History   Chief Complaint Chief Complaint  Patient presents with  . Cough    HPI Taylor Wilkinson is a 41 y.o. female.   41 year old female with history of asthma comes in for 3 day history of cough. She states she has been feeling like "I'm catching a cold" for the past 3 days. Been taking otc flu medications. Started feeling short of breath last night, used albuterol neb with good relief. Has continued to be short of breath. Has continued otc flu medication and inhaler with minimal relief this morning. She feels chest tightness. Experiences wheezing, shortness of breath, and back pain. Upper back pain between shoulder blades, describes as pressure/tightness, and thinks it is due to the wheezing. Denies fever, chills, night sweats. Denies chest pain. Some weakness/dizziness/lightheadedness. Denies syncope. Some left ear pain, denies eye pain. No sick contact. Current every day smoker, around 3 cigars/day.       Past Medical History:  Diagnosis Date  . Asthma   . Bronchitis   . Miscarriage     Patient Active Problem List   Diagnosis Date Noted  . CAP (community acquired pneumonia) 01/04/2013  . Asthma exacerbation 01/04/2013  . Tachycardia 01/04/2013    Past Surgical History:  Procedure Laterality Date  . NO PAST SURGERIES      OB History    Gravida Para Term Preterm AB Living   6 4 4   1 4    SAB TAB Ectopic Multiple Live Births   1               Home Medications    Prior to Admission medications   Medication Sig Start Date End Date Taking? Authorizing Provider  albuterol (PROVENTIL) (2.5 MG/3ML) 0.083% nebulizer solution Take 3 mLs (2.5 mg total) by nebulization every 6 (six) hours as needed for wheezing or shortness of breath. 01/28/17   Blanchie Dessert, MD  loratadine (CLARITIN) 10 MG tablet Take 1 tablet (10 mg total) by mouth daily. 01/24/17   [name], MD    predniSONE (STERAPRED UNI-PAK 21 TAB) 10 MG (21) TBPK tablet Take by mouth daily. Take 6 tabs by mouth day 1, then 5 tabs, then 4 tabs, then 3 tabs, 2 tabs, then 1 tab for the last day 06/07/17   Ok Edwards, PA-C  prenatal vitamin w/FE, FA (PRENATAL 1 + 1) 27-1 MG TABS tablet Take 1 tablet by mouth daily before breakfast. 01/03/17   [name], MD    Family History Family History  Problem Relation Age of Onset  . Hypertension Mother   . Diabetes Mother   . Asthma Brother     Social History Social History  Substance Use Topics  . Smoking status: Current Some Day Smoker    Packs/day: 0.20    Years: 10.00    Types: Cigars    Last attempt to quit: 02/04/2013  . Smokeless tobacco: Never Used     Comment: 3-6 cigs a day/03/02/13 , black and milds  . Alcohol use Yes     Comment: social     Allergies   Patient has no known allergies.   Review of Systems Review of Systems  Reason unable to perform ROS: See HPI as above.     Physical Exam Triage Vital Signs ED Triage Vitals  Enc Vitals Group     BP 06/07/17 1143 106/70  Pulse Rate 06/07/17 1143 96     Resp 06/07/17 1143 18     Temp 06/07/17 1143 98.3 F (36.8 C)     Temp Source 06/07/17 1143 Oral     SpO2 06/07/17 1140 100 %     Weight --      Height --      Head Circumference --      Peak Flow --      Pain Score 06/07/17 1140 4     Pain Loc --      Pain Edu? --      Excl. in [place]? --    No data found.   Updated Vital Signs BP 106/70 (BP Location: Right Arm)   Pulse 96   Temp 98.3 F (36.8 C) (Oral)   Resp 18   LMP 05/17/2017   SpO2 100%    Physical Exam  Constitutional: She is oriented to person, place, and time. She appears well-developed and well-nourished. No distress.  HENT:  Head: Normocephalic and atraumatic.  Right Ear: Tympanic membrane, external ear and ear canal normal. Tympanic membrane is not erythematous and not bulging.  Left Ear: Tympanic membrane, external ear and ear canal  normal. Tympanic membrane is not erythematous and not bulging.  Nose: Nose normal. Right sinus exhibits no maxillary sinus tenderness and no frontal sinus tenderness. Left sinus exhibits no maxillary sinus tenderness and no frontal sinus tenderness.  Mouth/Throat: Uvula is midline, oropharynx is clear and moist and mucous membranes are normal.  Eyes: Pupils are equal, round, and reactive to light. Conjunctivae are normal.  Neck: Normal range of motion. Neck supple.  Cardiovascular: Normal rate, regular rhythm and normal heart sounds.  Exam reveals no gallop and no friction rub.   No murmur heard. Pulmonary/Chest: Effort normal. She has no decreased breath sounds. She has wheezes (Inspiratory and expiratory wheezes throughout.). She has no rhonchi. She has no rales.  Lymphadenopathy:    She has no cervical adenopathy.  Neurological: She is alert and oriented to person, place, and time.  Skin: Skin is warm and dry.  Psychiatric: She has a normal mood and affect. Her behavior is normal. Judgment normal.     UC Treatments / Results  Labs (all labs ordered are listed, but only abnormal results are displayed) Labs Reviewed - No data to display  EKG  EKG Interpretation None       Radiology No results found.  Procedures Procedures (including critical care time)  Medications Ordered in UC Medications  ipratropium-albuterol (DUONEB) 0.5-2.5 (3) MG/3ML nebulizer solution 3 mL (3 mLs Nebulization Given 06/07/17 1227)     Initial Impression / Assessment and Plan / UC Course  I have reviewed the triage vital signs and the nursing notes.  Pertinent labs & imaging results that were available during my care of the patient were reviewed by me and considered in my medical decision making (see chart for details).  Clinical Course as of Jun 07 1257  Fri Jun 07, 2017  1252 Inspiratory and expiratory wheezing improved  with better air movement after DuoNeb. Patient states improved chest  tightness and back pressure.  [AY]    Clinical Course User Index [AY] Tasia Catchings, Ayaansh Smail V, PA-C    Prednisone Dosepak as directed. Continue albuterol inhaler and nebulizer as needed. Patient to establish PCP care for further management of asthma given frequent exacerbations. Return precautions given.  Final Clinical Impressions(s) / UC Diagnoses   Final diagnoses:  Moderate persistent asthma with exacerbation    New  Prescriptions New Prescriptions   PREDNISONE (STERAPRED UNI-PAK 21 TAB) 10 MG (21) TBPK TABLET    Take by mouth daily. Take 6 tabs by mouth day 1, then 5 tabs, then 4 tabs, then 3 tabs, 2 tabs, then 1 tab for the last day       Arturo Morton 06/07/17 1259

## 2017-07-24 ENCOUNTER — Encounter (HOSPITAL_COMMUNITY): Payer: Self-pay

## 2017-07-24 ENCOUNTER — Emergency Department (HOSPITAL_COMMUNITY)
Admission: EM | Admit: 2017-07-24 | Discharge: 2017-07-24 | Disposition: A | Discharge status: Home / Self Care | Payer: Medicaid Other | Attending: Emergency Medicine | Admitting: Emergency Medicine

## 2017-07-24 DIAGNOSIS — Y998 Other external cause status: Secondary | ICD-10-CM | POA: Diagnosis not present

## 2017-07-24 DIAGNOSIS — F17290 Nicotine dependence, other tobacco product, uncomplicated: Secondary | ICD-10-CM | POA: Insufficient documentation

## 2017-07-24 DIAGNOSIS — J45909 Unspecified asthma, uncomplicated: Secondary | ICD-10-CM | POA: Insufficient documentation

## 2017-07-24 DIAGNOSIS — M542 Cervicalgia: Secondary | ICD-10-CM | POA: Diagnosis not present

## 2017-07-24 DIAGNOSIS — Y92410 Unspecified street and highway as the place of occurrence of the external cause: Secondary | ICD-10-CM | POA: Insufficient documentation

## 2017-07-24 DIAGNOSIS — Z79899 Other long term (current) drug therapy: Secondary | ICD-10-CM | POA: Diagnosis not present

## 2017-07-24 DIAGNOSIS — Y939 Activity, unspecified: Secondary | ICD-10-CM | POA: Insufficient documentation

## 2017-07-24 MED ORDER — CYCLOBENZAPRINE HCL 5 MG PO TABS: 10.0000 mg | ORAL_TABLET | Freq: Every evening | ORAL | 0 refills | Status: DC | PRN

## 2017-07-24 MED ORDER — NAPROXEN 500 MG PO TABS: 500.0000 mg | ORAL_TABLET | Freq: Two times a day (BID) | ORAL | 0 refills | Status: DC
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
## 2017-09-27 ENCOUNTER — Ambulatory Visit (HOSPITAL_COMMUNITY)
Admission: EM | Admit: 2017-09-27 | Discharge: 2017-09-27 | Disposition: A | Discharge status: Home / Self Care | Payer: Medicaid Other | Attending: Emergency Medicine | Admitting: Emergency Medicine

## 2017-09-27 ENCOUNTER — Encounter (HOSPITAL_COMMUNITY): Payer: Self-pay | Admitting: Family Medicine

## 2017-09-27 DIAGNOSIS — J069 Acute upper respiratory infection, unspecified: Secondary | ICD-10-CM

## 2017-09-27 DIAGNOSIS — B9789 Other viral agents as the cause of diseases classified elsewhere: Secondary | ICD-10-CM

## 2017-09-27 MED ORDER — PREDNISONE 20 MG PO TABS: 40.0000 mg | ORAL_TABLET | Freq: Every day | ORAL | 0 refills | Status: AC

## 2017-09-27 MED ORDER — ALBUTEROL SULFATE HFA 108 (90 BASE) MCG/ACT IN AERS: 1.0000 | INHALATION_SPRAY | Freq: Four times a day (QID) | RESPIRATORY_TRACT | 0 refills | Status: DC | PRN

## 2017-09-27 MED ORDER — FLUTICASONE PROPIONATE 50 MCG/ACT NA SUSP: 1.0000 | Freq: Every day | NASAL | 2 refills | Status: DC

## 2017-09-27 NOTE — Discharge Instructions (Signed)
Push fluids to ensure adequate hydration and keep secretions thin.  Tylenol and/or ibuprofen as needed for pain or fevers.  Inhaler as needed for wheezing or shortness of breath. 5 days of steroids to help with wheezing. Daily flonase to help with sinus pressure. Continue with alka selzer cold and claritin as needed for symptoms.  If symptoms worsen or do not improve in the next week to return to be seen or to follow up with your PCP.

## 2017-09-27 NOTE — ED Provider Notes (Signed)
[facility]    CSN: [id number] Arrival date & time: 09/27/17  1002     History   Chief Complaint Chief Complaint  Patient presents with  . URI    HPI Taylor Wilkinson is a 42 y.o. female.   Taylor Wilkinson presents with complaints of productive cough, sinus pressure and nasal congestion, body aches and sore throat which started 3 days ago. Without gi/gu complaints. No known ill contacts. Has been taking alka selzer cold as well as claritin, which have helped. Ran out of her inhaler, history of asthma. Short of breath. No known fevers.   ROS per HPI.       Past Medical History:  Diagnosis Date  . Asthma   . Bronchitis   . Miscarriage     Patient Active Problem List   Diagnosis Date Noted  . CAP (community acquired pneumonia) 01/04/2013  . Asthma exacerbation 01/04/2013  . Tachycardia 01/04/2013    Past Surgical History:  Procedure Laterality Date  . NO PAST SURGERIES      OB History    Gravida Para Term Preterm AB Living   6 4 4   1 4    SAB TAB Ectopic Multiple Live Births   1               Home Medications    Prior to Admission medications   Medication Sig Start Date End Date Taking? Authorizing Provider  albuterol (PROVENTIL HFA;VENTOLIN HFA) 108 (90 Base) MCG/ACT inhaler Inhale 1-2 puffs into the lungs every 6 (six) hours as needed for wheezing or shortness of breath. 09/27/17   [name], NP  albuterol (PROVENTIL) (2.5 MG/3ML) 0.083% nebulizer solution Take 3 mLs (2.5 mg total) by nebulization every 6 (six) hours as needed for wheezing or shortness of breath. 01/28/17   Blanchie Dessert, MD  cyclobenzaprine (FLEXERIL) 5 MG tablet Take 2 tablets (10 mg total) at bedtime as needed by mouth for muscle spasms. 07/24/17   Maczis, Barth Kirks, PA-C  fluticasone (FLONASE) 50 MCG/ACT nasal spray Place 1 spray into both nostrils daily. 09/27/17   [name], NP  loratadine (CLARITIN) 10 MG tablet Take 1 tablet (10 mg total) by mouth daily.  01/24/17   [name], MD  naproxen (NAPROSYN) 500 MG tablet Take 1 tablet (500 mg total) 2 (two) times daily by mouth. 07/24/17   Maczis, Barth Kirks, PA-C  predniSONE (DELTASONE) 20 MG tablet Take 2 tablets (40 mg total) by mouth daily with breakfast for 5 days. 09/27/17 10/02/17  [name], NP  prenatal vitamin w/FE, FA (PRENATAL 1 + 1) 27-1 MG TABS tablet Take 1 tablet by mouth daily before breakfast. 01/03/17   [name], MD    Family History Family History  Problem Relation Age of Onset  . Hypertension Mother   . Diabetes Mother   . Asthma Brother     Social History Social History   Tobacco Use  . Smoking status: Current Some Day Smoker    Packs/day: 0.20    Years: 10.00    Pack years: 2.00    Types: Cigars    Last attempt to quit: 02/04/2013    Years since quitting: 4.6  . Smokeless tobacco: Never Used  . Tobacco comment: 3-6 cigs a day/03/02/13 , black and milds  Substance Use Topics  . Alcohol use: Yes    Comment: social  . Drug use: Yes    Types: Marijuana    Comment: marijuana--blunt a day--maybe 2 a  day     Allergies   Patient has no known allergies.   Review of Systems Review of Systems   Physical Exam Triage Vital Signs ED Triage Vitals  Enc Vitals Group     BP 09/27/17 1036 133/84     Pulse Rate 09/27/17 1036 98     Resp 09/27/17 1036 18     Temp 09/27/17 1036 98.7 F (37.1 C)     Temp src --      SpO2 09/27/17 1036 98 %     Weight --      Height --      Head Circumference --      Peak Flow --      Pain Score 09/27/17 1034 4     Pain Loc --      Pain Edu? --      Excl. in [place]? --    No data found.  Updated Vital Signs BP 133/84   Pulse 98   Temp 98.7 F (37.1 C)   Resp 18   LMP 09/15/2017   SpO2 98%   Visual Acuity Right Eye Distance:   Left Eye Distance:   Bilateral Distance:    Right Eye Near:   Left Eye Near:    Bilateral Near:     Physical Exam  Constitutional: She is oriented to person, place, and  time. She appears well-developed and well-nourished. No distress.  HENT:  Head: Normocephalic and atraumatic.  Right Ear: Tympanic membrane, external ear and ear canal normal.  Left Ear: Tympanic membrane, external ear and ear canal normal.  Nose: Nose normal.  Mouth/Throat: Uvula is midline, oropharynx is clear and moist and mucous membranes are normal. No tonsillar exudate.  Eyes: Conjunctivae and EOM are normal. Pupils are equal, round, and reactive to light.  Cardiovascular: Normal rate, regular rhythm and normal heart sounds.  Pulmonary/Chest: Effort normal. She has wheezes.  Faint scattered wheezing present   Neurological: She is alert and oriented to person, place, and time.  Skin: Skin is warm and dry.     UC Treatments / Results  Labs (all labs ordered are listed, but only abnormal results are displayed) Labs Reviewed - No data to display  EKG  EKG Interpretation None       Radiology No results found.  Procedures Procedures (including critical care time)  Medications Ordered in UC Medications - No data to display   Initial Impression / Assessment and Plan / UC Course  I have reviewed the triage vital signs and the nursing notes.  Pertinent labs & imaging results that were available during my care of the patient were reviewed by me and considered in my medical decision making (see chart for details).     flonase for nasal congestion and pressure; wheezing present, 5 days of 40mg  prednisone provided as feels mildly short of breath. Albuterol as needed. Continue with supportive cares. If symptoms worsen or do not improve in the next week to return to be seen or to follow up with PCP.  Patient verbalized understanding and agreeable to plan.    Final Clinical Impressions(s) / UC Diagnoses   Final diagnoses:  Viral URI with cough    ED Discharge Orders        Ordered    fluticasone (FLONASE) 50 MCG/ACT nasal spray  Daily     09/27/17 1130    predniSONE  (DELTASONE) 20 MG tablet  Daily with breakfast     09/27/17 1130    albuterol (PROVENTIL HFA;VENTOLIN  HFA) 108 (90 Base) MCG/ACT inhaler  Every 6 hours PRN     09/27/17 1130       Controlled Substance Prescriptions [place] Controlled Substance Registry consulted? Not Applicable   [name], NP 09/27/17 1138

## 2017-09-27 NOTE — ED Triage Notes (Signed)
Pt here for URI symptoms since Tuesday. Cough, congestion, sore throat and body aches. Taking cold medication and using pro air.

## 2017-10-09 ENCOUNTER — Other Ambulatory Visit: Payer: Self-pay | Admitting: Obstetrics

## 2017-10-09 DIAGNOSIS — B373 Candidiasis of vulva and vagina: Secondary | ICD-10-CM

## 2017-10-09 DIAGNOSIS — B3731 Acute candidiasis of vulva and vagina: Secondary | ICD-10-CM

## 2018-01-06 ENCOUNTER — Other Ambulatory Visit: Payer: Self-pay | Admitting: Obstetrics

## 2018-01-06 ENCOUNTER — Ambulatory Visit (HOSPITAL_COMMUNITY)
Admission: EM | Admit: 2018-01-06 | Discharge: 2018-01-06 | Disposition: A | Discharge status: Home / Self Care | Payer: Medicaid Other | Attending: Urgent Care | Admitting: Urgent Care

## 2018-01-06 ENCOUNTER — Other Ambulatory Visit: Payer: Self-pay

## 2018-01-06 ENCOUNTER — Encounter (HOSPITAL_COMMUNITY): Payer: Self-pay | Admitting: Emergency Medicine

## 2018-01-06 ENCOUNTER — Telehealth: Payer: Self-pay | Admitting: Obstetrics

## 2018-01-06 DIAGNOSIS — J4530 Mild persistent asthma, uncomplicated: Secondary | ICD-10-CM

## 2018-01-06 DIAGNOSIS — R0602 Shortness of breath: Secondary | ICD-10-CM

## 2018-01-06 DIAGNOSIS — R062 Wheezing: Secondary | ICD-10-CM

## 2018-01-06 DIAGNOSIS — R059 Cough, unspecified: Secondary | ICD-10-CM

## 2018-01-06 DIAGNOSIS — J9801 Acute bronchospasm: Secondary | ICD-10-CM

## 2018-01-06 DIAGNOSIS — R05 Cough: Secondary | ICD-10-CM | POA: Diagnosis not present

## 2018-01-06 MED ORDER — ALBUTEROL SULFATE HFA 108 (90 BASE) MCG/ACT IN AERS: 1.0000 | INHALATION_SPRAY | Freq: Four times a day (QID) | RESPIRATORY_TRACT | 3 refills | Status: DC | PRN

## 2018-01-06 MED ORDER — PREDNISONE 20 MG PO TABS: ORAL_TABLET | ORAL | 0 refills | Status: DC

## 2018-01-06 MED ORDER — BENZONATATE 100 MG PO CAPS: 100.0000 mg | ORAL_CAPSULE | Freq: Three times a day (TID) | ORAL | 0 refills | Status: DC | PRN

## 2018-01-06 MED ORDER — ALBUTEROL SULFATE (2.5 MG/3ML) 0.083% IN NEBU: 2.5000 mg | INHALATION_SOLUTION | Freq: Four times a day (QID) | RESPIRATORY_TRACT | 3 refills | Status: DC | PRN

## 2018-01-06 NOTE — ED Triage Notes (Signed)
C/o productive cough and requests RF on inhaler
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
## 2018-02-04 NOTE — Progress Notes (Signed)
CC: new patient   HPI  Referred by: Dr. Jodi Mourning (will still see him for GYN, noted recent ASCUS pap and plan to return in 1 year to retest)  Medical history: asthma diagnosed by Dr. Elsworth Soho in 2014. Got proair from UC - using this about every day, some days every 4 hours. Had a light blue w pink top inhaler that worked really well. Has been taking loratidine off and on. Yesterday she dropped off an old prednisone paper rx. Also has an albuterol nebulizer.   Itching - saw Dr. Jodi Mourning for this last year without rash, relieved with prednisone dose pack, but has had itching flares since and used old topical steroid cream.   Smoking - cigars about 2-4 day; THC about twice per day blounts  Uses pull out method with her boyfriend   Surgical history: none   Social history:  Has 3 kids, daughters - 47, 46; son 40.  Lives with: 3 kids, boyfriend  Occupation: [employer] Tobacco use: as above Alcohol use: rarely liquor on birthdays Drug use: had ecstasy in her 46s  Exercise - squats and gym membership at [employer] about twice per week. Has been able to lose weight with exercise and diet changes in the past.   ROS: Denies CP, + occasional SOB, denies abdominal pain, dysuria, changes in BMs.   CC, SH/smoking status, and VS noted  Objective: BP 102/70   Pulse 62   Temp 98.6 F (37 C) (Oral)   Ht 5\' 6"  (1.676 m)   Wt 193 lb (87.5 kg)   LMP 01/25/2018   BMI 31.15 kg/m  Gen: NAD, alert, cooperative, and pleasant. HEENT: NCAT, EOMI, PERRL CV: RRR, no murmur Resp: CTAB, no wheezes, non-labored Abd: SNTND, BS present, no guarding or organomegaly Ext: No edema, warm Neuro: Alert and oriented, Speech clear, No gross deficits  Assessment and plan:  Shortness of breath Unclear etiology, has seen pulm several years ago without conclusive PFTs. Referred her to Dr. Valentina Lucks for repeat PFTs. Sounds like she used ?asthmanex in the past with relief. Thought she was wheezing today, but no wheezes on  exam. Counseled on smoking cessation as this a large component of her SOB. Follow up w me after Dr. 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   Chronic urticaria Patient has received several course of steroids, both PO and topical, in the past for rash and itching. No known allergies. Has been sporadically taking claritin. Refilled this and asked her to take daily. If no relief or frequent flares, could consider increasing to BID vs changing to alternative antihistamine.    Orders Placed This Encounter  Procedures  . CBC  . Basic metabolic panel  . Lipid panel  . HIV antibody  . POCT glycosylated hemoglobin (Hb A1C)    Meds ordered this encounter  Medications  . loratadine (CLARITIN) 10 MG tablet    Sig: Take 1 tablet (10 mg total) by mouth daily.    Dispense:  30 tablet    Refill:  11  . albuterol (PROVENTIL HFA;VENTOLIN HFA) 108 (90 Base) MCG/ACT inhaler    Sig: Inhale 1-2 puffs into the lungs every 6 (six) hours as needed for wheezing or shortness of breath.    Dispense:  1 Inhaler    Refill:  3  . albuterol (PROVENTIL) (2.5 MG/3ML) 0.083% nebulizer solution    Sig: Take 3 mLs (2.5 mg total) by nebulization every 6 (six) hours as needed for wheezing or shortness of breath.    Dispense:  75  mL    Refill:  3    Health Maintenance: HIV checked today with labs.   Ralene Ok, MD, PGY2 02/06/2018 9:05 AM

## 2018-02-05 ENCOUNTER — Other Ambulatory Visit: Payer: Self-pay

## 2018-02-05 ENCOUNTER — Ambulatory Visit: Payer: Medicaid Other | Admitting: Family Medicine

## 2018-02-05 ENCOUNTER — Encounter: Payer: Self-pay | Admitting: Family Medicine

## 2018-02-05 VITALS — BP 102/70 | HR 62 | Temp 98.6°F | Ht 66.0 in | Wt 193.0 lb

## 2018-02-05 DIAGNOSIS — Z0000 Encounter for general adult medical examination without abnormal findings: Secondary | ICD-10-CM | POA: Diagnosis not present

## 2018-02-05 DIAGNOSIS — L508 Other urticaria: Secondary | ICD-10-CM

## 2018-02-05 DIAGNOSIS — Z1159 Encounter for screening for other viral diseases: Secondary | ICD-10-CM

## 2018-02-05 DIAGNOSIS — R0602 Shortness of breath: Secondary | ICD-10-CM | POA: Diagnosis not present

## 2018-02-05 DIAGNOSIS — R7309 Other abnormal glucose: Secondary | ICD-10-CM

## 2018-02-05 DIAGNOSIS — J301 Allergic rhinitis due to pollen: Secondary | ICD-10-CM | POA: Diagnosis not present

## 2018-02-05 LAB — POCT GLYCOSYLATED HEMOGLOBIN (HGB A1C): Hemoglobin A1C: 5.3 % (ref 4.0–5.6)

## 2018-02-05 MED ORDER — LORATADINE 10 MG PO TABS: 10.0000 mg | ORAL_TABLET | Freq: Every day | ORAL | 11 refills | Status: DC

## 2018-02-05 MED ORDER — ALBUTEROL SULFATE (2.5 MG/3ML) 0.083% IN NEBU: 2.5000 mg | INHALATION_SOLUTION | Freq: Four times a day (QID) | RESPIRATORY_TRACT | 3 refills | Status: DC | PRN

## 2018-02-05 MED ORDER — ALBUTEROL SULFATE HFA 108 (90 BASE) MCG/ACT IN AERS: 1.0000 | INHALATION_SPRAY | Freq: Four times a day (QID) | RESPIRATORY_TRACT | 3 refills | Status: DC | PRN

## 2018-02-05 NOTE — Patient Instructions (Signed)
It was a pleasure to see you today! Thank you for choosing [facility] for your primary care. Taylor Wilkinson was seen for new patient.   Our plans for today were:  I will call you if labs are abnormal.   See me back in 1-2 months.   You should return to our clinic to see Dr. Lindell Noe in 1 month for asthma.   Best,  Dr. Lindell Noe

## 2018-02-06 DIAGNOSIS — R0602 Shortness of breath: Secondary | ICD-10-CM | POA: Insufficient documentation

## 2018-02-06 DIAGNOSIS — L508 Other urticaria: Secondary | ICD-10-CM | POA: Insufficient documentation

## 2018-02-06 LAB — BASIC METABOLIC PANEL
BUN / CREAT RATIO: 11 (ref 9–23)
BUN: 10 mg/dL (ref 6–24)
CALCIUM: 9.7 mg/dL (ref 8.7–10.2)
CO2: 20 mmol/L (ref 20–29)
CREATININE: 0.87 mg/dL (ref 0.57–1.00)
Chloride: 106 mmol/L (ref 96–106)
GFR, EST AFRICAN AMERICAN: 95 mL/min/{1.73_m2} (ref >59)
GFR, EST NON AFRICAN AMERICAN: 82 mL/min/{1.73_m2} (ref >59)
Glucose: 79 mg/dL (ref 65–99)
Potassium: 4.2 mmol/L (ref 3.5–5.2)
Sodium: 139 mmol/L (ref 134–144)

## 2018-02-06 LAB — CBC
HEMATOCRIT: 43.5 % (ref 34.0–46.6)
Hemoglobin: 14.6 g/dL (ref 11.1–15.9)
MCH: 31.7 pg (ref 26.6–33.0)
MCHC: 33.6 g/dL (ref 31.5–35.7)
MCV: 95 fL (ref 79–97)
Platelets: 223 10*3/uL (ref 150–450)
RBC: 4.6 x10E6/uL (ref 3.77–5.28)
RDW: 15.1 % (ref 12.3–15.4)
WBC: 5.2 10*3/uL (ref 3.4–10.8)

## 2018-02-06 LAB — LIPID PANEL
CHOL/HDL RATIO: 4.6 ratio — AB (ref 0.0–4.4)
Cholesterol, Total: 197 mg/dL (ref 100–199)
HDL: 43 mg/dL (ref >39)
LDL CALC: 140 mg/dL — AB (ref 0–99)
Triglycerides: 71 mg/dL (ref 0–149)
VLDL CHOLESTEROL CAL: 14 mg/dL (ref 5–40)

## 2018-02-06 LAB — HIV ANTIBODY (ROUTINE TESTING W REFLEX): HIV SCREEN 4TH GENERATION: NONREACTIVE

## 2018-02-06 NOTE — Assessment & Plan Note (Signed)
Unclear etiology, has seen pulm several years ago without conclusive PFTs. Referred her to Dr. Valentina Lucks for repeat PFTs. Sounds like she used ?asthmanex in the past with relief. Thought she was wheezing today, but no wheezes on exam. Counseled on smoking cessation as this a large component of her SOB. Follow up w me after Dr. 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

## 2018-02-06 NOTE — Assessment & Plan Note (Signed)
Patient has received several course of steroids, both PO and topical, in the past for rash and itching. No known allergies. Has been sporadically taking claritin. Refilled this and asked her to take daily. If no relief or frequent flares, could consider increasing to BID vs changing to alternative antihistamine.

## 2018-02-13 ENCOUNTER — Ambulatory Visit (INDEPENDENT_AMBULATORY_CARE_PROVIDER_SITE_OTHER): Payer: Medicaid Other | Admitting: Pharmacist

## 2018-02-13 ENCOUNTER — Telehealth: Payer: Self-pay

## 2018-02-13 ENCOUNTER — Encounter: Payer: Self-pay | Admitting: Pharmacist

## 2018-02-13 DIAGNOSIS — R0602 Shortness of breath: Secondary | ICD-10-CM

## 2018-02-13 DIAGNOSIS — Z720 Tobacco use: Secondary | ICD-10-CM | POA: Diagnosis not present

## 2018-02-13 DIAGNOSIS — F17210 Nicotine dependence, cigarettes, uncomplicated: Secondary | ICD-10-CM | POA: Insufficient documentation

## 2018-02-13 MED ORDER — FLUTICASONE FUROATE-VILANTEROL 100-25 MCG/INH IN AEPB: 1.0000 | INHALATION_SPRAY | Freq: Every day | RESPIRATORY_TRACT | 0 refills | Status: DC

## 2018-02-13 MED ORDER — FLUTICASONE FUROATE-VILANTEROL 100-25 MCG/INH IN AEPB: 1.0000 | INHALATION_SPRAY | Freq: Every day | RESPIRATORY_TRACT | 3 refills | Status: DC

## 2018-02-13 NOTE — Assessment & Plan Note (Signed)
Tobacco use disorder with moderate nicotine dependence of 18 years duration in a patient who is good candidate for success because of high level of motivation. Discussed mechanism to help her remain tobacco free. Encouraged cessation of marijuana products however eventually agreed on short term goal of reducing marijuana product use.

## 2018-02-13 NOTE — Patient Instructions (Addendum)
Nice to meet you and your daughter! Your breathing test is telling us that you have both asthma and COPD. We are starting you on a medication called Breo 13mcg/25 mcg. Inhale one dose once a day. Rinse your mouth out after use. Use the albuterol (orange pump) when you need it. Goal is to stop using blacks today and be free of smoking altogether by thanksgiving!  Come back to see [place] in 1 month.

## 2018-02-13 NOTE — Progress Notes (Signed)
S:    Patient arrives today for repeat PFT.   Presents for lung function evaluation.  Patient was referred by Dr. Lindell Noe (referred on 02/05/2018).  Patient was last seen by Primary Care Provider on 02/05/2018.  Patient reports breathing has been getting worse over the past few months (voice has become more raspier and labored). Patient endorses atopic sx consistent with atopic dermatitis on back of arms. Per patient, MD advised her to take two 10 mg loratadine tablets one week ago, and patient feels that it has helped her arms from itching.  Patient reports adherence to medications Patient reports last dose of asthma medications was today. Current asthma medications: albuterol (Proventil) Rescue inhaler use frequency: two times daily as prophylaxis, always before exercise  Level of asthma sx control- in the last 4 weeks: Question Scoring Patient Score  Daytime sx more than twice a week Yes (1) No (0) 1  Any nighttime waking due to asthma Yes (1) No (0) 1 - every night  Reliever needed >2x/week Yes (1) No (0) 1 - takes everyday  Any activity limitation due to asthma Yes (1) No (0) 0   Total Score 3  Well controlled - 0, partly controlled - 1-2, uncontrolled 3-4  Age when started using tobacco on a daily basis 13 years. Brand smoked unknown. Number of cigarettes/day 6. Estimated nicotine content per cigarette 1mg . Smoked cigarettes for 14 years and then switched to ~three [employer] a day. Also reports smoking marijuana recreational and harder for her to quit this. Reports she smokes 3 blunts/day. Hesitant to agree to give up marijuana but reports she is quitting all tobacco products today. Estimated nicotine intake per day 6-10mg .    Most recent quit attempt: none  Longest time ever been tobacco free: none  Medications used in past cessation efforts include: none Rates IMPORTANCE of quitting tobacco on 1-10 scale of 10. Rates CONFIDENCE of quitting tobacco on 1-10 scale of  7.  Most common triggers to use tobacco include; stress from children  Motivation to quit: children and health.  O: Physical Exam  Constitutional: She appears well-developed and well-nourished.     Review of Systems  Respiratory: Positive for cough and shortness of breath.   All other systems reviewed and are negative.    Vitals:   02/13/18 1602  BP: 136/84  Pulse: 85  SpO2: 99%    See "scanned report" or Documentation Flowsheet (discrete results - PFTs) for Spirometry results. Patient provided good effort while attempting spirometry.   Lung Age = 11 Albuterol Neb  Lot# [id number]    Exp. 05/11/19  A/P: Patient has been experiencing SOB and taking scheduled albuterol.  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 Post nebulized albuterol tx revealed improvement in both FEV1 and FVC indicating some reversibility however FEV1/FVC remains <70% post-bronchodilator use -Start Breo Ellipta 164mcg/25 mcg 1 inhalation once daily. Patient has [place] Medicaid that will require PA however she should qualify as she has already failed albuterol and duonebs, 2 preferred agents. Samples provided x1 month. -Continue PRN albuterol -Educated patient on purpose, proper use, potential adverse effects including risk of esophageal candidiasis and need to rinse mouth after each use.  -Reviewed results of pulmonary function tests.   Tobacco use disorder with moderate nicotine dependence of 18 years duration in a patient who is good candidate for success because of high level of motivation. Discussed mechanism to help her remain tobacco free. Encouraged cessation of marijuana products however eventually agreed on short  term goal of reducing marijuana product use.   Written information provided.  F/U Rx Clinic Visit  1 month.  Total time in face-to-face counseling 30 minutes.  Patient seen with Nida Boatman, PharmD, PGY1 Pharmacy Resident and Deirdre Pippins, PharmD, BCPS, PGY2 Pharmacy  Resident.   Marland Kitchen

## 2018-02-13 NOTE — Assessment & Plan Note (Signed)
Patient has been experiencing SOB and taking scheduled albuterol.  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 Post nebulized albuterol tx revealed improvement in both FEV1 and FVC indicating some reversibility however FEV1/FVC remains <70% post-bronchodilator use -Start Breo Ellipta 164mcg/25 mcg 1 inhalation once daily. Patient has [place] Medicaid that will require PA however she should qualify as she has already failed albuterol and duonebs, 2 preferred agents. Samples provided x1 month. -Continue PRN albuterol -Educated patient on purpose, proper use, potential adverse effects including risk of esophageal candidiasis and need to rinse mouth after each use.  -Reviewed results of pulmonary function tests.

## 2018-02-13 NOTE — Telephone Encounter (Signed)
Received fax from [place] requesting prior authorization of Breo Ellipta. Form placed in pharmD's box along with Medicaid formulary. Danley Danker, RN [facility] RN)

## 2018-02-14 NOTE — Telephone Encounter (Signed)
Per Dr Valentina Lucks, samples were given for one month and then will reevaluate. Do not pursue PA now. [facility] informed.  Danley Danker, RN [facility] RN)

## 2018-02-14 NOTE — Progress Notes (Signed)
Patient ID: Taylor Wilkinson, female   DOB: 09/19/1975, 41 y.o.   MRN: [id number] Reviewed: Agree with Dr. Koval's documentation and management. 

## 2018-03-04 NOTE — Telephone Encounter (Signed)
Second PA request from [facility] for [facility]. Please advise.  Danley Danker, RN [facility] RN)

## 2018-03-04 NOTE — Telephone Encounter (Signed)
Pt calls to ask about PA.  Informed of message from Dr. Valentina Lucks.  Pt made appt on 03/27/18 but she will need another sample before then.  She will be leaving for [place] tomorrow, but she has enough to last until she comes back.  Will forward to Dr. Valentina Lucks.  Viraaj Vorndran, Salome Spotted, [place]

## 2018-03-11 MED ORDER — FLUTICASONE FUROATE-VILANTEROL 200-25 MCG/INH IN AEPB: 1.0000 | INHALATION_SPRAY | Freq: Every day | RESPIRATORY_TRACT | 0 refills | Status: DC

## 2018-03-11 NOTE — Telephone Encounter (Signed)
Pts last dose it TODAY.  Will speak with Dr. Valentina Lucks about samples as I did not see any in med room. Clinton Sawyer, Salome Spotted, [place]

## 2018-03-11 NOTE — Addendum Note (Signed)
Addended by: Christen Bame D on: 03/11/2018 02:08 PM   Modules accepted: Orders

## 2018-03-11 NOTE — Telephone Encounter (Signed)
Spoke with Dr. Valentina Lucks.  He advises giving her the breo 200 to last until appt on the 18th (2 boxes).  Pt informed and agreeable, she will come to pick up now. Nunzio Banet, Salome Spotted, CMA

## 2018-03-14 ENCOUNTER — Telehealth: Payer: Self-pay | Admitting: Family Medicine

## 2018-03-14 ENCOUNTER — Telehealth: Payer: Self-pay

## 2018-03-14 DIAGNOSIS — R0602 Shortness of breath: Secondary | ICD-10-CM

## 2018-03-14 MED ORDER — FLUTICASONE FUROATE-VILANTEROL 100-25 MCG/INH IN AEPB: 1.0000 | INHALATION_SPRAY | Freq: Every day | RESPIRATORY_TRACT | 0 refills | Status: DC

## 2018-03-14 NOTE — Telephone Encounter (Signed)
Pt calls back.  She states that the Memory Dance is not helping.  When I asked her to elaborate she started yelling " I told you it is not working, what do you mean"?  Calmly explained that I was asking what symptoms she was having.    She said that the Breo 200 makes her feel like she is having an asthma attack and "she is not taking that anymore because it does not help like the Breo 100"  She has picked up her albuterol pump because "it helps".   Advised I would send message to PCP, because we still do not have the Breo 100 samples. Allyna Pittsley, Salome Spotted, CMA

## 2018-03-14 NOTE — Telephone Encounter (Signed)
Patient called that [facility] needs PA. Clinical questions submitted via [employer]. Med approved for 03/14/18-03/14/19. [place] #[id number]. [facility] notified.  Danley Danker, RN [facility] RN)

## 2018-03-14 NOTE — Telephone Encounter (Signed)
She is not doing well on 200 mcg of Breo, she is upset that the 100 mcg had not been called in to her pharmacy. She does better on the 100 mcg Breo.  As discussed with her, 244mcg is a higher does and is supposed to help better, unless the dose is too strong for her.  I will go ahead and escribe Breo 100 mcg, she is warned this might require prior authorization which I will complete if I get the request from the pharmacy today. Otherwise continue current regimen, watch for symptoms, go to the ED if symptomatic.   She agreed with the plan and verbalized understanding.

## 2018-03-24 ENCOUNTER — Other Ambulatory Visit: Payer: Self-pay | Admitting: Obstetrics

## 2018-03-24 DIAGNOSIS — Z1231 Encounter for screening mammogram for malignant neoplasm of breast: Secondary | ICD-10-CM

## 2018-03-27 ENCOUNTER — Ambulatory Visit: Payer: Medicaid Other | Admitting: Pharmacist

## 2018-03-27 ENCOUNTER — Encounter: Payer: Self-pay | Admitting: Pharmacist

## 2018-03-27 DIAGNOSIS — R0602 Shortness of breath: Secondary | ICD-10-CM | POA: Diagnosis not present

## 2018-03-27 DIAGNOSIS — Z720 Tobacco use: Secondary | ICD-10-CM

## 2018-03-27 NOTE — Assessment & Plan Note (Signed)
Tobacco use disorder with moderate nicotine dependence of 18 years duration in a patient who is good candidate for success because of high level of motivation. Discussed strategies for cessation - Set tobacco Quit Date of 04/10/2018.  - Encouraged cessation of marijuana products, however, patient will focus on short term goal of tobacco cessation then focus on marijuana cessation.

## 2018-03-27 NOTE — Assessment & Plan Note (Signed)
Patient has been experiencing SOB and taking albuterol typically twice daily since finishing Breo Ellipta samples. Pharmacy was contacted while patient was in clinic and pharmacist confirmed she was able to run Breo 100 mcg/25 mcg through insurance today. Patient plans on picking up Breo. - Resume Breo 100 mcg/25 mcg one inhalation daily - Continue PRN albuterol - Educated patient on purpose, proper use, potential adverse effects including risk of esophageal candidiasis and need to rinse mouth after each use.

## 2018-03-27 NOTE — Progress Notes (Signed)
Patient ID: Taylor Wilkinson, female   DOB: [birth year], 42 y.o.   MRN: [id number] Reviewed: Agree with Dr. Graylin Shiver documentation and management.

## 2018-03-27 NOTE — Patient Instructions (Addendum)
It was great to see you today! Excellent work on the progress you've made.   Pick up the BREO inhaler from the pharmacy and use once daily. Continue to use the albuterol (PROAIR) as needed.  Let's set up an appointment with [place] (Dr. Valentina Lucks) in early August.

## 2018-03-27 NOTE — Progress Notes (Signed)
   S:  Patient arrives today in good spirits for follow-up of Breo addition and evaluation/assistance with tobacco dependence.  Patient was referred by Dr. Lindell Noe on 02/05/2018, and was last seen by PCP on that date. Last Rx Clinic visit 02/13/2018. At that time, Breo Ellipta 110mcg/25 mcg 1 inhalation once daily was started and given as samples. Patient was given samples for 200 mcg/25 mcg on 03/11/2018, however, patient did not tolerate this increased dose. PA required for this medication and was approved on 03/14/2018, but pharmacy was unable to fill 100 mcg/25 mcg.  Age when started using tobacco on a daily basis 13 years. ~1 Black & Milds a day (uses one split up after all three meals). Also reports smoking marijuana recreational and harder for her to quit this. Reports she smokes blunts/day. Would like to give up tobacco products and then focus on quitting smoking marijuana. Estimated nicotine intake per day 6-10mg   Most recent quit attempt none Longest time ever been tobacco free none.  Medications used in past cessation efforts include: none  Most common triggers to use tobacco include: routine (after meals), stress  Motivation to quit: health- wants to be here for her children  A/P: Tobacco use disorder with moderate nicotine dependence of 18 years duration in a patient who is good candidate for success because of high level of motivation. Discussed strategies for cessation - Set tobacco Quit Date of 04/10/2018.  - Encouraged cessation of marijuana products, however, patient will focus on short term goal of tobacco cessation then focus on marijuana cessation.  Patient has been experiencing SOB and taking albuterol typically twice daily since finishing Breo Ellipta samples. Pharmacy was contacted while patient was in clinic and pharmacist confirmed she was able to run Breo 100 mcg/25 mcg through insurance today. Patient plans on picking up Breo. - Resume Breo 100 mcg/25 mcg one inhalation  daily - Continue PRN albuterol - Educated patient on purpose, proper use, potential adverse effects including risk of esophageal candidiasis and need to rinse mouth after each use.    Written information provided.  F/U Rx Clinic Visit in 4-6 weeks  Total time in face-to-face counseling 30 minutes.  Patient seen with Catie Darnelle Maffucci, PharmD, PGY2 Pharmacy Resident.

## 2018-05-05 ENCOUNTER — Ambulatory Visit: Payer: Medicaid Other

## 2018-05-23 ENCOUNTER — Other Ambulatory Visit: Payer: Self-pay | Admitting: Urgent Care

## 2018-05-31 ENCOUNTER — Other Ambulatory Visit: Payer: Self-pay | Admitting: Urgent Care

## 2018-06-06 ENCOUNTER — Inpatient Hospital Stay: Admission: RE | Admit: 2018-06-06 | Payer: Medicaid Other | Source: Ambulatory Visit

## 2018-06-23 ENCOUNTER — Other Ambulatory Visit: Payer: Self-pay

## 2018-06-23 ENCOUNTER — Ambulatory Visit (HOSPITAL_COMMUNITY)
Admission: EM | Admit: 2018-06-23 | Discharge: 2018-06-23 | Disposition: A | Discharge status: Home / Self Care | Payer: Medicaid Other | Attending: Family Medicine | Admitting: Family Medicine

## 2018-06-23 ENCOUNTER — Encounter (HOSPITAL_COMMUNITY): Payer: Self-pay | Admitting: Emergency Medicine

## 2018-06-23 DIAGNOSIS — F17290 Nicotine dependence, other tobacco product, uncomplicated: Secondary | ICD-10-CM | POA: Insufficient documentation

## 2018-06-23 DIAGNOSIS — J209 Acute bronchitis, unspecified: Secondary | ICD-10-CM | POA: Insufficient documentation

## 2018-06-23 DIAGNOSIS — R05 Cough: Secondary | ICD-10-CM

## 2018-06-23 DIAGNOSIS — Z79899 Other long term (current) drug therapy: Secondary | ICD-10-CM | POA: Diagnosis not present

## 2018-06-23 DIAGNOSIS — J029 Acute pharyngitis, unspecified: Secondary | ICD-10-CM

## 2018-06-23 DIAGNOSIS — R062 Wheezing: Secondary | ICD-10-CM

## 2018-06-23 LAB — POCT RAPID STREP A: STREPTOCOCCUS, GROUP A SCREEN (DIRECT): NEGATIVE

## 2018-06-23 MED ORDER — IPRATROPIUM-ALBUTEROL 0.5-2.5 (3) MG/3ML IN SOLN
3.0000 mL | Freq: Once | RESPIRATORY_TRACT | Status: AC
  Administered 2018-06-23: 3 mL via RESPIRATORY_TRACT

## 2018-06-23 MED ORDER — ALBUTEROL SULFATE HFA 108 (90 BASE) MCG/ACT IN AERS: 1.0000 | INHALATION_SPRAY | Freq: Four times a day (QID) | RESPIRATORY_TRACT | 0 refills | Status: DC | PRN

## 2018-06-23 MED ORDER — PREDNISONE 50 MG PO TABS: 50.0000 mg | ORAL_TABLET | Freq: Every day | ORAL | 0 refills | Status: AC

## 2018-06-23 MED ORDER — IPRATROPIUM-ALBUTEROL 0.5-2.5 (3) MG/3ML IN SOLN
RESPIRATORY_TRACT | Status: AC
  Filled 2018-06-23: qty 3

## 2018-06-23 MED ORDER — BENZONATATE 200 MG PO CAPS: 200.0000 mg | ORAL_CAPSULE | Freq: Three times a day (TID) | ORAL | 0 refills | Status: AC | PRN

## 2018-06-23 NOTE — Discharge Instructions (Addendum)
We gave you a breathing treatment today to help with your wheezing, please continue to use albuterol inhaler as needed for shortness of breath wheezing, chest tightness Please begin taking prednisone daily with food for the next 5 days May use Tessalon as needed for cough Please continue your efforts to cut back on smoking  For your nasal congestion; continue Claritin, may add in Flonase nasal spray.  Please follow-up if developing worsening breathing, shortness of breath, chest discomfort, fever, symptoms not improving in approximately 1 to 2 weeks.

## 2018-06-23 NOTE — ED Provider Notes (Addendum)
[place]    CSN: [id number] Arrival date & time: 06/23/18  1148     History   Chief Complaint Chief Complaint  Patient presents with  . URI    HPI Taylor Wilkinson is a 42 y.o. female history of asthma, tobacco use presenting today for evaluation of URI symptoms.  Patient has had URI symptoms including cough, congestion, sore throat.  Symptoms began on Friday, persisted for the past 4 days.  She has also developed a sore throat and pain with swallowing.  Denies fevers.  Her cough has become productive.  She is using her Breo daily as prescribed, states that she is out of her albuterol.  She is also been using Claritin, NyQuil as well as honey and tea.  Denies nausea or vomiting.  HPI  Past Medical History:  Diagnosis Date  . Asthma   . Bronchitis   . Miscarriage     Patient Active Problem List   Diagnosis Date Noted  . Tobacco abuse 02/13/2018  . Shortness of breath 02/06/2018  . Chronic urticaria 02/06/2018  . Tachycardia 01/04/2013    Past Surgical History:  Procedure Laterality Date  . NO PAST SURGERIES      OB History    Gravida  6   Para  4   Term  4   Preterm      AB  1   Living  4     SAB  1   TAB      Ectopic      Multiple      Live Births               Home Medications    Prior to Admission medications   Medication Sig Start Date End Date Taking? Authorizing Provider  fluticasone furoate-vilanterol (BREO ELLIPTA) 100-25 MCG/INH AEPB Inhale 1 puff into the lungs daily. 03/14/18  Yes Kinnie Feil, MD  loratadine (CLARITIN) 10 MG tablet Take 1 tablet (10 mg total) by mouth daily. 02/05/18  Yes Sela Hilding, MD  albuterol (PROVENTIL HFA;VENTOLIN HFA) 108 (90 Base) MCG/ACT inhaler Inhale 1-2 puffs into the lungs every 6 (six) hours as needed for wheezing or shortness of breath. 06/23/18   [name], PA-C  albuterol (PROVENTIL) (2.5 MG/3ML) 0.083% nebulizer solution Take 3 mLs (2.5 mg total) by  nebulization every 6 (six) hours as needed for wheezing or shortness of breath. 02/05/18   Sela Hilding, MD  benzonatate (TESSALON) 200 MG capsule Take 1 capsule (200 mg total) by mouth 3 (three) times daily as needed for up to 7 days for cough. 06/23/18 06/30/18  [name], PA-C  predniSONE (DELTASONE) 50 MG tablet Take 1 tablet (50 mg total) by mouth daily for 5 days. With food 06/23/18 06/28/18  [name], Elesa Hacker, PA-C    Family History Family History  Problem Relation Age of Onset  . Hypertension Mother   . Diabetes Mother   . Asthma Brother     Social History Social History   Tobacco Use  . Smoking status: Current Every Day Smoker    Packs/day: 0.20    Years: 10.00    Pack years: 2.00    Types: Cigars  . Smokeless tobacco: Never Used  . Tobacco comment: 1 cig per day 8/19  -  black and milds  Substance Use Topics  . Alcohol use: Yes    Comment: social  . Drug use: Yes    Types: Marijuana    Comment: marijuana--blunt a day--maybe  2 a day     Allergies   Patient has no known allergies.   Review of Systems Review of Systems  Constitutional: Negative for activity change, appetite change, chills, fatigue and fever.  HENT: Positive for congestion, rhinorrhea, sinus pressure and sore throat. Negative for ear pain and trouble swallowing.   Eyes: Negative for discharge and redness.  Respiratory: Positive for cough. Negative for chest tightness and shortness of breath.   Cardiovascular: Negative for chest pain.  Gastrointestinal: Negative for abdominal pain, diarrhea, nausea and vomiting.  Musculoskeletal: Negative for myalgias.  Skin: Negative for rash.  Neurological: Negative for dizziness, light-headedness and headaches.     Physical Exam Triage Vital Signs ED Triage Vitals [06/23/18 1231]  Enc Vitals Group     BP (!) 122/92     Pulse Rate 75     Resp      Temp 98.2 F (36.8 C)     Temp Source Oral     SpO2 100 %     Weight      Height       Head Circumference      Peak Flow      Pain Score 6     Pain Loc      Pain Edu?      Excl. in [place]?    No data found.  Updated Vital Signs BP (!) 122/92 (BP Location: Left Arm)   Pulse 75   Temp 98.2 F (36.8 C) (Oral)   LMP 06/17/2018   SpO2 100%   Visual Acuity Right Eye Distance:   Left Eye Distance:   Bilateral Distance:    Right Eye Near:   Left Eye Near:    Bilateral Near:     Physical Exam  Constitutional: She appears well-developed and well-nourished. No distress.  HENT:  Head: Normocephalic and atraumatic.  Bilateral ears without tenderness to palpation of external auricle, tragus and mastoid, EAC's without erythema or swelling, TM's with good bony landmarks and cone of light. Non erythematous.  Oral mucosa pink and moist, no tonsillar enlargement or exudate. Posterior pharynx patent and nonerythematous, no uvula deviation or swelling. Normal phonation.  Eyes: Conjunctivae are normal.  Neck: Neck supple.  Cardiovascular: Normal rate and regular rhythm.  No murmur heard. Pulmonary/Chest: Effort normal. No respiratory distress. She has wheezes.  Mild expiratory wheezing throughout bilateral lung fields, more prominent in lower lungs Breathing comfortably at rest Deep inspiration triggering bronchospasm/cough  Abdominal: Soft. There is no tenderness.  Musculoskeletal: She exhibits no edema.  Neurological: She is alert.  Skin: Skin is warm and dry.  Psychiatric: She has a normal mood and affect.  Nursing note and vitals reviewed.    UC Treatments / Results  Labs (all labs ordered are listed, but only abnormal results are displayed) Labs Reviewed  CULTURE, GROUP A STREP [facility])    EKG None  Radiology No results found.  Procedures Procedures (including critical care time)  Medications Ordered in UC Medications  ipratropium-albuterol (DUONEB) 0.5-2.5 (3) MG/3ML nebulizer solution 3 mL (has no administration in time range)    Initial Impression /  Assessment and Plan / UC Course  I have reviewed the triage vital signs and the nursing notes.  Pertinent labs & imaging results that were available during my care of the patient were reviewed by me and considered in my medical decision making (see chart for details).     Patient with bilateral wheezing, will treat for bronchitis.  Breathing treatment provided in clinic today-patient had  improvement in her symptoms after this, will send home with albuterol inhaler to use at home and continue to use her Breo inhaler.  Prednisone daily for congestion and inflammation in the chest.  Tessalon for cough.  Discussed further recommendations for nasal congestion, sore throat.Discussed strict return precautions. Patient verbalized understanding and is agreeable with plan.  Final Clinical Impressions(s) / UC Diagnoses   Final diagnoses:  Acute bronchitis, unspecified organism     Discharge Instructions     We gave you a breathing treatment today to help with your wheezing, please continue to use albuterol inhaler as needed for shortness of breath wheezing, chest tightness Please begin taking prednisone daily with food for the next 5 days May use Tessalon as needed for cough Please continue your efforts to cut back on smoking  For your nasal congestion; continue Claritin, may add in Flonase nasal spray.  Please follow-up if developing worsening breathing, shortness of breath, chest discomfort, fever, symptoms not improving in approximately 1 to 2 weeks.   ED Prescriptions    Medication Sig Dispense Auth. Provider   albuterol (PROVENTIL HFA;VENTOLIN HFA) 108 (90 Base) MCG/ACT inhaler Inhale 1-2 puffs into the lungs every 6 (six) hours as needed for wheezing or shortness of breath. 1 Inhaler Aleigha Gilani C, PA-C   benzonatate (TESSALON) 200 MG capsule Take 1 capsule (200 mg total) by mouth 3 (three) times daily as needed for up to 7 days for cough. 28 capsule Prisha Hiley C, PA-C    predniSONE (DELTASONE) 50 MG tablet Take 1 tablet (50 mg total) by mouth daily for 5 days. With food 5 tablet Legrande Hao, [place] C, PA-C     Controlled Substance Prescriptions [place] Controlled Substance Registry consulted? Not Applicable   Janith Lima, PA-C 06/23/18 1311    Erminio Nygard, [place] C, [place] 06/23/18 1317

## 2018-06-23 NOTE — ED Triage Notes (Signed)
Pt reports nasal congestion that started on Thursday.  Saturday she began having a scratchy throat and a cough.  She now complains of body aches.  She has been trying DayQuil with no relief.

## 2018-06-24 ENCOUNTER — Ambulatory Visit: Payer: Medicaid Other | Admitting: Family Medicine

## 2018-06-25 LAB — CULTURE, GROUP A STREP (THRC)

## 2018-08-22 ENCOUNTER — Other Ambulatory Visit: Payer: Self-pay | Admitting: Family Medicine

## 2018-08-22 DIAGNOSIS — R0602 Shortness of breath: Secondary | ICD-10-CM

## 2018-08-22 NOTE — Telephone Encounter (Signed)
Please let her know I sent the refills now. Has she gotten a flu shot yet? If not, she would be a high risk for flu complications given smoking and lung problems, she should come to nurse clinic for flu shot.

## 2018-08-22 NOTE — Telephone Encounter (Signed)
Patient completely out and needs today if possible.  Danley Danker, RN [facility] RN)

## 2018-09-01 ENCOUNTER — Ambulatory Visit: Payer: Medicaid Other | Admitting: Obstetrics

## 2018-10-29 NOTE — Telephone Encounter (Signed)
error 

## 2018-12-01 ENCOUNTER — Telehealth: Payer: Self-pay | Admitting: Family Medicine

## 2018-12-01 MED ORDER — ALBUTEROL SULFATE (2.5 MG/3ML) 0.083% IN NEBU: 2.5000 mg | INHALATION_SOLUTION | Freq: Four times a day (QID) | RESPIRATORY_TRACT | 3 refills | Status: DC | PRN

## 2018-12-01 MED ORDER — ALBUTEROL SULFATE HFA 108 (90 BASE) MCG/ACT IN AERS: 1.0000 | INHALATION_SPRAY | Freq: Four times a day (QID) | RESPIRATORY_TRACT | 0 refills | Status: DC | PRN

## 2018-12-01 NOTE — Telephone Encounter (Signed)
Patient with prior diagnosis of "shortness of breath "has been on a Brio and albuterol inhaler.  Has ran out of her albuterol inhaler due to expiration and not due to recent rapid use.  Has been using her Brio consistently daily for approximately 6 months.  Says that she is having minor shortness of breath issues consistent with seasonal asthma exacerbation, seasonal COPD worsening.(Do not have PFT on record to determine exact diagnosis).  She is going to request refill of her albuterol medicine both inhaler and nebulizer.  She is speaking in full sentences with no audible respiratory distress  She has been checking her temperature at home and is not had anything over 100.  She does not have any muscle aches or change in capacity.  She could go for a walk or walk upstairs right now with no specific problem.  She does not think that she is in an emergency.  She also request a work note saying that she was here today which was written for her and delivered to her in the parking lot so that she can avoid coming into the lobby.  We discussed that she should attempt to maintain appropriate social distancing and proper hygiene at work and that if she gets any sick symptoms she should immediately self quarantine at home and that she can call us if she has any questions with a going to the emergency department "just to get tested "is not how our hospital is operating currently and would be more dangerous to her.  The emergency department is for emergency symptoms  Dr. Criss Rosales

## 2018-12-02 NOTE — Telephone Encounter (Signed)
I was preceptor the day of this visit.   

## 2018-12-04 ENCOUNTER — Telehealth: Payer: Self-pay | Admitting: Family Medicine

## 2018-12-04 NOTE — Telephone Encounter (Signed)
Patient stopped by wanted to extend her out of work note that she rec'd until this coming Monday which would be March 30th.  Her phone [phone]

## 2018-12-05 NOTE — Telephone Encounter (Signed)
Routed to Dr. Criss Rosales who wrote this letter to assess whether he would recommend extending this or not.

## 2018-12-05 NOTE — Telephone Encounter (Signed)
Work note with new dates provided now that patient is asymptomatic.  -Dr. Criss Rosales

## 2018-12-22 ENCOUNTER — Other Ambulatory Visit: Payer: Self-pay | Admitting: Family Medicine

## 2018-12-22 DIAGNOSIS — R0602 Shortness of breath: Secondary | ICD-10-CM

## 2018-12-22 MED ORDER — FLUTICASONE FUROATE-VILANTEROL 100-25 MCG/INH IN AEPB: INHALATION_SPRAY | RESPIRATORY_TRACT | 3 refills | Status: DC

## 2018-12-23 NOTE — Telephone Encounter (Signed)
Team, please clarify what pharmacy needs, I refilled this yesterday.

## 2018-12-24 NOTE — Telephone Encounter (Signed)
Pt was able to pick up meds.  No further action needed.  Christen Bame, CMA

## 2018-12-24 NOTE — Telephone Encounter (Signed)
LMOVM for pt to call back and confirm her pharmacy. Taylor Wilkinson Kennon Holter, CMA

## 2019-02-11 ENCOUNTER — Telehealth: Payer: Self-pay | Admitting: *Deleted

## 2019-02-11 ENCOUNTER — Other Ambulatory Visit: Payer: Self-pay

## 2019-02-11 ENCOUNTER — Telehealth (INDEPENDENT_AMBULATORY_CARE_PROVIDER_SITE_OTHER): Payer: Managed Care, Other (non HMO) | Admitting: Family Medicine

## 2019-02-11 DIAGNOSIS — Z711 Person with feared health complaint in whom no diagnosis is made: Secondary | ICD-10-CM

## 2019-02-11 DIAGNOSIS — J45909 Unspecified asthma, uncomplicated: Secondary | ICD-10-CM | POA: Diagnosis not present

## 2019-02-11 DIAGNOSIS — Z20828 Contact with and (suspected) exposure to other viral communicable diseases: Secondary | ICD-10-CM | POA: Diagnosis not present

## 2019-02-11 MED ORDER — ALBUTEROL SULFATE HFA 108 (90 BASE) MCG/ACT IN AERS: INHALATION_SPRAY | RESPIRATORY_TRACT | 1 refills | Status: DC

## 2019-02-11 NOTE — Progress Notes (Signed)
[place] Telemedicine Visit  Patient consented to have virtual visit. Method of visit: Video  Encounter participants: Patient: Taylor Wilkinson - located at home Provider: Rory Percy - located at [facility] Others (if applicable): N/A  Chief Complaint: concerns about COVID  HPI:  Patient works at [employer] and is concerned about the current COVID epidemic. She states her company has had 3 positive cases, she has not come into contact with them but is concerned her work is not taking proper measures to protect their workers against [place] and is concerned because of her "lung problem." She has been wearing a mask and gloves at work, but feels SOB when wearing mask as she lifts heavy items. She denies any known exposure to COVID, no one symptomatic at home. Is particularly concerned as she is around her premature granddaughter. Denies fever, cough, increased shortness of breath. She takes her temperature every day before work. She is using breo daily and albuterol prn. She used albuterol twice this week but mainly preventive before work, she did not have any increased SOB. She requests a letter to her employer explaining her PMH.  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  Assessment/Plan:  COVID concern Explained to patient I could not provide a note for her to be out of work solely based on [place] as she does not have symptoms and has not been in close contact with anyone tested positive for COVID. Per patient request, will provide letter to employer explaining PMH and need for daily controller inhaler and as needed rescue inhaler. Patient will discuss work duties and hours with employer, up to the employer discretion. Return precautions provided should patient become symptomatic or have positive  contact. Patient verbalized understanding.  Time spent during visit with patient: 9 minutes

## 2019-02-11 NOTE — Telephone Encounter (Signed)
Opened in error. Christen Bame, CMA

## 2019-02-12 ENCOUNTER — Telehealth: Payer: Self-pay

## 2019-02-12 NOTE — Telephone Encounter (Signed)
Patient calls nurse line stating she presented the notes [place] wrote for her to HR and they stated if she is considered "high risk" she shouldn't be working. Patient stated HR told her her job would be secure as long as she had a note from provider stating she is high risk and should remain out of work until the pandemic is over. Please advise.    You can fax this directly to her job [phone] and place a copy up front for her records.

## 2019-02-13 NOTE — Telephone Encounter (Signed)
Pt is calling back.  Read message below.  Pt would like Dr. Ky Barban to call her.  She is "not asking her to take me out of work, I just need the letter to say that I am at high risk if I catch Covid".  Will forward to MD. Christen Bame, CMA

## 2019-02-13 NOTE — Telephone Encounter (Signed)
I am unable to provide a letter for her to be out of work as she does not have any symptoms and declines known COVID exposure regardless of her "risk." I provided a letter explaining to her employer what her medical conditions are. It will be up to her employer to decide if she should be out of work.

## 2019-02-16 ENCOUNTER — Encounter: Payer: Self-pay | Admitting: Family Medicine

## 2019-02-16 NOTE — Telephone Encounter (Signed)
Her lung disease does not place her at increased risk for contracting COVID. Her exposure would which is why she should remain 6 ft from others, wear a mask, and wash her hands often. Even given her mild obstructive lung disease, this does not automatically guarantee she would sustain complications but would be at higher risk than the general population. I will put this in a letter for her to pick up.

## 2019-02-16 NOTE — Telephone Encounter (Signed)
Pt called back, needs letter to say she is "at high risk for catching COVID due to her lung disease." This letter is to help her save her job. Ottis Stain, CMA

## 2019-02-17 NOTE — Telephone Encounter (Signed)
Spoke with patient and informed her that letter is up front for pick up.  She would like me to fax it to [phone].  Alina Gilkey,CMA

## 2019-02-18 ENCOUNTER — Telehealth: Payer: Self-pay

## 2019-02-18 NOTE — Telephone Encounter (Signed)
Patient calls nurse line requesting the letter that was written a few days ago to be faxed to her corporate office.   Attention: Encarnacion Slates  [phone]  This has been done.

## 2019-02-23 ENCOUNTER — Other Ambulatory Visit: Payer: Self-pay

## 2019-02-23 ENCOUNTER — Telehealth (INDEPENDENT_AMBULATORY_CARE_PROVIDER_SITE_OTHER): Payer: Managed Care, Other (non HMO) | Admitting: Family Medicine

## 2019-02-23 DIAGNOSIS — J45909 Unspecified asthma, uncomplicated: Secondary | ICD-10-CM | POA: Diagnosis not present

## 2019-02-23 DIAGNOSIS — Z0289 Encounter for other administrative examinations: Secondary | ICD-10-CM | POA: Diagnosis not present

## 2019-02-23 DIAGNOSIS — Z720 Tobacco use: Secondary | ICD-10-CM

## 2019-02-23 DIAGNOSIS — Z711 Person with feared health complaint in whom no diagnosis is made: Secondary | ICD-10-CM

## 2019-02-23 NOTE — Assessment & Plan Note (Signed)
Again explained to patient that even given her chronic mild obstructive lung disease, given that she has not come into known contact with COVID or is a PUI or symptomatic, there is no medical reason to remove her from work. I previously sent a letter to [name] explaining her lung condition per her request. Again reviewed the importance of social distancing, proper hand hygiene and wearing masks.

## 2019-02-23 NOTE — Progress Notes (Signed)
[place] Telemedicine Visit  Patient consented to have virtual visit. Method of visit: Telephone  Encounter participants: Patient: HELIA HAESE - located at home Provider: Rory Percy - located at [facility] Others (if applicable): N/A  Chief Complaint: letter  HPI:  Concern about COVID at work. Works at [employer], has had some positive cases within corporate but has not come into contact with any known positives. She is frequently sanitizing and washing hands, wearing masks, social distancing but is worried she will catch COVID if she continues to work. She is particularly concerned about her lung disease if she should contract COVID. She wants to be removed from work. She denies any symptoms: cough, congestion, fevers, sore throat.  ROS: per HPI  Pertinent PMHx: mild obstructive lung diease on PFTs, tobacco use, chronic urticaria.  Exam:  Respiratory: no respiratory distress, speaks in full sentences  Assessment/Plan:  Worried well Again explained to patient that even given her chronic mild obstructive lung disease, given that she has not come into known contact with COVID or is a PUI or symptomatic, there is no medical reason to remove her from work. I previously sent a letter to [name] explaining her lung condition per her request. Again reviewed the importance of social distancing, proper hand hygiene and wearing masks.  Time spent during visit with patient: 9 minutes

## 2019-02-25 ENCOUNTER — Ambulatory Visit: Payer: Medicaid Other | Admitting: Obstetrics & Gynecology

## 2019-03-03 ENCOUNTER — Ambulatory Visit: Payer: Medicaid Other | Admitting: Obstetrics and Gynecology

## 2019-03-24 ENCOUNTER — Other Ambulatory Visit: Payer: Self-pay | Admitting: Family Medicine

## 2019-03-24 ENCOUNTER — Telehealth: Payer: Self-pay | Admitting: *Deleted

## 2019-03-24 DIAGNOSIS — R0602 Shortness of breath: Secondary | ICD-10-CM

## 2019-03-24 DIAGNOSIS — J45909 Unspecified asthma, uncomplicated: Secondary | ICD-10-CM

## 2019-03-24 MED ORDER — FLUTICASONE-SALMETEROL 250-50 MCG/DOSE IN AEPB: 1.0000 | INHALATION_SPRAY | Freq: Two times a day (BID) | RESPIRATORY_TRACT | 3 refills | Status: DC

## 2019-03-24 MED ORDER — BREO ELLIPTA 100-25 MCG/INH IN AEPB: INHALATION_SPRAY | RESPIRATORY_TRACT | 0 refills | Status: DC

## 2019-03-24 NOTE — Telephone Encounter (Signed)
Pt called [employer].  Taylor Wilkinson is covered but pt has a $6000 deductable.  They state that the following medications may be cheaper for the patient.  Fluticasone-salmaterol wixelia  Will forward to MD.  Christen Bame, CMA   She has picked up the 28 day sample I provided for her.

## 2019-03-24 NOTE — Telephone Encounter (Signed)
Rx sent for advair, since this may be cheaper for the patient.

## 2019-03-24 NOTE — Telephone Encounter (Signed)
Pt informed. She will check price and call tomorrow if there is an issue.  Christen Bame, CMA

## 2019-03-24 NOTE — Progress Notes (Signed)
Rx sent for Advair given insurance noted it would likely be cheaper than Breo as patient has a $6000 deductible.

## 2019-03-24 NOTE — Telephone Encounter (Signed)
Pt called originally because the pharmacy said that medicaid required a PA for Breo.  Pt actually has [employer] now and had not taken the card to [facility].    Called pt and advised to take [facility] card to [facility].  Pt did as requested and meds will cost $356 thru [employer] ( no PA available).  Attempted PA thru medicaid but was denied.  Pt will call insurance and see what meds are covered in the same class.  She has been without breo for 4 days.  Will give 2 samples (one month worth) until we can figure this out.   She will call once she reached out to [facility].  Christen Bame, CMA

## 2019-05-01 ENCOUNTER — Ambulatory Visit (INDEPENDENT_AMBULATORY_CARE_PROVIDER_SITE_OTHER): Payer: Managed Care, Other (non HMO)

## 2019-05-01 ENCOUNTER — Other Ambulatory Visit (HOSPITAL_COMMUNITY)
Admission: RE | Admit: 2019-05-01 | Discharge: 2019-05-01 | Disposition: A | Discharge status: Home / Self Care | Payer: Managed Care, Other (non HMO) | Source: Ambulatory Visit | Attending: Obstetrics | Admitting: Obstetrics

## 2019-05-01 ENCOUNTER — Other Ambulatory Visit: Payer: Self-pay

## 2019-05-01 DIAGNOSIS — N760 Acute vaginitis: Secondary | ICD-10-CM

## 2019-05-01 NOTE — Progress Notes (Signed)
Pt is here for self swab. C/O vaginal discharge and discomfort. Pt instructed on how to do self swab and advised that she will be notified of the results. Pt verbalizes understanding.

## 2019-05-08 LAB — CERVICOVAGINAL ANCILLARY ONLY
Bacterial vaginitis: NEGATIVE
Candida vaginitis: POSITIVE — AB
Chlamydia: NEGATIVE
Neisseria Gonorrhea: NEGATIVE
Trichomonas: NEGATIVE

## 2019-05-11 ENCOUNTER — Telehealth: Payer: Self-pay | Admitting: *Deleted

## 2019-05-11 ENCOUNTER — Other Ambulatory Visit: Payer: Self-pay | Admitting: Obstetrics

## 2019-05-11 DIAGNOSIS — B373 Candidiasis of vulva and vagina: Secondary | ICD-10-CM

## 2019-05-11 DIAGNOSIS — B3731 Acute candidiasis of vulva and vagina: Secondary | ICD-10-CM

## 2019-05-11 MED ORDER — TERCONAZOLE 0.8 % VA CREA: 1.0000 | TOPICAL_CREAM | Freq: Every day | VAGINAL | 2 refills | Status: DC

## 2019-05-11 NOTE — Telephone Encounter (Signed)
-----   Message from [name], MD sent at 05/11/2019  1:00 PM EDT ----- Terazol 3 cream Rx for yeast

## 2019-05-12 ENCOUNTER — Encounter: Payer: Self-pay | Admitting: Family Medicine

## 2019-05-12 ENCOUNTER — Other Ambulatory Visit: Payer: Self-pay

## 2019-05-12 ENCOUNTER — Ambulatory Visit (INDEPENDENT_AMBULATORY_CARE_PROVIDER_SITE_OTHER): Payer: Managed Care, Other (non HMO) | Admitting: Family Medicine

## 2019-05-12 ENCOUNTER — Other Ambulatory Visit: Payer: Self-pay | Admitting: Obstetrics

## 2019-05-12 VITALS — BP 104/82 | HR 76 | Wt 195.4 lb

## 2019-05-12 DIAGNOSIS — Z720 Tobacco use: Secondary | ICD-10-CM

## 2019-05-12 DIAGNOSIS — Z0000 Encounter for general adult medical examination without abnormal findings: Secondary | ICD-10-CM | POA: Diagnosis not present

## 2019-05-12 DIAGNOSIS — R0602 Shortness of breath: Secondary | ICD-10-CM

## 2019-05-12 DIAGNOSIS — J4540 Moderate persistent asthma, uncomplicated: Secondary | ICD-10-CM

## 2019-05-12 MED ORDER — BREO ELLIPTA 100-25 MCG/INH IN AEPB: INHALATION_SPRAY | RESPIRATORY_TRACT | 0 refills | Status: DC

## 2019-05-12 MED ORDER — ALBUTEROL SULFATE HFA 108 (90 BASE) MCG/ACT IN AERS: INHALATION_SPRAY | RESPIRATORY_TRACT | 2 refills | Status: DC

## 2019-05-12 NOTE — Patient Instructions (Addendum)
Thank you for coming to see me today. It was a pleasure. Today we talked about:   Your breathing: I sent a prescription for Breo.  We will try to get it covered for you.  We will get blood work, come back for this tomorrow.  Call 1800-QUIT-NOW for help with stopping smoking. They can assist with free resources such as patches, check-in calls, and counseling.   Make an appointment as soon as possible for a pap.  If you have any questions or concerns, please do not hesitate to call the office at [phone].  Best,   [place] Constable, DO

## 2019-05-12 NOTE — Progress Notes (Signed)
Subjective:  CC -- Annual Physical; With complaints of difficulty with asthma.  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  She states that since needing to switch for insurance reasons.  She thinks that she has been using her albuterol more frequently because her Advair is not working.  She feels that her breathing has been "tighter" since switching to Advair.  Still smoking, down from 6-8 black and milds, now she is to 2-3 black and milds a day.  She is working to continue to decrease.     Cardiovascular: - Risk as of 05/12/2019: The 10-year ASCVD risk score Mikey Bussing DC Jr., et al., 2013) is: 1.1%   Values used to calculate the score:     Age: 43 years     Sex: Female     Is Non-Hispanic African American: Yes     Diabetic: No     Tobacco smoker: Yes     Systolic Blood Pressure: 123456 mmHg     Is BP treated: No     HDL Cholesterol: 43 mg/dL     Total Cholesterol: 197 mg/dL  - Dx Hypertension: no  - Dx Hyperlipidemia: no - Dx Obesity: no  - Physical Activity: yes  - Diabetes: no   Cancer: Colorectal >> Colonoscopy: no, not indicated  Lung >> Tobacco Use: yes, has been decreasing  Breast >> Mammogram: yes, scheduled for one per Dr. Jodi Mourning Cervical/Endometrial >>  - Postmenopausal: no  - Vaginal Bleeding: yes, normal - Pap Smear: yes, Dr. Jodi Mourning GYN, had Pap in 2018 with ASCUS, was due to repeat in 2019, patient states that she did not have this repeated.  - Previous Abnormal Pap: yes, ASCUS * Skin >> Suspicious lesions: no   Social: Alcohol Use: yes, occasional  Tobacco Use: yes, decreasing   - Interested in Quitting: yes, attempting on her own  Other Drugs: no  Risky Sexual Behavior: no  Depression: no   - PHQ2 score: 0 Support and Life at Home: yes   Other: Osteoporosis: no  Flu Vaccine: no    Review of Systems  Constitutional: Negative for chills, fever and weight loss.  HENT: Negative for sore throat.   Eyes: Negative for blurred  vision.  Respiratory: Positive for shortness of breath and wheezing. Negative for cough.   Cardiovascular: Negative for chest pain.  Gastrointestinal: Negative for abdominal pain, blood in stool, constipation, diarrhea, melena, nausea and vomiting.  Genitourinary: Negative for dysuria.  Musculoskeletal: Negative for myalgias.  Neurological: Negative for loss of consciousness.  Psychiatric/Behavioral: Negative for suicidal ideas.     Past Medical History Patient Active Problem List   Diagnosis Date Noted  . Encounter for annual physical exam 05/13/2019  . Moderate persistent asthma without complication 99991111  . Worried well 02/23/2019  . Tobacco abuse 02/13/2018  . Shortness of breath 02/06/2018  . Chronic urticaria 02/06/2018  . Tachycardia 01/04/2013    Medications- reviewed and updated Current Outpatient Medications  Medication Sig Dispense Refill  . albuterol (PROAIR HFA) 108 (90 Base) MCG/ACT inhaler INHALE 1-2 PUFFS INTO THE LUNGS EVERY 6 (SIX) HOURS AS NEEDED FOR WHEEZING OR SHORTNESS OF BREATH. 8 g 2  . albuterol (PROVENTIL) (2.5 MG/3ML) 0.083% nebulizer solution Take 3 mLs (2.5 mg total) by nebulization every 6 (six) hours as needed for wheezing or shortness of breath. 75 mL 3  . fluticasone furoate-vilanterol (BREO ELLIPTA) 100-25 MCG/INH AEPB TAKE 1 PUFF BY MOUTH EVERY DAY 2 each 0  .  Fluticasone-Salmeterol (ADVAIR DISKUS) 250-50 MCG/DOSE AEPB Inhale 1 puff into the lungs 2 (two) times daily. 1 each 3  . loratadine (CLARITIN) 10 MG tablet Take 1 tablet (10 mg total) by mouth daily. 30 tablet 11  . terconazole (TERAZOL 3) 0.8 % vaginal cream Place 1 applicator vaginally at bedtime. 20 g 2   No current facility-administered medications for this visit.     Objective: BP 104/82   Pulse 76   Wt 195 lb 6.4 oz (88.6 kg)   LMP 05/11/2019   SpO2 99%   BMI 31.54 kg/m  Gen: NAD, alert, cooperative with exam HEENT: NCAT, EOMI, PERRL Neck: Supple, no cervical LAD, no  thyromegaly CV: RRR, good S1/S2, no murmur Resp: CTABL, no wheezes, non-labored Abd: Soft, Non Tender, Non Distended, BS present, no guarding or organomegaly Genital Exam: not done Ext: No edema, warm Neuro: Alert and oriented, No gross deficits   Assessment/Plan:  Encounter for annual physical exam Patient overdue for repeat Pap for follow-up of ASCUS.  Patient advised of this.  She will come back at her earliest convenience to perform a Pap and proceed with further evaluation as necessary.  Patient reported that she would schedule this and understands the risk of not returning for a Pap.  Patient also requesting lab work today, therefore will obtain CMP, CBC, lipid panel, hepatitis C antibody, HIV.  Tobacco abuse Discussed with patient importance of continued work towards tobacco cessation given her underlying lung disease especially.  She voiced understanding of this.  She notes that since she has gotten down to 2-3 black and milds a day, she is very motivated to discontinue completely.  She denies any further assistance at this time, but was given information for the quit line.  She was also advised to follow back up with this provider if she would like to discuss further assistance with tobacco cessation.  Shortness of breath Lungs clear on exam.  PFTs performed in July 2019 indicative of asthma.  Patient has been using albuterol inhaler as needed more frequently since switch from Breo to Advair.  She likely does not have good control of her asthma which is causing her subjective shortness of breath.  Moderate persistent asthma without complication Patient not doing well with Advair and notes that she had better control with Breo.  Memory Dance was discontinued due to insurance problems.  Will attempt to send Rx for Brio and complete prior off if able as patient has failed Advair therapy.  If able to switch back to [facility], but still no improvement, would consider starting Singulair.   Orders  Placed This Encounter  Procedures  . CBC    Standing Status:   Future    Number of Occurrences:   1    Standing Expiration Date:   05/11/2020  . Lipid Panel    Standing Status:   Future    Number of Occurrences:   1    Standing Expiration Date:   05/11/2020    Order Specific Question:   Has the patient fasted?    Answer:   No  . Hepatitis C antibody    Standing Status:   Future    Number of Occurrences:   1    Standing Expiration Date:   05/11/2020  . Comprehensive metabolic panel    Standing Status:   Future    Number of Occurrences:   1    Standing Expiration Date:   05/11/2020    Order Specific Question:   Has the patient  fasted?    Answer:   No    Meds ordered this encounter  Medications  . albuterol (PROAIR HFA) 108 (90 Base) MCG/ACT inhaler    Sig: INHALE 1-2 PUFFS INTO THE LUNGS EVERY 6 (SIX) HOURS AS NEEDED FOR WHEEZING OR SHORTNESS OF BREATH.    Dispense:  8 g    Refill:  2  . fluticasone furoate-vilanterol (BREO ELLIPTA) 100-25 MCG/INH AEPB    Sig: TAKE 1 PUFF BY MOUTH EVERY DAY    Dispense:  2 each    Refill:  0    Order Specific Question:   Lot Number?    Answer:   [id number]    Order Specific Question:   Expiration Date?    Answer:   12/09/2019    Order Specific Question:   Manufacturer?    Answer:   [employer] [[id number]]    Order Specific Question:   Quantity    Answer:   Mignon, DO, PGY-2 05/13/2019 10:38 AM

## 2019-05-13 ENCOUNTER — Other Ambulatory Visit: Payer: Managed Care, Other (non HMO)

## 2019-05-13 ENCOUNTER — Other Ambulatory Visit: Payer: Self-pay

## 2019-05-13 DIAGNOSIS — J4540 Moderate persistent asthma, uncomplicated: Secondary | ICD-10-CM | POA: Insufficient documentation

## 2019-05-13 DIAGNOSIS — Z0000 Encounter for general adult medical examination without abnormal findings: Secondary | ICD-10-CM

## 2019-05-13 NOTE — Assessment & Plan Note (Signed)
Patient not doing well with Advair and notes that she had better control with Breo.  Memory Dance was discontinued due to insurance problems.  Will attempt to send Rx for Brio and complete prior off if able as patient has failed Advair therapy.  If able to switch back to [facility], but still no improvement, would consider starting Singulair.

## 2019-05-13 NOTE — Assessment & Plan Note (Signed)
Discussed with patient importance of continued work towards tobacco cessation given her underlying lung disease especially.  She voiced understanding of this.  She notes that since she has gotten down to 2-3 black and milds a day, she is very motivated to discontinue completely.  She denies any further assistance at this time, but was given information for the quit line.  She was also advised to follow back up with this provider if she would like to discuss further assistance with tobacco cessation.

## 2019-05-13 NOTE — Assessment & Plan Note (Signed)
Patient overdue for repeat Pap for follow-up of ASCUS.  Patient advised of this.  She will come back at her earliest convenience to perform a Pap and proceed with further evaluation as necessary.  Patient reported that she would schedule this and understands the risk of not returning for a Pap.  Patient also requesting lab work today, therefore will obtain CMP, CBC, lipid panel, hepatitis C antibody, HIV.

## 2019-05-13 NOTE — Assessment & Plan Note (Signed)
Lungs clear on exam.  PFTs performed in July 2019 indicative of asthma.  Patient has been using albuterol inhaler as needed more frequently since switch from Breo to Advair.  She likely does not have good control of her asthma which is causing her subjective shortness of breath.

## 2019-05-14 LAB — COMPREHENSIVE METABOLIC PANEL
ALT: 11 IU/L (ref 0–32)
AST: 10 IU/L (ref 0–40)
Albumin/Globulin Ratio: 2.1 (ref 1.2–2.2)
Albumin: 4.2 g/dL (ref 3.8–4.8)
Alkaline Phosphatase: 86 IU/L (ref 39–117)
BUN/Creatinine Ratio: 21 (ref 9–23)
BUN: 13 mg/dL (ref 6–24)
Bilirubin Total: 0.2 mg/dL (ref 0.0–1.2)
CO2: 22 mmol/L (ref 20–29)
Calcium: 9.3 mg/dL (ref 8.7–10.2)
Chloride: 109 mmol/L — ABNORMAL HIGH (ref 96–106)
Creatinine, Ser: 0.62 mg/dL (ref 0.57–1.00)
GFR calc Af Amer: 127 mL/min/{1.73_m2} (ref >59)
GFR calc non Af Amer: 110 mL/min/{1.73_m2} (ref >59)
Globulin, Total: 2 g/dL (ref 1.5–4.5)
Glucose: 84 mg/dL (ref 65–99)
Potassium: 4.2 mmol/L (ref 3.5–5.2)
Sodium: 141 mmol/L (ref 134–144)
Total Protein: 6.2 g/dL (ref 6.0–8.5)

## 2019-05-14 LAB — CBC
Hematocrit: 40.7 % (ref 34.0–46.6)
Hemoglobin: 13.6 g/dL (ref 11.1–15.9)
MCH: 32.5 pg (ref 26.6–33.0)
MCHC: 33.4 g/dL (ref 31.5–35.7)
MCV: 97 fL (ref 79–97)
Platelets: 275 10*3/uL (ref 150–450)
RBC: 4.19 x10E6/uL (ref 3.77–5.28)
RDW: 14.6 % (ref 11.7–15.4)
WBC: 4.5 10*3/uL (ref 3.4–10.8)

## 2019-05-14 LAB — LIPID PANEL
Chol/HDL Ratio: 3.5 ratio (ref 0.0–4.4)
Cholesterol, Total: 178 mg/dL (ref 100–199)
HDL: 51 mg/dL (ref >39)
LDL Chol Calc (NIH): 117 mg/dL — ABNORMAL HIGH (ref 0–99)
Triglycerides: 53 mg/dL (ref 0–149)
VLDL Cholesterol Cal: 10 mg/dL (ref 5–40)

## 2019-05-14 LAB — HEPATITIS C ANTIBODY: Hep C Virus Ab: 0.2 s/co ratio (ref 0.0–0.9)

## 2019-05-26 ENCOUNTER — Ambulatory Visit: Payer: Managed Care, Other (non HMO) | Admitting: Family Medicine

## 2019-06-08 NOTE — Progress Notes (Signed)
     Subjective: Chief Complaint  Patient presents with  . Abnormal Pap Smear     HPI: Taylor Wilkinson is a 43 y.o. presenting to clinic today to discuss the following:  1 Pap Smear Presents for Pap smear. Had Pap in 2018 with ASCUS and has not been repeated.  Has no vaginal complaints at present.  2 Tobacco Abuse Had been working on quitting on her own.  Down to 1-2 black and milds a day.  Notes that breathing is improving with this.   3 Breast Rash Notes rash under her breasts and in groin for the last few weeks.  Notes that it itches.  Health Maintenance: Flu vaccination     ROS noted in HPI. Chief complaint noted.  Other Pertinent PMH: Asthma, Tobacco Abuse, Prior ASCUS on Pap Past Medical, Surgical, Social, and Family History Reviewed & Updated per EMR.      Social History   Tobacco Use  Smoking Status Current Every Day Smoker  . Packs/day: 0.20  . Years: 10.00  . Pack years: 2.00  . Types: Cigars  Smokeless Tobacco Never Used  Tobacco Comment   1 cig per day 8/19  -  black and milds   Smoking status noted.    Objective: BP 110/84   Pulse 97   Wt 189 lb 9.6 oz (86 kg)   LMP 06/05/2019 (Exact Date)   SpO2 99%   BMI 30.60 kg/m  Vitals and nursing notes reviewed  Physical Exam:  General: 44 y.o. female in NAD Chest: mildly erythematous patches under bilateral breasts with small papules on edges extending outward Lungs: Breathing comfortably on RA Skin: warm and dry Extremities: No edema  Pelvic exam performed with patient supine.  Chaperone in room.  Bilateral labia without abnormalities, no inguinal LAD palpated.  No obvious rash in groin area.  Cervix exhibits no discharge, no cervix abnormalities.  No vaginal lesions.  Vaginal discharge scant. Pap performed without complication.   No results found for this or any previous visit (from the past 72 hour(s)).  Assessment/Plan:  Encounter for screening for cervical cancer  Pap performed today  with HPV.   Candidal intertrigo Rash consistent with candidal intertrigo given satellite lesions and location.  Rx for ketoconazole sent.  Patient advised that after improvement in rash, to stop using and keep area clean and dry.  Can use baby powder if needed.  Return if no improvement.  Tobacco abuse Congratulated on progress and continue to encourage cessation.  Again notes that she is comfortable doing this on her own.     PATIENT EDUCATION PROVIDED: See AVS    Diagnosis and plan along with any newly prescribed medication(s) were discussed in detail with this patient today. The patient verbalized understanding and agreed with the plan. Patient advised if symptoms worsen return to clinic or ER.   Health Maintainance: declines flu shot, discussed   No orders of the defined types were placed in this encounter.   Meds ordered this encounter  Medications  . ketoconazole (NIZORAL) 2 % cream    Sig: Apply 1 application topically daily.    Dispense:  15 g    Refill:  Curtiss, DO 06/09/2019, 4:40 PM PGY-2 [place]

## 2019-06-09 ENCOUNTER — Other Ambulatory Visit: Payer: Self-pay

## 2019-06-09 ENCOUNTER — Encounter: Payer: Self-pay | Admitting: Family Medicine

## 2019-06-09 ENCOUNTER — Ambulatory Visit (INDEPENDENT_AMBULATORY_CARE_PROVIDER_SITE_OTHER): Payer: Managed Care, Other (non HMO) | Admitting: Family Medicine

## 2019-06-09 ENCOUNTER — Other Ambulatory Visit (HOSPITAL_COMMUNITY)
Admission: RE | Admit: 2019-06-09 | Discharge: 2019-06-09 | Disposition: A | Discharge status: Home / Self Care | Payer: Managed Care, Other (non HMO) | Source: Ambulatory Visit | Attending: Family Medicine | Admitting: Family Medicine

## 2019-06-09 VITALS — BP 110/84 | HR 97 | Wt 189.6 lb

## 2019-06-09 DIAGNOSIS — Z124 Encounter for screening for malignant neoplasm of cervix: Secondary | ICD-10-CM | POA: Diagnosis not present

## 2019-06-09 DIAGNOSIS — B372 Candidiasis of skin and nail: Secondary | ICD-10-CM | POA: Diagnosis not present

## 2019-06-09 DIAGNOSIS — Z720 Tobacco use: Secondary | ICD-10-CM | POA: Diagnosis not present

## 2019-06-09 MED ORDER — KETOCONAZOLE 2 % EX CREA: 1.0000 "application " | TOPICAL_CREAM | Freq: Every day | CUTANEOUS | 0 refills | Status: DC

## 2019-06-09 NOTE — Assessment & Plan Note (Signed)
Pap performed today with HPV.

## 2019-06-09 NOTE — Patient Instructions (Signed)
Thank you for coming to see me today. It was a pleasure. Today we talked about:   Your pap: I will call you if it is abnormal.  You will get a letter if it is normal.  Your rash: I have sent ketoconazole cream for you to use on your rash.  Try to keep the area clean and dry.    Keep working on quitting smoking!   If you have any questions or concerns, please do not hesitate to call the office at [phone].  Best,   [place] Constable, DO

## 2019-06-09 NOTE — Assessment & Plan Note (Signed)
Rash consistent with candidal intertrigo given satellite lesions and location.  Rx for ketoconazole sent.  Patient advised that after improvement in rash, to stop using and keep area clean and dry.  Can use baby powder if needed.  Return if no improvement.

## 2019-06-09 NOTE — Assessment & Plan Note (Signed)
Congratulated on progress and continue to encourage cessation.  Again notes that she is comfortable doing this on her own.

## 2019-06-15 LAB — CYTOLOGY - PAP
Diagnosis: NEGATIVE
High risk HPV: NEGATIVE

## 2019-06-22 ENCOUNTER — Other Ambulatory Visit: Payer: Self-pay | Admitting: Family Medicine

## 2019-06-22 DIAGNOSIS — B372 Candidiasis of skin and nail: Secondary | ICD-10-CM

## 2019-06-28 ENCOUNTER — Encounter (HOSPITAL_COMMUNITY): Payer: Self-pay

## 2019-06-28 ENCOUNTER — Other Ambulatory Visit: Payer: Self-pay

## 2019-06-28 ENCOUNTER — Other Ambulatory Visit: Payer: Self-pay | Admitting: Family Medicine

## 2019-06-28 ENCOUNTER — Ambulatory Visit (HOSPITAL_COMMUNITY)
Admission: EM | Admit: 2019-06-28 | Discharge: 2019-06-28 | Disposition: A | Discharge status: Home / Self Care | Payer: Managed Care, Other (non HMO) | Attending: Emergency Medicine | Admitting: Emergency Medicine

## 2019-06-28 DIAGNOSIS — R21 Rash and other nonspecific skin eruption: Secondary | ICD-10-CM | POA: Diagnosis present

## 2019-06-28 DIAGNOSIS — Z825 Family history of asthma and other chronic lower respiratory diseases: Secondary | ICD-10-CM | POA: Diagnosis not present

## 2019-06-28 DIAGNOSIS — Z7951 Long term (current) use of inhaled steroids: Secondary | ICD-10-CM | POA: Diagnosis not present

## 2019-06-28 DIAGNOSIS — Z833 Family history of diabetes mellitus: Secondary | ICD-10-CM | POA: Diagnosis not present

## 2019-06-28 DIAGNOSIS — F17210 Nicotine dependence, cigarettes, uncomplicated: Secondary | ICD-10-CM | POA: Diagnosis not present

## 2019-06-28 DIAGNOSIS — Z20828 Contact with and (suspected) exposure to other viral communicable diseases: Secondary | ICD-10-CM | POA: Diagnosis not present

## 2019-06-28 DIAGNOSIS — Z79899 Other long term (current) drug therapy: Secondary | ICD-10-CM | POA: Diagnosis not present

## 2019-06-28 DIAGNOSIS — J4521 Mild intermittent asthma with (acute) exacerbation: Secondary | ICD-10-CM

## 2019-06-28 DIAGNOSIS — F1290 Cannabis use, unspecified, uncomplicated: Secondary | ICD-10-CM | POA: Diagnosis not present

## 2019-06-28 DIAGNOSIS — J301 Allergic rhinitis due to pollen: Secondary | ICD-10-CM

## 2019-06-28 DIAGNOSIS — J4540 Moderate persistent asthma, uncomplicated: Secondary | ICD-10-CM

## 2019-06-28 DIAGNOSIS — Z7952 Long term (current) use of systemic steroids: Secondary | ICD-10-CM | POA: Diagnosis not present

## 2019-06-28 MED ORDER — NYSTATIN 100000 UNIT/GM EX POWD: Freq: Three times a day (TID) | CUTANEOUS | 0 refills | Status: DC

## 2019-06-28 MED ORDER — LORATADINE 10 MG PO TABS: 10.0000 mg | ORAL_TABLET | Freq: Every day | ORAL | 11 refills | Status: DC

## 2019-06-28 MED ORDER — PREDNISONE 20 MG PO TABS: 40.0000 mg | ORAL_TABLET | Freq: Every day | ORAL | 0 refills | Status: AC

## 2019-06-28 NOTE — ED Provider Notes (Signed)
Taylor Wilkinson    CSN: [id number] Arrival date & time: 06/28/19  1306      History   Chief Complaint Chief Complaint  Patient presents with  . Asthma    HPI Taylor Wilkinson is a 43 y.o. female.   [facility] Taylor Wilkinson presents with complaints of asthma flare. Increased shortness of breath , wheezing, cough started 10/13. Has been using her nebulizer and inhaler which have helped only for short amount of time. Woke during the night last night feeling shortness of breath. No chest pain . No palpitations. No leg swelling. Wheezing. Otherwise hasn't had fatigue, fever, gi symptoms, nasal drainage or congestion. No known ill contacts. Ran out of her allergy medications. She does smoke marijuana as well as black and milds. Has been in the hospital due to asthma in the past. She also has an itching rash in between breasts. Has been applying a cream which hasn't helped.    ROS per HPI, negative if not otherwise mentioned.      Past Medical History:  Diagnosis Date  . Asthma   . Bronchitis   . Miscarriage     Patient Active Problem List   Diagnosis Date Noted  . Encounter for screening for cervical cancer  06/09/2019  . Candidal intertrigo 06/09/2019  . Moderate persistent asthma without complication 99991111  . Tobacco abuse 02/13/2018  . Shortness of breath 02/06/2018  . Chronic urticaria 02/06/2018  . Tachycardia 01/04/2013    Past Surgical History:  Procedure Laterality Date  . NO PAST SURGERIES      OB History    Gravida  6   Para  4   Term  4   Preterm      AB  1   Living  4     SAB  1   TAB      Ectopic      Multiple      Live Births               Home Medications    Prior to Admission medications   Medication Sig Start Date End Date Taking? Authorizing Provider  albuterol (PROAIR HFA) 108 (90 Base) MCG/ACT inhaler INHALE 1-2 PUFFS INTO THE LUNGS EVERY 6 (SIX) HOURS AS NEEDED FOR WHEEZING OR SHORTNESS OF BREATH. 05/12/19    Meccariello, Bernita Raisin, DO  albuterol (PROVENTIL) (2.5 MG/3ML) 0.083% nebulizer solution Take 3 mLs (2.5 mg total) by nebulization every 6 (six) hours as needed for wheezing or shortness of breath. 12/01/18   Bland, Scott, DO  fluticasone furoate-vilanterol (BREO ELLIPTA) 100-25 MCG/INH AEPB TAKE 1 PUFF BY MOUTH EVERY DAY 05/12/19   Meccariello, Bernita Raisin, DO  Fluticasone-Salmeterol (ADVAIR DISKUS) 250-50 MCG/DOSE AEPB Inhale 1 puff into the lungs 2 (two) times daily. 03/24/19   Meccariello, Bernita Raisin, DO  ketoconazole (NIZORAL) 2 % cream APPLY TO AFFECTED AREA EVERY DAY 06/23/19   Meccariello, Bernita Raisin, DO  loratadine (CLARITIN) 10 MG tablet Take 1 tablet (10 mg total) by mouth daily. 06/28/19   [name], NP  nystatin (MYCOSTATIN/NYSTOP) powder Apply topically 3 (three) times daily. 06/28/19   [name], NP  predniSONE (DELTASONE) 20 MG tablet Take 2 tablets (40 mg total) by mouth daily with breakfast for 5 days. 06/28/19 07/03/19  [name], NP  terconazole (TERAZOL 3) 0.8 % vaginal cream Place 1 applicator vaginally at bedtime. 05/11/19   [name], MD    Family History Family History  Problem Relation Age of  Onset  . Hypertension Mother   . Diabetes Mother   . Asthma Brother     Social History Social History   Tobacco Use  . Smoking status: Current Every Day Smoker    Packs/day: 0.20    Years: 10.00    Pack years: 2.00    Types: Cigars  . Smokeless tobacco: Never Used  . Tobacco comment: 1 cig per day 8/19  -  black and milds  Substance Use Topics  . Alcohol use: Yes    Comment: social  . Drug use: Yes    Types: Marijuana    Comment: marijuana--blunt a day--maybe 2 a day     Allergies   Patient has no known allergies.   Review of Systems Review of Systems   Physical Exam Triage Vital Signs ED Triage Vitals  Enc Vitals Group     BP 06/28/19 1401 132/84     Pulse Rate 06/28/19 1401 70     Resp 06/28/19 1401 20     Temp 06/28/19 1401 98.3  F (36.8 C)     Temp src --      SpO2 06/28/19 1401 100 %     Weight --      Height --      Head Circumference --      Peak Flow --      Pain Score 06/28/19 1404 0     Pain Loc --      Pain Edu? --      Excl. in [place]? --    No data found.  Updated Vital Signs BP 132/84   Pulse 70   Temp 98.3 F (36.8 C)   Resp 20   LMP 06/05/2019 (Exact Date)   SpO2 100%    Physical Exam Constitutional:      General: She is not in acute distress.    Appearance: She is well-developed.  Cardiovascular:     Rate and Rhythm: Normal rate and regular rhythm.     Heart sounds: Normal heart sounds.  Pulmonary:     Effort: Pulmonary effort is normal.     Breath sounds: Examination of the right-lower field reveals wheezing. Examination of the left-lower field reveals wheezing. Wheezing present.     Comments: Occasional faint wheeze at bases; noted shortness of breath at times with long winded discussion; no cough throughout exam; no increased work of breathing  Skin:    General: Skin is warm and dry.     Findings: Rash present. Rash is urticarial.          Comments: Rash in between breasts, she has applied powder so difficult to clearly see color but does appear to have defined edges  Neurological:     Mental Status: She is alert and oriented to person, place, and time.      UC Treatments / Results  Labs (all labs ordered are listed, but only abnormal results are displayed) Labs Reviewed  NOVEL CORONAVIRUS, NAA (HOSP ORDER, SEND-OUT TO REF LAB; TAT 18-24 HRS)    EKG   Radiology No results found.  Procedures Procedures (including critical care time)  Medications Ordered in UC Medications - No data to display  Initial Impression / Assessment and Plan / UC Course  I have reviewed the triage vital signs and the nursing notes.  Pertinent labs & imaging results that were available during my care of the patient were reviewed by me and considered in my medical decision making (see  chart for details).  Will treat as asthma flare with prednisone. Refilled allergy medication as well. Will try nystatin powder to breasts. covid testing also pending due to new onset of cough symptoms, patient is agreeable to this. Return precautions provided. Patient verbalized understanding and agreeable to plan.   Final Clinical Impressions(s) / UC Diagnoses   Final diagnoses:  Mild intermittent asthma with exacerbation  Rash and nonspecific skin eruption     Discharge Instructions     We will treat this as asthma with 5 days of prednisone. May start today.  Use of inhaler as needed for wheezing or shortness of breath.   Self isolate until covid results are back and negative.  Will notify you of any positive findings. You may monitor your results on your MyChart online as well.    I have also sent a powder you can use to your chest rash.  If symptoms worsen or do not improve in the next week to return to be seen or to follow up with your PCP.     ED Prescriptions    Medication Sig Dispense Auth. Provider   loratadine (CLARITIN) 10 MG tablet Take 1 tablet (10 mg total) by mouth daily. 30 tablet [name], NP   nystatin (MYCOSTATIN/NYSTOP) powder Apply topically 3 (three) times daily. 15 g [name], NP   predniSONE (DELTASONE) 20 MG tablet Take 2 tablets (40 mg total) by mouth daily with breakfast for 5 days. 10 tablet [name], NP     PDMP not reviewed this encounter.   [name], NP 06/28/19 [phone]

## 2019-06-28 NOTE — Discharge Instructions (Addendum)
We will treat this as asthma with 5 days of prednisone. May start today.  Use of inhaler as needed for wheezing or shortness of breath.   Self isolate until covid results are back and negative.  Will notify you of any positive findings. You may monitor your results on your MyChart online as well.    I have also sent a powder you can use to your chest rash.  If symptoms worsen or do not improve in the next week to return to be seen or to follow up with your PCP.

## 2019-06-28 NOTE — ED Triage Notes (Signed)
Pt presents with c/o asthma exacerbation, pt states she has been using nebulizer more and more since past Monday, states that it feels as if it not helping symptoms

## 2019-06-29 LAB — NOVEL CORONAVIRUS, NAA (HOSP ORDER, SEND-OUT TO REF LAB; TAT 18-24 HRS): SARS-CoV-2, NAA: NOT DETECTED

## 2019-07-08 ENCOUNTER — Ambulatory Visit (INDEPENDENT_AMBULATORY_CARE_PROVIDER_SITE_OTHER): Payer: Managed Care, Other (non HMO)

## 2019-07-08 ENCOUNTER — Other Ambulatory Visit: Payer: Self-pay

## 2019-07-08 ENCOUNTER — Ambulatory Visit (HOSPITAL_COMMUNITY)
Admission: EM | Admit: 2019-07-08 | Discharge: 2019-07-08 | Disposition: A | Discharge status: Home / Self Care | Payer: Managed Care, Other (non HMO) | Attending: Urgent Care | Admitting: Urgent Care

## 2019-07-08 ENCOUNTER — Encounter (HOSPITAL_COMMUNITY): Payer: Self-pay | Admitting: Urgent Care

## 2019-07-08 DIAGNOSIS — R062 Wheezing: Secondary | ICD-10-CM | POA: Diagnosis not present

## 2019-07-08 DIAGNOSIS — R059 Cough, unspecified: Secondary | ICD-10-CM

## 2019-07-08 DIAGNOSIS — R0602 Shortness of breath: Secondary | ICD-10-CM | POA: Diagnosis not present

## 2019-07-08 DIAGNOSIS — R05 Cough: Secondary | ICD-10-CM

## 2019-07-08 DIAGNOSIS — J4540 Moderate persistent asthma, uncomplicated: Secondary | ICD-10-CM | POA: Diagnosis not present

## 2019-07-08 MED ORDER — MONTELUKAST SODIUM 10 MG PO TABS: 10.0000 mg | ORAL_TABLET | Freq: Every day | ORAL | 0 refills | Status: DC

## 2019-07-08 NOTE — ED Triage Notes (Signed)
Patient presents to Urgent Care with complaints of continued shortness of breath since two weeks ago. Patient reports yesterday at work her sob began causing back pain, so she left work and used her nebulizer machine which helped. Pt called out of work today, tried to get in with her PCP but they were full so she was told to come here for a chest x-ray to check for pneuomnia. Pt states she did finish her course of prednisone.

## 2019-07-08 NOTE — ED Provider Notes (Signed)
MRN: [id number] DOB: [birth year]  Subjective:   Taylor Wilkinson is a 43 y.o. female presenting for recheck on persistent coughing, shortness of breath and wheezing.  Last office visit was 06/28/2019 for the same.  At that time patient had her albuterol refilled and was started on prednisone course.  She has been using her albuterol and nebulizer treatments with temporary relief.  Of note, patient did have negative Covid testing from that same office visit.  She has not had any known COVID-19 contacts since then.  She does practice social distancing well.  She did try to get back in with her PCP but they were unable to work her in, was advised to come to the urgent care for chest x-ray. Denies smoking cigarettes.  No current facility-administered medications for this encounter.   Current Outpatient Medications:  .  albuterol (PROVENTIL) (2.5 MG/3ML) 0.083% nebulizer solution, Take 3 mLs (2.5 mg total) by nebulization every 6 (six) hours as needed for wheezing or shortness of breath., Disp: 75 mL, Rfl: 3 .  albuterol (VENTOLIN HFA) 108 (90 Base) MCG/ACT inhaler, INHALE 1-2 PUFFS INTO THE LUNGS EVERY 6 (SIX) HOURS AS NEEDED FOR WHEEZING OR SHORTNESS OF BREATH., Disp: 6.7 g, Rfl: 2 .  fluticasone furoate-vilanterol (BREO ELLIPTA) 100-25 MCG/INH AEPB, TAKE 1 PUFF BY MOUTH EVERY DAY, Disp: 2 each, Rfl: 0 .  Fluticasone-Salmeterol (ADVAIR DISKUS) 250-50 MCG/DOSE AEPB, Inhale 1 puff into the lungs 2 (two) times daily., Disp: 1 each, Rfl: 3 .  ketoconazole (NIZORAL) 2 % cream, APPLY TO AFFECTED AREA EVERY DAY, Disp: 15 g, Rfl: 0 .  loratadine (CLARITIN) 10 MG tablet, Take 1 tablet (10 mg total) by mouth daily., Disp: 30 tablet, Rfl: 11 .  nystatin (MYCOSTATIN/NYSTOP) powder, Apply topically 3 (three) times daily., Disp: 15 g, Rfl: 0 .  terconazole (TERAZOL 3) 0.8 % vaginal cream, Place 1 applicator vaginally at bedtime., Disp: 20 g, Rfl: 2   No Known Allergies  Past Medical History:  Diagnosis Date  .  Asthma   . Bronchitis   . Miscarriage      Past Surgical History:  Procedure Laterality Date  . NO PAST SURGERIES      ROS  Objective:   Vitals: BP 139/89 (BP Location: Left Arm)   Pulse 81   Temp 99.1 F (37.3 C) (Oral)   Resp 18   SpO2 100%   Physical Exam Constitutional:      General: She is not in acute distress.    Appearance: Normal appearance. She is well-developed. She is not ill-appearing, toxic-appearing or diaphoretic.  HENT:     Head: Normocephalic and atraumatic.     Nose: Nose normal.     Mouth/Throat:     Mouth: Mucous membranes are moist.  Eyes:     Extraocular Movements: Extraocular movements intact.     Pupils: Pupils are equal, round, and reactive to light.  Cardiovascular:     Rate and Rhythm: Normal rate and regular rhythm.     Pulses: Normal pulses.     Heart sounds: Normal heart sounds. No murmur. No friction rub. No gallop.   Pulmonary:     Effort: Pulmonary effort is normal. No respiratory distress.     Breath sounds: No stridor. Examination of the right-upper field reveals wheezing. Examination of the right-middle field reveals wheezing. Examination of the left-middle field reveals wheezing. Wheezing (faint, mild) present. No decreased breath sounds, rhonchi or rales.  Skin:    General: Skin is warm and dry.  Findings: No rash.  Neurological:     Mental Status: She is alert and oriented to person, place, and time.  Psychiatric:        Mood and Affect: Mood normal.        Behavior: Behavior normal.        Thought Content: Thought content normal.    Dg Chest 2 View  Result Date: 07/08/2019 CLINICAL DATA:  Cough, shortness of breath EXAM: CHEST - 2 VIEW COMPARISON:  01/28/2017 FINDINGS: The heart size and mediastinal contours are within normal limits. Both lungs are clear. The visualized skeletal structures are unremarkable. IMPRESSION: No acute abnormality of the lungs. Electronically Signed   By: Eddie Candle M.D.   On: 07/08/2019  09:50    Assessment and Plan :   1. Shortness of breath   2. Wheezing   3. Cough   4. Moderate persistent asthma without complication     Discussed the possibility of using prednisone and both the patient and I agreed to hold off for now.  I did discuss with patient using prednisone again if she continues to have symptoms, patient will call to request prednisone course if this is the case.  Patient is currently taking Advair but did better on Breo, unfortunately this is not covered by Medicaid.  Recommended patient start Singulair, maintain albuterol (does not need refills currently).  We will have her contact [place] for consult.  Patient has low risk factors for chest PE. Counseled patient on potential for adverse effects with medications prescribed/recommended today, ER and return-to-clinic precautions discussed, patient verbalized understanding.    [name], PA-C 07/08/19 1008

## 2019-07-20 ENCOUNTER — Encounter (HOSPITAL_COMMUNITY): Payer: Self-pay

## 2019-07-20 ENCOUNTER — Other Ambulatory Visit: Payer: Self-pay

## 2019-07-20 ENCOUNTER — Ambulatory Visit (INDEPENDENT_AMBULATORY_CARE_PROVIDER_SITE_OTHER): Payer: Managed Care, Other (non HMO) | Admitting: Internal Medicine

## 2019-07-20 ENCOUNTER — Encounter: Payer: Self-pay | Admitting: *Deleted

## 2019-07-20 ENCOUNTER — Encounter: Payer: Self-pay | Admitting: Internal Medicine

## 2019-07-20 ENCOUNTER — Ambulatory Visit (HOSPITAL_COMMUNITY)
Admission: EM | Admit: 2019-07-20 | Discharge: 2019-07-20 | Disposition: A | Discharge status: Home / Self Care | Payer: Managed Care, Other (non HMO) | Attending: Family Medicine | Admitting: Family Medicine

## 2019-07-20 DIAGNOSIS — J4540 Moderate persistent asthma, uncomplicated: Secondary | ICD-10-CM

## 2019-07-20 DIAGNOSIS — M79674 Pain in right toe(s): Secondary | ICD-10-CM

## 2019-07-20 MED ORDER — BREO ELLIPTA 100-25 MCG/INH IN AEPB: 1.0000 | INHALATION_SPRAY | Freq: Every day | RESPIRATORY_TRACT | 2 refills | Status: DC

## 2019-07-20 NOTE — ED Triage Notes (Signed)
Pt presents with complaints of injury to her right pinky toe. States that on Saturday she was helping get a tire out of a trunk. States her significant other's foot lifted up the nail. States that the nail is still present put it is "barely hanging on" and she is experiencing pain.

## 2019-07-20 NOTE — Patient Instructions (Addendum)
Plan A = Automatic = Always=    BREO x one click take 2 puffs each am   Plan B = Backup (to supplement plan A, not to replace it) Only use your albuterol inhaler as a rescue medication to be used if you can't catch your breath by resting or doing a relaxed purse lip breathing pattern.  - The less you use it, the better it will work when you need it. - Ok to use the inhaler up to 2 puffs  every 4 hours if you must but call for appointment if use goes up over your usual need - Don't leave home without it !!  (think of it like the spare tire for your car)   Plan C = Crisis (instead of Plan B but only if Plan B stops working) - only use your albuterol nebulizer if you first try Plan B and it fails to help > ok to use the nebulizer up to every 4 hours but if start needing it regularly call for immediate appointment   Please schedule a follow up office visit in 6 weeks, call sooner if needed with all medications /inhalers/ solutions in hand so we can verify exactly what you are taking. This includes all medications from all doctors and over the [place] separate them into two bags:  the ones you take automatically, no matter what, vs the ones you take just when you feel you need them "BAG #2 is UP TO YOU"  - this will really help [place] help you take your medications more effectively.

## 2019-07-20 NOTE — Progress Notes (Signed)
Taylor Wilkinson, female    DOB: [birth year],    MRN: [id number]   Brief patient profile:  5 yobf Quit smoking 06/2019  from [place] moved to [place] and fine until 2010 sob while living in house lots of mold and then moved to present residence 2012 "maint" on alb inhaler, albuterol nebulizer and still symptomatic at new house but finally   Much better while on Breo  But worse off Breo and  but better on advair June 2020 then started singulair 10 mg added Oct 2020 but still doe  referred to pulmonary clinic 07/20/2019 by Taylor Eagles PA for ?  copd     History of Present Illness  07/20/2019  Pulmonary/ 1st office eval/Taylor Wilkinson Chief Complaint  Patient presents with  . Pulmonary Consult    Referred by Taylor Eagles, PA.  Pt states she was dxed with bronchitis 10 yrs ago. She gets SOB when the weather changes. She is using her albuterol inhaler about once per day.  Dyspnea:  Takes neb prior to ex daily o/w says gets tight/ sob with limited ex tol / worse in cold weather Cough: none Sleep: fine now flat SABA use: typically in afternoon with activity    No obvious patterns in day to day or daytime variability or assoc excess/ purulent sputum or mucus plugs or hemoptysis or cp or chest tightness, subjective wheeze or overt sinus or hb symptoms.   Sleeping now  without nocturnal  or early am exacerbation  of respiratory  c/o's or need for noct saba. Also denies any obvious fluctuation of symptoms with weather or environmental changes or other aggravating or alleviating factors except as outlined above   No unusual exposure hx or h/o childhood pna/ asthma or knowledge of premature birth.  Current Allergies, Complete Past Medical History, Past Surgical History, Family History, and Social History were reviewed in [facility] record.  ROS  The following are not active complaints unless bolded Hoarseness, sore throat, dysphagia, dental problems, itching, sneezing,  nasal  congestion or discharge of excess mucus or purulent secretions, ear ache,   fever, chills, sweats, unintended wt loss or wt gain, classically pleuritic or exertional cp,  orthopnea pnd or arm/hand swelling  or leg swelling, presyncope, palpitations, abdominal pain, anorexia, nausea, vomiting, diarrhea  or change in bowel habits or change in bladder habits, change in stools or change in urine, dysuria, hematuria,  rash, arthralgias, visual complaints, headache, numbness, weakness or ataxia or problems with walking or coordination,  change in mood or  memory.              Past Medical History:  Diagnosis Date  . Asthma   . Bronchitis   . Miscarriage     Outpatient Medications Prior to Visit  Medication Sig Dispense Refill  . albuterol (PROVENTIL) (2.5 MG/3ML) 0.083% nebulizer solution Take 3 mLs (2.5 mg total) by nebulization every 6 (six) hours as needed for wheezing or shortness of breath. 75 mL 3  . albuterol (VENTOLIN HFA) 108 (90 Base) MCG/ACT inhaler INHALE 1-2 PUFFS INTO THE LUNGS EVERY 6 (SIX) HOURS AS NEEDED FOR WHEEZING OR SHORTNESS OF BREATH. 6.7 g 2  . Fluticasone-Salmeterol (ADVAIR DISKUS) 250-50 MCG/DOSE AEPB Inhale 1 puff into the lungs 2 (two) times daily. 1 each 3  . ketoconazole (NIZORAL) 2 % cream APPLY TO AFFECTED AREA EVERY DAY 15 g 0  . loratadine (CLARITIN) 10 MG tablet Take 1 tablet (10 mg total) by mouth daily. 30 tablet  11  . montelukast (SINGULAIR) 10 MG tablet Take 1 tablet (10 mg total) by mouth at bedtime. 90 tablet 0  . nystatin (MYCOSTATIN/NYSTOP) powder Apply topically 3 (three) times daily. 15 g 0  .       . terconazole (TERAZOL 3) 0.8 % vaginal cream Place 1 applicator vaginally at bedtime. 20 g 2      Objective:     BP 128/80 (BP Location: Left Arm, Cuff Size: Normal)   Pulse 86   Temp (!) 97.3 F (36.3 C) (Temporal)   Ht 5\' 6"  (1.676 m)   Wt 194 lb (88 kg)   SpO2 98% Comment: on RA  BMI 31.31 kg/m   SpO2: 98 %(on RA)   Amb pleasant bf  easily confused with names /purposes of resp rx    HEENT : pt wearing mask not removed for exam due to covid - 19 concerns.   NECK :  without JVD/Nodes/TM/ nl carotid upstrokes bilaterally   LUNGS: no acc muscle use,  Min barrel  contour chest wall with bilateral  slightly decreased bs s audible wheeze and  without cough on insp or exp maneuvers and min  Hyperresonant  to  percussion bilaterally     CV:  RRR  no s3 or murmur or increase in P2, and no edema   ABD:  soft and nontender with pos end  insp Hoover's  in the supine position. No bruits or organomegaly appreciated, bowel sounds nl  MS:   Nl gait/  ext warm without deformities, calf tenderness, cyanosis or clubbing No obvious joint restrictions   SKIN: warm and dry without lesions    NEURO:  alert, approp, nl sensorium with  no motor or cerebellar deficits apparent.           I personally reviewed images and agree with radiology impression as follows:  CXR:   07/08/19  No acute abnormality of the lungs.      Assessment   Moderate persistent asthma without complication Reports quit smoking oct 2020  - onset of symptoms 2010 improved on Breo/advair - Spirometry 02/13/18  FEV1 2.30 (98%)  Ratio 0.65    - 07/20/2019 Breo 100 x 6 weeks samples given then return with formulary   DDX of  difficult airways management almost all start with A and  include Adherence, Ace Inhibitors, Acid Reflux, Active Sinus Disease, Alpha 1 Antitripsin deficiency, Anxiety masquerading as Airways dz,  ABPA,  Allergy(esp in young), Aspiration (esp in elderly), Adverse effects of meds,  Active smoking or vaping, A bunch of PE's (a small clot burden can't cause this syndrome unless there is already severe underlying pulm or vascular dz with poor reserve) plus two Bs  = Bronchiectasis and Beta blocker use..and one C= CHF   Adherence is always the initial "prime suspect" and is a multilayered concern that requires a "trust but verify" approach in every  patient - starting with knowing how to use medications, especially inhalers, correctly, keeping up with refills and understanding the fundamental difference between maintenance and prns vs those medications only taken for a very short course and then stopped and not refilled. - Advised:  formulary restrictions will be an ongoing challenge for the forseable future and I would be happy to pick an alternative if the pt will first  provide me a list of them -  pt  will need to return here for training for any new device that is required eg dpi vs hfa vs respimat.   In  the meantime we can always provide samples so that the patient never runs out of any needed respiratory medications.   - limited insight into meds and difference between maint vs prn's - rule of two's reviewed:  If your breathing worsens or you need to use your rescue inhaler more than twice weekly or wake up more than twice a month with any respiratory symptoms or require more than two rescue inhalers per year, we need to see you right away because this means we're not controlling the underlying problem (inflammation) adequately.  Rescue inhalers (albuterol) do not control inflammation and overuse can lead to unnecessary and costly consequences.  They can make you feel better temporarily but eventually they will quit working effectively much as sleep aids lead to more insomnia if used regularly.   - return with all meds in hand using a trust but verify approach to confirm accurate Medication  Reconciliation The principal here is that until we are certain that the  patients are doing what we've asked, it makes no sense to ask them to do more.  - - The proper method of use, as well as anticipated side effects, of a dpi and hfa  inhaler were discussed and demonstrated to the patient.    ?active smoking > denies/ emphasized impt of maint off   ? Allergies  Esp mold > avoidance only for now plus continue singulair    ? Adverse effects of dpi >  did fine on Breo but not as well on advair which may be due to adverse effects of the contents of adair on the upper airway which have not been as problematic with breo in the 100 dose  ? Alpha one AT > very rare in AA    Total time devoted to counseling  > 50 % of initial 60 min office visit:  reviewed case with pt/ performed device teaching  using a teach back technique which also  extended face to face time for this visit (see above)  discussion of options/alternatives/ personally creating written customized instructions  in presence of pt  then going over those specific  Instructions directly with the pt including how to use all of the meds but in particular covering each new medication in detail and the difference between the maintenance= "automatic" meds and the prns using an action plan format for the latter (If this problem/symptom => do that organization reading Left to right).  Please see AVS from this visit for a full list of these instructions which I personally wrote for this pt and  are unique to this visit.       Christinia Gully, MD 07/20/2019

## 2019-07-21 ENCOUNTER — Encounter: Payer: Self-pay | Admitting: Internal Medicine

## 2019-07-21 NOTE — Assessment & Plan Note (Addendum)
Reports quit smoking oct 2020  - onset of symptoms 2010 improved on Breo/advair - Spirometry 02/13/18  FEV1 2.30 (98%)  Ratio 0.65    - 07/20/2019 Breo 100 x 6 weeks samples given then return with formulary   DDX of  difficult airways management almost all start with A and  include Adherence, Ace Inhibitors, Acid Reflux, Active Sinus Disease, Alpha 1 Antitripsin deficiency, Anxiety masquerading as Airways dz,  ABPA,  Allergy(esp in young), Aspiration (esp in elderly), Adverse effects of meds,  Active smoking or vaping, A bunch of PE's (a small clot burden can't cause this syndrome unless there is already severe underlying pulm or vascular dz with poor reserve) plus two Bs  = Bronchiectasis and Beta blocker use..and one C= CHF   Adherence is always the initial "prime suspect" and is a multilayered concern that requires a "trust but verify" approach in every patient - starting with knowing how to use medications, especially inhalers, correctly, keeping up with refills and understanding the fundamental difference between maintenance and prns vs those medications only taken for a very short course and then stopped and not refilled. - Advised:  formulary restrictions will be an ongoing challenge for the forseable future and I would be happy to pick an alternative if the pt will first  provide me a list of them -  pt  will need to return here for training for any new device that is required eg dpi vs hfa vs respimat.   In the meantime we can always provide samples so that the patient never runs out of any needed respiratory medications.   - limited insight into meds and difference between maint vs prn's - rule of two's reviewed:  If your breathing worsens or you need to use your rescue inhaler more than twice weekly or wake up more than twice a month with any respiratory symptoms or require more than two rescue inhalers per year, we need to see you right away because this means we're not controlling the underlying  problem (inflammation) adequately.  Rescue inhalers (albuterol) do not control inflammation and overuse can lead to unnecessary and costly consequences.  They can make you feel better temporarily but eventually they will quit working effectively much as sleep aids lead to more insomnia if used regularly.   - return with all meds in hand using a trust but verify approach to confirm accurate Medication  Reconciliation The principal here is that until we are certain that the  patients are doing what we've asked, it makes no sense to ask them to do more.  - - The proper method of use, as well as anticipated side effects, of a dpi and hfa  inhaler were discussed and demonstrated to the patient.    ?active smoking > denies/ emphasized impt of maint off   ? Allergies  Esp mold > avoidance only for now plus continue singulair    ? Adverse effects of dpi > did fine on Breo but not as well on advair which may be due to adverse effects of the contents of adair on the upper airway which have not been as problematic with breo in the 100 dose  ? Alpha one AT > very rare in AA    Total time devoted to counseling  > 50 % of initial 60 min office visit:  reviewed case with pt/ performed device teaching  using a teach back technique which also  extended face to face time for this visit (see above)  discussion of options/alternatives/ personally creating written customized instructions  in presence of pt  then going over those specific  Instructions directly with the pt including how to use all of the meds but in particular covering each new medication in detail and the difference between the maintenance= "automatic" meds and the prns using an action plan format for the latter (If this problem/symptom => do that organization reading Left to right).  Please see AVS from this visit for a full list of these instructions which I personally wrote for this pt and  are unique to this visit.

## 2019-07-22 NOTE — ED Provider Notes (Signed)
[facility]   [id number] 07/20/19 Arrival Time: [id number]  ASSESSMENT & PLAN:  1. Toe pain, right     No indication for imaging of foot/toes. No signs of infection. Placed in post-op shoe. WBAT. OTC analgesics if needed.  Recommend: Follow-up Information    Meccariello, Bernita Raisin, DO.   Specialty: Family Medicine Why: As needed. Contact information: [address] [place] [address] [phone]           Reviewed expectations re: course of current medical issues. Questions answered. Outlined signs and symptoms indicating need for more acute intervention. Patient verbalized understanding. After Visit Summary given.  SUBJECTIVE: History from: patient. Taylor Wilkinson is a 43 y.o. female who reports intermittent moderate pain of her right 5th toe around toenail; described as "really sore"; without radiation. Reports husbands foot hit her R 5th toenail and "bent it back"; was wearing flip flops at the time. She placed nail back in place. Has been weight bearing without difficulty. Symptoms have progressed to a point and plateaued since beginning. Aggravating factors: weight bearing. Alleviating factors: rest; not wearing a shoe. Associated symptoms: none reported. Extremity sensation changes or weakness: none. Self treatment: has not tried OTC therapies.  History of similar: no.  Past Surgical History:  Procedure Laterality Date  . NO PAST SURGERIES       ROS: As per HPI. All other systems negative.    OBJECTIVE:  Vitals:   07/20/19 1312  BP: 123/86  Pulse: 78  Resp: 16  Temp: 97.9 F (36.6 C)  TempSrc: Temporal  SpO2: 99%    General appearance: alert; no distress HEENT: [place]; AT Neck: supple with FROM Resp: unlabored respirations Extremities: . RLE: warm with well perfused appearance; well localized mild tenderness over right 5th toe around toenail; without gross deformities; swelling: none; bruising: none; nail is in place; 5th toe  ROM: normal with reported discomfort CV: brisk extremity capillary refill of RLE; 2+ DP pulse of RLE. Skin: warm and dry; no visible rashes Neurologic: gait normal; normal reflexes of RLE; normal sensation of RLE; normal strength of RLE Psychological: alert and cooperative; normal mood and affect   No Known Allergies  Past Medical History:  Diagnosis Date  . Asthma   . Bronchitis   . Miscarriage    Social History   Socioeconomic History  . Marital status: Married    Spouse name: Not on file  . Number of children: 4  . Years of education: Not on file  . Highest education level: Not on file  Occupational History  . Occupation: Ship broker  Social Needs  . Financial resource strain: Not on file  . Food insecurity    Worry: Not on file    Inability: Not on file  . Transportation needs    Medical: Not on file    Non-medical: Not on file  Tobacco Use  . Smoking status: Former Smoker    Packs/day: 0.50    Years: 16.00    Pack years: 8.00    Types: Cigars    Quit date: 06/29/2019    Years since quitting: 0.0  . Smokeless tobacco: Never Used  . Tobacco comment: 3 cigars per day  Substance and Sexual Activity  . Alcohol use: Yes    Comment: social  . Drug use: Yes    Types: Marijuana    Comment: marijuana--blunt a day--maybe 2 a day  . Sexual activity: Yes    Partners: Male    Birth control/protection: None  Lifestyle  .  Physical activity    Days per week: Not on file    Minutes per session: Not on file  . Stress: Not on file  Relationships  . Social Herbalist on phone: Not on file    Gets together: Not on file    Attends religious service: Not on file    Active member of club or organization: Not on file    Attends meetings of clubs or organizations: Not on file    Relationship status: Not on file  Other Topics Concern  . Not on file  Social History Narrative  . Not on file   Family History  Problem Relation Age of Onset  . Hypertension Mother    . Diabetes Mother   . Asthma Brother   . Healthy Father    Past Surgical History:  Procedure Laterality Date  . NO PAST SURGERIES        Vanessa Kick, MD 07/22/19 [phone]

## 2019-07-23 ENCOUNTER — Other Ambulatory Visit: Payer: Self-pay

## 2019-07-23 ENCOUNTER — Ambulatory Visit (HOSPITAL_COMMUNITY)
Admission: EM | Admit: 2019-07-23 | Discharge: 2019-07-23 | Disposition: A | Discharge status: Home / Self Care | Payer: Managed Care, Other (non HMO)

## 2019-07-23 ENCOUNTER — Encounter (HOSPITAL_COMMUNITY): Payer: Self-pay | Admitting: Emergency Medicine

## 2019-07-23 DIAGNOSIS — S99921D Unspecified injury of right foot, subsequent encounter: Secondary | ICD-10-CM

## 2019-07-23 NOTE — ED Provider Notes (Signed)
[place]    CSN: [id number] Arrival date & time: 07/23/19  0846      History   Chief Complaint Chief Complaint  Patient presents with  . Toe Pain    HPI Taylor Wilkinson is a 43 y.o. female.   Pt is a 43 year old female that presents with right small toenail pain. This started this am when the toenail got caught on her shoe when putting it on for work. Stated the toenail lifted slightly up. She was seen here 3 days ago for injury to the same toe. No bleeding, swelling, redness or drainage to the toe.   ROS per HPI    Toe Pain    Past Medical History:  Diagnosis Date  . Asthma   . Bronchitis   . Miscarriage     Patient Active Problem List   Diagnosis Date Noted  . Encounter for screening for cervical cancer  06/09/2019  . Candidal intertrigo 06/09/2019  . Moderate persistent asthma without complication 99991111  . Tobacco abuse 02/13/2018  . Shortness of breath 02/06/2018  . Chronic urticaria 02/06/2018  . Tachycardia 01/04/2013    Past Surgical History:  Procedure Laterality Date  . NO PAST SURGERIES      OB History    Gravida  6   Para  4   Term  4   Preterm      AB  1   Living  4     SAB  1   TAB      Ectopic      Multiple      Live Births               Home Medications    Prior to Admission medications   Medication Sig Start Date End Date Taking? Authorizing Provider  fluticasone furoate-vilanterol (BREO ELLIPTA) 100-25 MCG/INH AEPB Inhale 1 puff into the lungs daily. 07/20/19  Yes Tanda Rockers, MD  montelukast (SINGULAIR) 10 MG tablet Take 1 tablet (10 mg total) by mouth at bedtime. 07/08/19  Yes [name], PA-C  albuterol (PROVENTIL) (2.5 MG/3ML) 0.083% nebulizer solution Take 3 mLs (2.5 mg total) by nebulization every 6 (six) hours as needed for wheezing or shortness of breath. 12/01/18   Sherene Sires, DO  albuterol (VENTOLIN HFA) 108 (90 Base) MCG/ACT inhaler INHALE 1-2 PUFFS INTO THE LUNGS EVERY 6  (SIX) HOURS AS NEEDED FOR WHEEZING OR SHORTNESS OF BREATH. 06/29/19   Meccariello, Bernita Raisin, DO  loratadine (CLARITIN) 10 MG tablet Take 1 tablet (10 mg total) by mouth daily. 06/28/19 07/23/19  [name], NP    Family History Family History  Problem Relation Age of Onset  . Hypertension Mother   . Diabetes Mother   . Asthma Brother   . Healthy Father     Social History Social History   Tobacco Use  . Smoking status: Former Smoker    Packs/day: 0.50    Years: 16.00    Pack years: 8.00    Types: Cigars    Quit date: 06/29/2019    Years since quitting: 0.0  . Smokeless tobacco: Never Used  . Tobacco comment: 3 cigars per day  Substance Use Topics  . Alcohol use: Yes    Comment: social  . Drug use: Yes    Types: Marijuana    Comment: marijuana--blunt a day--maybe 2 a day     Allergies   Patient has no known allergies.   Review of Systems Review of Systems  Physical Exam Triage Vital Signs ED Triage Vitals  Enc Vitals Group     BP 07/23/19 0932 120/89     Pulse Rate 07/23/19 0932 80     Resp 07/23/19 0932 18     Temp 07/23/19 0932 97.6 F (36.4 C)     Temp Source 07/23/19 0932 Oral     SpO2 07/23/19 0932 97 %     Weight --      Height --      Head Circumference --      Peak Flow --      Pain Score 07/23/19 0927 6     Pain Loc --      Pain Edu? --      Excl. in [place]? --    No data found.  Updated Vital Signs BP 120/89 (BP Location: Left Arm)   Pulse 80   Temp 97.6 F (36.4 C) (Oral)   Resp 18   LMP 07/20/2019   SpO2 97%   Visual Acuity Right Eye Distance:   Left Eye Distance:   Bilateral Distance:    Right Eye Near:   Left Eye Near:    Bilateral Near:     Physical Exam Vitals signs and nursing note reviewed.  Constitutional:      General: She is not in acute distress.    Appearance: Normal appearance. She is not ill-appearing, toxic-appearing or diaphoretic.  HENT:     Head: Normocephalic.     Nose: Nose normal.      Mouth/Throat:     Pharynx: Oropharynx is clear.  Eyes:     Conjunctiva/sclera: Conjunctivae normal.  Neck:     Musculoskeletal: Normal range of motion.  Pulmonary:     Effort: Pulmonary effort is normal.  Musculoskeletal: Normal range of motion.     Comments: Right small toe and toenail appear normal Toenail still intact and well attached    Skin:    General: Skin is warm and dry.     Findings: No rash.  Neurological:     Mental Status: She is alert.  Psychiatric:        Mood and Affect: Mood normal.      UC Treatments / Results  Labs (all labs ordered are listed, but only abnormal results are displayed) Labs Reviewed - No data to display  EKG   Radiology No results found.  Procedures Procedures (including critical care time)  Medications Ordered in UC Medications - No data to display  Initial Impression / Assessment and Plan / UC Course  I have reviewed the triage vital signs and the nursing notes.  Pertinent labs & imaging results that were available during my care of the patient were reviewed by me and considered in my medical decision making (see chart for details).     Toe injury- toenail still intact without bleeding or infection.  Keep wrapped when wearing shoes.  Follow up as needed for continued or worsening symptoms  Final Clinical Impressions(s) / UC Diagnoses   Final diagnoses:  Toe injury, right, subsequent encounter     Discharge Instructions     The toenail is well attached. Keep a bandage on  it when wearing shoes.  Follow up as needed for continued or worsening symptoms     ED Prescriptions    None     PDMP not reviewed this encounter.   Loura Halt A, NP 07/23/19 1026

## 2019-07-23 NOTE — Discharge Instructions (Signed)
The toenail is well attached. Keep a bandage on  it when wearing shoes.  Follow up as needed for continued or worsening symptoms

## 2019-07-23 NOTE — ED Triage Notes (Signed)
Seen 11/9 for the same.  Patient reports getting ready for work today, putting her shoe on right foot, felt nail of little toe lift up, again.

## 2019-07-30 ENCOUNTER — Other Ambulatory Visit: Payer: Self-pay

## 2019-07-31 NOTE — Telephone Encounter (Signed)
Unsure reason for this medication refill.  Would like patient to be seen if she needs this medication as I have never seen her for this problem and it is not on her current medication list.

## 2019-08-03 NOTE — Telephone Encounter (Signed)
LVM to call office back to inform pt of below and to assist in getting an appointment scheduled to discuss this medication.Taylor Wilkinson, CMA

## 2019-08-04 NOTE — Telephone Encounter (Signed)
Appt made for tomorrow afternoon.  The medication is for the rash between her breast, but since it is "worse" she is agreeable to coming in. Christen Bame, CMA

## 2019-08-05 ENCOUNTER — Ambulatory Visit (INDEPENDENT_AMBULATORY_CARE_PROVIDER_SITE_OTHER): Payer: Managed Care, Other (non HMO) | Admitting: Family Medicine

## 2019-08-05 ENCOUNTER — Other Ambulatory Visit: Payer: Self-pay

## 2019-08-05 VITALS — BP 114/78 | HR 104 | Wt 196.2 lb

## 2019-08-05 DIAGNOSIS — R21 Rash and other nonspecific skin eruption: Secondary | ICD-10-CM | POA: Diagnosis not present

## 2019-08-05 MED ORDER — TRIAMCINOLONE ACETONIDE 0.1 % EX OINT: 1.0000 "application " | TOPICAL_OINTMENT | Freq: Two times a day (BID) | CUTANEOUS | 1 refills | Status: DC

## 2019-08-05 NOTE — Progress Notes (Signed)
     Subjective: Chief Complaint  Patient presents with  . Rash    inbetween breast     HPI: Taylor Wilkinson is a 43 y.o. presenting to clinic today to discuss the following:  1 Rash between Breasts Previously treated for candidal intertrigo.  Use ketoconazole with some improvement.  In the last month, feels like it has changed and is more in the center of her chest.   Very itchy.  Has used nystatin powder and helped at first as well, but now isn't.  No fevers.       ROS noted in HPI. Chief complaint noted.  Other Pertinent PMH: Asthma  Past Medical, Surgical, Social, and Family History Reviewed & Updated per EMR.      Social History   Tobacco Use  Smoking Status Former Smoker  . Packs/day: 0.50  . Years: 16.00  . Pack years: 8.00  . Types: Cigars  . Quit date: 06/29/2019  . Years since quitting: 0.1  Smokeless Tobacco Never Used  Tobacco Comment   3 cigars per day   Smoking status noted.    Objective: BP 114/78   Pulse (!) 104   Wt 196 lb 3.2 oz (89 kg)   LMP 07/20/2019   SpO2 96%   BMI 31.67 kg/m  Vitals and nursing notes reviewed  Physical Exam:  General: 43 y.o. female in NAD Lungs: Breathing comfortably Skin: Hypopigmented scaling plaque with central chest between breasts, few excoriations around outer edge, no obvious redness, small papules underneath of breasts       No results found for this or any previous visit (from the past 72 hour(s)).  Assessment/Plan:  Rash and nonspecific skin eruption Given that she has failed treatment with ketoconazole and nystatin, question if this is truly a yeast infection.  Rash also appears different from previously and does not have obvious satellite lesions were obvious in appearance of yeast as it did previously.  Given her history of asthma, and plaque and scaling appearance, could consider atopic dermatitis, although this would be a rather rare place for this to occur.  Will trial Kenalog cream and bring  patient back in 2 weeks to ensure that there is improvement.  If no improvement, would consider biopsy or referral to dermatology.     PATIENT EDUCATION PROVIDED: See AVS    Diagnosis and plan along with any newly prescribed medication(s) were discussed in detail with this patient today. The patient verbalized understanding and agreed with the plan. Patient advised if symptoms worsen return to clinic or ER.     No orders of the defined types were placed in this encounter.   Meds ordered this encounter  Medications  . triamcinolone ointment (KENALOG) 0.1 %    Sig: Apply 1 application topically 2 (two) times daily.    Dispense:  30 g    Refill:  [place], DO 08/05/2019, 4:52 PM PGY-2 [place]

## 2019-08-05 NOTE — Assessment & Plan Note (Signed)
Given that she has failed treatment with ketoconazole and nystatin, question if this is truly a yeast infection.  Rash also appears different from previously and does not have obvious satellite lesions were obvious in appearance of yeast as it did previously.  Given her history of asthma, and plaque and scaling appearance, could consider atopic dermatitis, although this would be a rather rare place for this to occur.  Will trial Kenalog cream and bring patient back in 2 weeks to ensure that there is improvement.  If no improvement, would consider biopsy or referral to dermatology.

## 2019-08-05 NOTE — Patient Instructions (Signed)
Thank you for coming to see me today. It was a pleasure. Today we talked about:   Your rash: The rash does not improve with antifungals and it looks like it may be a type of eczema, we will treat with steroids.  Please come back in 2 weeks so that we can ensure that this is improving and see if there is anything else that we need to do.  Do not use the steroid cream for more than 2 weeks consecutively.  Please follow-up with me in 2 weeks.  If you have any questions or concerns, please do not hesitate to call the office at [phone].  Best,   Arizona Constable, DO

## 2019-08-19 ENCOUNTER — Ambulatory Visit: Payer: Managed Care, Other (non HMO) | Admitting: Family Medicine

## 2019-08-31 ENCOUNTER — Ambulatory Visit (INDEPENDENT_AMBULATORY_CARE_PROVIDER_SITE_OTHER): Payer: Managed Care, Other (non HMO) | Admitting: Internal Medicine

## 2019-08-31 ENCOUNTER — Other Ambulatory Visit: Payer: Self-pay

## 2019-08-31 ENCOUNTER — Telehealth: Payer: Self-pay | Admitting: Internal Medicine

## 2019-08-31 ENCOUNTER — Encounter: Payer: Self-pay | Admitting: Internal Medicine

## 2019-08-31 DIAGNOSIS — J4540 Moderate persistent asthma, uncomplicated: Secondary | ICD-10-CM | POA: Diagnosis not present

## 2019-08-31 MED ORDER — BREO ELLIPTA 100-25 MCG/INH IN AEPB: 1.0000 | INHALATION_SPRAY | Freq: Every day | RESPIRATORY_TRACT | 11 refills | Status: DC

## 2019-08-31 MED ORDER — BREO ELLIPTA 100-25 MCG/INH IN AEPB: 1.0000 | INHALATION_SPRAY | Freq: Every day | RESPIRATORY_TRACT | 0 refills | Status: DC

## 2019-08-31 NOTE — Patient Instructions (Signed)
No change in medications  - if you have issues with insurance, we will need to see your Drug Formulary for cheaper alternatives   Breathe clean air    Please schedule a follow up visit in 6  months but call sooner if needed with PFTs on return

## 2019-08-31 NOTE — Progress Notes (Signed)
Taylor Wilkinson, female    DOB: 1976/08/25,    MRN: [id number]   Brief patient profile:  59 yobf  Quit smoking 06/2019  from [place] moved to [place] and fine until 2010 sob while living in house lots of mold and then moved to present residence 2012 "maint" on alb inhaler, albuterol nebulizer and still symptomatic at new house but finally   Much better while on Breo  But worse off Breo and  but better on advair June 2020 then started singulair 10 mg added Oct 2020 but still doe  referred to pulmonary clinic 07/20/2019 by Taylor Eagles PA for ?  copd     History of Present Illness  07/20/2019  Pulmonary/ 1st office eval/Taylor Wilkinson Chief Complaint  Patient presents with  . Pulmonary Consult    Referred by Taylor Eagles, PA.  Pt states she was dxed with bronchitis 10 yrs ago. She gets SOB when the weather changes. She is using her albuterol inhaler about once per day.  Dyspnea:  Takes neb prior to ex daily o/w says gets tight/ sob with limited ex tol / worse in cold weather Cough: none Sleep: fine now flat SABA use: typically in afternoon with activity  rec Plan A = Automatic = Always=    BREO x one click take 2 puffs each am  Plan B = Backup (to supplement plan A, not to replace it) Only use your albuterol inhaler as a rescue medication Plan C = Crisis (instead of Plan B but only if Plan B stops working) - only use your albuterol nebulizer if you first try Plan B Please schedule a follow up office visit in 6 weeks, call sooner if needed with all medications /inhalers/ solutions in hand     08/31/2019  f/u ov/Taylor Wilkinson re:  Copd/cb still smoking mj on Breo 100 Chief Complaint  Patient presents with  . Follow-up    Patient reports that she is doing great since starting on the Breo.   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   Sleeping  without nocturnal  or early am exacerbation  of respiratory  c/o's or need for noct saba. Also denies any obvious fluctuation of symptoms with weather or environmental changes or other aggravating or alleviating factors except as outlined above   No unusual exposure hx or h/o childhood pna/ asthma or knowledge of premature birth.  Current Allergies, Complete Past Medical History, Past Surgical History, Family History, and Social History were reviewed in [employer] record.  ROS  The following are not active complaints unless bolded Hoarseness, sore throat, dysphagia, dental problems, itching, sneezing,  nasal congestion or discharge of excess mucus or purulent secretions, ear ache,   fever, chills, sweats, unintended wt loss or wt gain, classically pleuritic or exertional cp,  orthopnea pnd or arm/hand swelling  or leg swelling, presyncope, palpitations, abdominal pain, anorexia, nausea, vomiting, diarrhea  or change in bowel habits or change in bladder habits, change in stools or change in urine, dysuria, hematuria,  rash, arthralgias, visual complaints, headache, numbness, weakness or ataxia or problems with walking or coordination,  change in mood or  memory.  Current Meds  Medication Sig  . albuterol (PROVENTIL) (2.5 MG/3ML) 0.083% nebulizer solution Take 3 mLs (2.5 mg total) by nebulization every 6 (six) hours as needed for wheezing or shortness of breath.  Marland Kitchen albuterol (VENTOLIN HFA) 108 (90 Base) MCG/ACT inhaler INHALE 1-2 PUFFS INTO THE LUNGS EVERY 6 (SIX) HOURS AS NEEDED FOR WHEEZING OR SHORTNESS OF BREATH.  . fluticasone furoate-vilanterol (BREO ELLIPTA) 100-25 MCG/INH AEPB Inhale 1 puff into the lungs daily.  Marland Kitchen triamcinolone ointment (KENALOG) 0.1 % Apply 1 application topically 2 (two) times daily.  . [DISCONTINUED]  fluticasone furoate-vilanterol (BREO ELLIPTA) 100-25 MCG/INH AEPB Inhale 1 puff into the lungs daily.              Past Medical History:  Diagnosis Date  . Asthma   . Bronchitis   . Miscarriage       Objective:    amb pleasant bf nad   Wt Readings from Last 3 Encounters:  08/31/19 197 lb 3.2 oz (89.4 kg)  08/05/19 196 lb 3.2 oz (89 kg)  07/20/19 194 lb (88 kg)     Vital signs reviewed - Note on arrival 02 sats  100% on RA       HEENT : pt wearing mask not removed for exam due to covid - 19 concerns.   NECK :  without JVD/Nodes/TM/ nl carotid upstrokes bilaterally   LUNGS: no acc muscle use,  Min barrel  contour chest wall with bilateral  slightly decreased bs s audible wheeze and  without cough on insp or exp maneuvers and min  Hyperresonant  to  percussion bilaterally     CV:  RRR  no s3 or murmur or increase in P2, and no edema   ABD:  soft and nontender with pos end  insp Hoover's  in the supine position. No bruits or organomegaly appreciated, bowel sounds nl  MS:   Nl gait/  ext warm without deformities, calf tenderness, cyanosis or clubbing No obvious joint restrictions   SKIN: warm and dry without lesions    NEURO:  alert, approp, nl sensorium with  no motor or cerebellar deficits apparent.         Assessment

## 2019-08-31 NOTE — Telephone Encounter (Signed)
Called [facility] to start PA. PA has been approved from 08/31/19-08/25/20. PA# [id number]  Pharmacy is aware of approval.   Spoke with patient. She is aware of approval. Advised her to let us know if she continued to have issues with her RX, she verbalized understanding.

## 2019-09-01 ENCOUNTER — Encounter: Payer: Self-pay | Admitting: Internal Medicine

## 2019-09-01 NOTE — Assessment & Plan Note (Addendum)
Reports quit smoking oct 2020 / still using MJ - onset of symptoms 2010 improved on Breo/advair - Spirometry 02/13/18  FEV1 2.30 (98%)  Ratio 0.65    - 07/20/2019 Breo 100 x 6 weeks samples given then return with formulary > much better 08/31/2019 but no formulary in hand   Despite still smoking MJ all  goals of chronic asthma control met including optimal function and elimination of symptoms with minimal need for rescue therapy.  Contingencies discussed in full including contacting this office immediately if not controlling the symptoms using the rule of two's.      Advised:  formulary restrictions will be an ongoing challenge for the forseable future and I would be happy to pick an alternative if the pt will first  provide me a list of them -  pt  will need to return here for training for any new device that is required eg dpi vs hfa vs respimat.    In the meantime we can always provide samples so that the patient never runs out of any needed respiratory medications.   Pt informed of the seriousness of COVID 19 infection as a direct risk to their health  and safey and to those of their loved ones and should continue to wear facemask in public and minimize exposure to public locations but especially avoid any area or activity where non-close contacts are not observing distancing or wearing an appropriate face mask>>  Strongly rec take vaccine when offered.   >>> f/u in 6 m, call sooner if needed with pfts on return    I had an extended discussion with the patient reviewing all relevant studies completed to date and  lasting 15 to 20 minutes of a 25 minute visit    Each maintenance medication was reviewed in detail including most importantly the difference between maintenance and prns and under what circumstances the prns are to be triggered using an action plan format that is not reflected in the computer generated alphabetically organized AVS.     Please see AVS for specific instructions  unique to this visit that I personally wrote and verbalized to the the pt in detail and then reviewed with pt  by my nurse highlighting any  changes in therapy recommended at today's visit to their plan of care.

## 2019-10-11 ENCOUNTER — Encounter (HOSPITAL_COMMUNITY): Payer: Self-pay

## 2019-10-11 ENCOUNTER — Ambulatory Visit (HOSPITAL_COMMUNITY)
Admission: EM | Admit: 2019-10-11 | Discharge: 2019-10-11 | Disposition: A | Discharge status: Home / Self Care | Payer: Managed Care, Other (non HMO) | Attending: Family Medicine | Admitting: Family Medicine

## 2019-10-11 DIAGNOSIS — J4540 Moderate persistent asthma, uncomplicated: Secondary | ICD-10-CM

## 2019-10-11 DIAGNOSIS — M7918 Myalgia, other site: Secondary | ICD-10-CM | POA: Diagnosis not present

## 2019-10-11 MED ORDER — IBUPROFEN 800 MG PO TABS: 800.0000 mg | ORAL_TABLET | Freq: Three times a day (TID) | ORAL | 0 refills | Status: DC

## 2019-10-11 MED ORDER — HYDROCODONE-ACETAMINOPHEN 7.5-325 MG PO TABS: 1.0000 | ORAL_TABLET | Freq: Four times a day (QID) | ORAL | 0 refills | Status: DC | PRN

## 2019-10-11 MED ORDER — TIZANIDINE HCL 4 MG PO TABS: 4.0000 mg | ORAL_TABLET | Freq: Four times a day (QID) | ORAL | 0 refills | Status: DC | PRN

## 2019-10-11 NOTE — ED Provider Notes (Signed)
[facility]    CSN: [id number] Arrival date & time: 10/11/19  1233      History   Chief Complaint Chief Complaint  Patient presents with  . Shoulder Pain    HPI Taylor Wilkinson is a 44 y.o. female.   HPI  Patient is here with pain in her left shoulder.  She describes waking up with this pain Friday.  No activity on Thursday that might of strained a muscle.  No fall.  No injury.  No prior problems with her neck or left shoulder.  The pain goes from the back of her head out to the shoulder and down the edge of the shoulder blade.  The muscle feels quite tight and sore.  No headache.  No numbness or weakness into the arms.  She has been taking ibuprofen.  She states that this is not strong enough.  She has been taking 800 mg tablets.  Past Medical History:  Diagnosis Date  . Asthma   . Bronchitis   . Miscarriage     Patient Active Problem List   Diagnosis Date Noted  . Rash and nonspecific skin eruption 08/05/2019  . Encounter for screening for cervical cancer  06/09/2019  . Candidal intertrigo 06/09/2019  . Moderate persistent asthma without complication 99991111  . Tobacco abuse 02/13/2018  . Shortness of breath 02/06/2018  . Chronic urticaria 02/06/2018  . Tachycardia 01/04/2013    Past Surgical History:  Procedure Laterality Date  . NO PAST SURGERIES      OB History    Gravida  6   Para  4   Term  4   Preterm      AB  1   Living  4     SAB  1   TAB      Ectopic      Multiple      Live Births               Home Medications    Prior to Admission medications   Medication Sig Start Date End Date Taking? Authorizing Provider  fluticasone furoate-vilanterol (BREO ELLIPTA) 100-25 MCG/INH AEPB Inhale 1 puff into the lungs daily. 08/31/19   Tanda Rockers, MD  HYDROcodone-acetaminophen (NORCO) 7.5-325 MG tablet Take 1 tablet by mouth every 6 (six) hours as needed for severe pain. 10/11/19   Raylene Everts, MD  ibuprofen  (ADVIL) 800 MG tablet Take 1 tablet (800 mg total) by mouth 3 (three) times daily. 10/11/19   Raylene Everts, MD  tiZANidine (ZANAFLEX) 4 MG tablet Take 1-2 tablets (4-8 mg total) by mouth every 6 (six) hours as needed for muscle spasms. 10/11/19   Raylene Everts, MD  albuterol (VENTOLIN HFA) 108 (90 Base) MCG/ACT inhaler INHALE 1-2 PUFFS INTO THE LUNGS EVERY 6 (SIX) HOURS AS NEEDED FOR WHEEZING OR SHORTNESS OF BREATH. 06/29/19 10/11/19  Meccariello, Bernita Raisin, DO  loratadine (CLARITIN) 10 MG tablet Take 1 tablet (10 mg total) by mouth daily. 06/28/19 07/23/19  [name], NP  montelukast (SINGULAIR) 10 MG tablet Take 1 tablet (10 mg total) by mouth at bedtime. Patient not taking: Reported on 08/31/2019 07/08/19 10/11/19  [name], PA-C    Family History Family History  Problem Relation Age of Onset  . Hypertension Mother   . Diabetes Mother   . Asthma Brother   . Healthy Father     Social History Social History   Tobacco Use  . Smoking status: Former Smoker  Packs/day: 0.50    Years: 16.00    Pack years: 8.00    Types: Cigars    Quit date: 06/29/2019    Years since quitting: 0.2  . Smokeless tobacco: Never Used  . Tobacco comment: 3 cigars per day  Substance Use Topics  . Alcohol use: Yes    Comment: social  . Drug use: Yes    Types: Marijuana    Comment: marijuana--blunt a day--maybe 2 a day     Allergies   Patient has no known allergies.   Review of Systems Review of Systems  Musculoskeletal: Positive for myalgias, neck pain and neck stiffness.  Neurological: Negative for weakness and numbness.  Psychiatric/Behavioral: Positive for sleep disturbance.     Physical Exam Triage Vital Signs ED Triage Vitals  Enc Vitals Group     BP 10/11/19 1246 111/83     Pulse Rate 10/11/19 1246 90     Resp 10/11/19 1246 17     Temp 10/11/19 1246 98.4 F (36.9 C)     Temp Source 10/11/19 1246 Oral     SpO2 10/11/19 1246 98 %     Weight --      Height --        Head Circumference --      Peak Flow --      Pain Score 10/11/19 1242 10     Pain Loc --      Pain Edu? --      Excl. in [place]? --    No data found.  Updated Vital Signs BP 111/83 (BP Location: Right Arm)   Pulse 90   Temp 98.4 F (36.9 C) (Oral)   Resp 17   LMP  (Within Weeks) Comment: 1 week  SpO2 98%      Physical Exam Constitutional:      General: She is in acute distress.     Appearance: She is well-developed and normal weight.     Comments: Appears uncomfortable secondary to pain.  Guarded movements.  Stiff posture  HENT:     Head: Normocephalic and atraumatic.     Mouth/Throat:     Comments: Mask in place Eyes:     Conjunctiva/sclera: Conjunctivae normal.     Pupils: Pupils are equal, round, and reactive to light.  Neck:   Cardiovascular:     Rate and Rhythm: Normal rate.  Pulmonary:     Effort: Pulmonary effort is normal. No respiratory distress.  Musculoskeletal:        General: Normal range of motion.     Cervical back: Normal range of motion.     Comments: With slow range of motion of neck.  No pain with upper extremity movement.  Strength sensation range of motion reflexes normal in both upper extremities  Skin:    General: Skin is warm and dry.  Neurological:     Mental Status: She is alert. Mental status is at baseline.  Psychiatric:        Mood and Affect: Mood normal.        Behavior: Behavior normal.      UC Treatments / Results  Labs (all labs ordered are listed, but only abnormal results are displayed) Labs Reviewed - No data to display  EKG   Radiology No results found.  Procedures Procedures (including critical care time)  Medications Ordered in UC Medications - No data to display  Initial Impression / Assessment and Plan / UC Course  I have reviewed the triage vital signs and the  nursing notes.  Pertinent labs & imaging results that were available during my care of the patient were reviewed by me and considered in my  medical decision making (see chart for details).     Reviewed this is a muscular injury.  May be postural, overuse, trauma.  Recommend conservative management.  Return if worse instead of better. Final Clinical Impressions(s) / UC Diagnoses   Final diagnoses:  Musculoskeletal pain     Discharge Instructions     Take ibuprofen 3 times a day Take the hydrocodone in addition if needed Do not drive on the hydrocodone Take the muscle relaxer as needed This is good to take at night Ice or heat to area   ED Prescriptions    Medication Sig Dispense Auth. Provider   HYDROcodone-acetaminophen (NORCO) 7.5-325 MG tablet Take 1 tablet by mouth every 6 (six) hours as needed for severe pain. 10 tablet Raylene Everts, MD   tiZANidine (ZANAFLEX) 4 MG tablet Take 1-2 tablets (4-8 mg total) by mouth every 6 (six) hours as needed for muscle spasms. 21 tablet Raylene Everts, MD   ibuprofen (ADVIL) 800 MG tablet Take 1 tablet (800 mg total) by mouth 3 (three) times daily. 21 tablet Raylene Everts, MD     I have reviewed the PDMP during this encounter.   Raylene Everts, MD 10/11/19 1420

## 2019-10-11 NOTE — ED Triage Notes (Signed)
Pt reports she started having left shoulder pain and left scapular pain x 3 days. Pt is taking ibuprofen without relief.

## 2019-10-11 NOTE — Discharge Instructions (Signed)
Take ibuprofen 3 times a day Take the hydrocodone in addition if needed Do not drive on the hydrocodone Take the muscle relaxer as needed This is good to take at night Ice or heat to area

## 2019-10-14 ENCOUNTER — Encounter (HOSPITAL_COMMUNITY): Payer: Self-pay

## 2019-10-14 ENCOUNTER — Ambulatory Visit (HOSPITAL_COMMUNITY)
Admission: EM | Admit: 2019-10-14 | Discharge: 2019-10-14 | Disposition: A | Discharge status: Home / Self Care | Payer: Managed Care, Other (non HMO) | Attending: Family Medicine | Admitting: Family Medicine

## 2019-10-14 ENCOUNTER — Other Ambulatory Visit: Payer: Self-pay

## 2019-10-14 DIAGNOSIS — M62838 Other muscle spasm: Secondary | ICD-10-CM

## 2019-10-14 NOTE — ED Triage Notes (Signed)
Pt presents to UC for follow up. Pt reports having left shoulder pain and left scapular pain x 1 week. Pt reports she can not go back to work as she needs to lift 65 lbs at work.  Pt reports she do not like the hydrocodone, makes her drowsy.

## 2019-10-14 NOTE — Discharge Instructions (Signed)
Heat, massage, stretching as able.  Continue with ibuprofen regularly as prescribed.  Muscle relaxer as needed as prescribed.  Light and regular activity as tolerated. Limit lifting for another 3 days to allow for further improvement.  Follow up with your primary care provider as needed if symptoms persist.

## 2019-10-14 NOTE — ED Provider Notes (Signed)
[facility]    CSN: [id number] Arrival date & time: 10/14/19  [id number]      History   Chief Complaint Chief Complaint  Patient presents with  . Follow-up  . Shoulder Pain    HPI Taylor Wilkinson is a 44 y.o. female.   [facility] Taylor Wilkinson presents with complaints of persistent left neck/ posterior shoulder pain. Started 1/29 initially. No known trigger or injury. She presented and was seen here 1/31, was provided with nsaids, muscle relaxers and vicodin. There has been some mild relief but pain persists. 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 She is left handed. No radiation of pain to left arm, no numbness tingling or weakness to arm or hand. No headache. Pain with turning of the head, worse if turn left. Denies any previous similar. Has been taking ibuprofen and muscle relaxer, stopped vicodin due to side effects.   ROS per HPI, negative if not otherwise mentioned.      Past Medical History:  Diagnosis Date  . Asthma   . Bronchitis   . Miscarriage     Patient Active Problem List   Diagnosis Date Noted  . Rash and nonspecific skin eruption 08/05/2019  . Encounter for screening for cervical cancer  06/09/2019  . Candidal intertrigo 06/09/2019  . Moderate persistent asthma without complication 99991111  . Tobacco abuse 02/13/2018  . Shortness of breath 02/06/2018  . Chronic urticaria 02/06/2018  . Tachycardia 01/04/2013    Past Surgical History:  Procedure Laterality Date  . NO PAST SURGERIES      OB History    Gravida  6   Para  4   Term  4   Preterm      AB  1   Living  4     SAB  1   TAB      Ectopic      Multiple      Live Births               Home Medications    Prior to Admission medications   Medication Sig Start Date End Date Taking? Authorizing Provider  fluticasone furoate-vilanterol (BREO ELLIPTA) 100-25 MCG/INH AEPB Inhale 1 puff  into the lungs daily. 08/31/19   Tanda Rockers, MD  HYDROcodone-acetaminophen (NORCO) 7.5-325 MG tablet Take 1 tablet by mouth every 6 (six) hours as needed for severe pain. 10/11/19   Raylene Everts, MD  ibuprofen (ADVIL) 800 MG tablet Take 1 tablet (800 mg total) by mouth 3 (three) times daily. 10/11/19   Raylene Everts, MD  tiZANidine (ZANAFLEX) 4 MG tablet Take 1-2 tablets (4-8 mg total) by mouth every 6 (six) hours as needed for muscle spasms. 10/11/19   Raylene Everts, MD  albuterol (VENTOLIN HFA) 108 (90 Base) MCG/ACT inhaler INHALE 1-2 PUFFS INTO THE LUNGS EVERY 6 (SIX) HOURS AS NEEDED FOR WHEEZING OR SHORTNESS OF BREATH. 06/29/19 10/11/19  Meccariello, Bernita Raisin, DO  loratadine (CLARITIN) 10 MG tablet Take 1 tablet (10 mg total) by mouth daily. 06/28/19 07/23/19  [name], NP  montelukast (SINGULAIR) 10 MG tablet Take 1 tablet (10 mg total) by mouth at bedtime. Patient not taking: Reported on 08/31/2019 07/08/19 10/11/19  [name], PA-C    Family History Family History  Problem Relation Age of Onset  . Hypertension Mother   . Diabetes Mother   . Asthma Brother   .  Healthy Father     Social History Social History   Tobacco Use  . Smoking status: Former Smoker    Packs/day: 0.50    Years: 16.00    Pack years: 8.00    Types: Cigars    Quit date: 06/29/2019    Years since quitting: 0.2  . Smokeless tobacco: Never Used  . Tobacco comment: 3 cigars per day  Substance Use Topics  . Alcohol use: Yes    Comment: social  . Drug use: Yes    Types: Marijuana    Comment: marijuana--blunt a day--maybe 2 a day     Allergies   Patient has no known allergies.   Review of Systems Review of Systems   Physical Exam Triage Vital Signs ED Triage Vitals  Enc Vitals Group     BP 10/14/19 0918 (!) 142/91     Pulse Rate 10/14/19 0918 75     Resp 10/14/19 0918 17     Temp 10/14/19 0918 98.4 F (36.9 C)     Temp Source 10/14/19 0918 Oral     SpO2 10/14/19  0918 100 %     Weight --      Height --      Head Circumference --      Peak Flow --      Pain Score 10/14/19 0917 7     Pain Loc --      Pain Edu? --      Excl. in [place]? --    No data found.  Updated Vital Signs BP (!) 142/91 (BP Location: Right Arm)   Pulse 75   Temp 98.4 F (36.9 C) (Oral)   Resp 17   SpO2 100%    Physical Exam Constitutional:      General: She is not in acute distress.    Appearance: She is well-developed.  Cardiovascular:     Rate and Rhythm: Normal rate.  Pulmonary:     Effort: Pulmonary effort is normal.  Musculoskeletal:     Cervical back: Tenderness present. No swelling, deformity, lacerations, rigidity or bony tenderness. Pain with movement present. Decreased range of motion.       Back:     Comments: Left trapezius with tenderness; no spinous process or bony tenderness; noted full rom but hesitation and endorsement of pain worse with turning left and with flexion of the neck; full ROM of arms; strength equal bilaterally; gross sensation intact to upper extremities   Skin:    General: Skin is warm and dry.  Neurological:     Mental Status: She is alert and oriented to person, place, and time.      UC Treatments / Results  Labs (all labs ordered are listed, but only abnormal results are displayed) Labs Reviewed - No data to display  EKG   Radiology No results found.  Procedures Procedures (including critical care time)  Medications Ordered in UC Medications - No data to display  Initial Impression / Assessment and Plan / UC Course  I have reviewed the triage vital signs and the nursing notes.  Pertinent labs & imaging results that were available during my care of the patient were reviewed by me and considered in my medical decision making (see chart for details).     Persistent trapezius spasm/ tightness/ strain. Pain management discussed. Limit weight lifting for the next few days once return to work. Follow up recommendations  provided if symptoms persist. .Return precautions provided. Patient verbalized understanding and agreeable to plan.  Final Clinical Impressions(s) / UC Diagnoses   Final diagnoses:  Trapezius muscle spasm     Discharge Instructions     Heat, massage, stretching as able.  Continue with ibuprofen regularly as prescribed.  Muscle relaxer as needed as prescribed.  Light and regular activity as tolerated. Limit lifting for another 3 days to allow for further improvement.  Follow up with your primary care provider as needed if symptoms persist.    ED Prescriptions    None     PDMP not reviewed this encounter.   [name], NP 10/14/19 1248

## 2019-12-09 ENCOUNTER — Telehealth: Payer: Self-pay

## 2019-12-09 ENCOUNTER — Other Ambulatory Visit: Payer: Self-pay | Admitting: Family Medicine

## 2019-12-09 DIAGNOSIS — M79676 Pain in unspecified toe(s): Secondary | ICD-10-CM

## 2019-12-09 NOTE — Telephone Encounter (Signed)
Referral placed. Please inform patient. Thanks.

## 2019-12-09 NOTE — Progress Notes (Signed)
Patient requesting podiatry referral for toe pain.  Multiple urgent care visits for toe pain.

## 2019-12-09 NOTE — Telephone Encounter (Signed)
Patient LVM on nurse lien requesting a referral for a Podiatrist. Patient stated her toe is not getting any better and causing foot pain. Patient last seen in November, unsure if she needs to be seen before referral can be placed. Multiple toe complaints in chart. Please advise.

## 2019-12-10 NOTE — Telephone Encounter (Signed)
LVM informing pt of below.[name], CMA  

## 2019-12-14 ENCOUNTER — Telehealth: Payer: Self-pay | Admitting: Internal Medicine

## 2019-12-14 NOTE — Telephone Encounter (Signed)
PA request received from [facility] on [facility] Drug requested: Breo 100 CMM Key: [id number] Tried/failed:  Covered alternatives: symbicort, dulera, trelegy, flovent hfa, anoro, arnuity, incruse PA request has been sent to plan, and a determination is expected within 3 days.   Routing to [place] for follow-up.

## 2019-12-15 ENCOUNTER — Other Ambulatory Visit: Payer: Self-pay

## 2019-12-15 ENCOUNTER — Ambulatory Visit (INDEPENDENT_AMBULATORY_CARE_PROVIDER_SITE_OTHER): Payer: Managed Care, Other (non HMO)

## 2019-12-15 ENCOUNTER — Encounter: Payer: Self-pay | Admitting: Podiatry

## 2019-12-15 ENCOUNTER — Ambulatory Visit (INDEPENDENT_AMBULATORY_CARE_PROVIDER_SITE_OTHER): Payer: Managed Care, Other (non HMO) | Admitting: Podiatry

## 2019-12-15 VITALS — Temp 97.8°F

## 2019-12-15 DIAGNOSIS — M25471 Effusion, right ankle: Secondary | ICD-10-CM

## 2019-12-15 DIAGNOSIS — M67479 Ganglion, unspecified ankle and foot: Secondary | ICD-10-CM

## 2019-12-15 DIAGNOSIS — M67471 Ganglion, right ankle and foot: Secondary | ICD-10-CM

## 2019-12-15 DIAGNOSIS — M79671 Pain in right foot: Secondary | ICD-10-CM

## 2019-12-15 DIAGNOSIS — M7751 Other enthesopathy of right foot: Secondary | ICD-10-CM | POA: Diagnosis not present

## 2019-12-15 DIAGNOSIS — M25571 Pain in right ankle and joints of right foot: Secondary | ICD-10-CM

## 2019-12-15 DIAGNOSIS — D492 Neoplasm of unspecified behavior of bone, soft tissue, and skin: Secondary | ICD-10-CM

## 2019-12-15 MED ORDER — MELOXICAM 15 MG PO TABS: 15.0000 mg | ORAL_TABLET | Freq: Every day | ORAL | 0 refills | Status: DC

## 2019-12-17 ENCOUNTER — Telehealth: Payer: Self-pay | Admitting: *Deleted

## 2019-12-17 ENCOUNTER — Telehealth: Payer: Self-pay | Admitting: Podiatry

## 2019-12-17 NOTE — Telephone Encounter (Signed)
Pt presents to office requesting note for restrictions.

## 2019-12-17 NOTE — Telephone Encounter (Signed)
This request was sent to Dr. March Rummage in another Telephone Call.

## 2019-12-17 NOTE — Telephone Encounter (Signed)
Pt needs a letter for work. Her job is asking for a note with restrictions until her cyst is removed. Please advise.

## 2019-12-18 ENCOUNTER — Telehealth: Payer: Self-pay | Admitting: *Deleted

## 2019-12-18 NOTE — Telephone Encounter (Signed)
Pt states she would like accommodation for work hours to be decreased to 6 hours/day, due to pressure from the cyst and waiting on the [place].

## 2019-12-22 ENCOUNTER — Encounter: Payer: Self-pay | Admitting: *Deleted

## 2019-12-22 ENCOUNTER — Other Ambulatory Visit: Payer: Self-pay | Admitting: Podiatry

## 2019-12-22 DIAGNOSIS — M67479 Ganglion, unspecified ankle and foot: Secondary | ICD-10-CM

## 2019-12-22 NOTE — Telephone Encounter (Signed)
Can we write note?

## 2019-12-23 NOTE — Telephone Encounter (Signed)
trelegy 100 will do for now as it has BREO 100 in it.

## 2019-12-23 NOTE — Telephone Encounter (Signed)
Medication name and strength: Breo Ellipta 100-25 MCG PA approved/denied: Denied If denied, reason for denial: Patient needs to try and fail one of these medication's. Fluticasone Propion,Symbicort,Dulera 200-5 MCG,Trelegy 100-62.5-25MCG,Flovent HFA 167mcg,Anoro Ellipta, Arnuity Ellipta 100MCG, Incruse Ellipta.   Dr Melvyn Novas can you please advise.

## 2019-12-23 NOTE — Telephone Encounter (Signed)
Spoke with the pt  She states that she believes that the [place] is indeed covered  She is going to call her pharm and check and call us back tomorrow  If her copay is too high for Breo she will be okay with trying the Trelegy  Will await her call

## 2019-12-28 ENCOUNTER — Ambulatory Visit: Payer: Self-pay | Admitting: Podiatry

## 2019-12-30 NOTE — Telephone Encounter (Signed)
ATC, NA and no option to leave a VM

## 2019-12-30 NOTE — Telephone Encounter (Signed)
Spoke with the pt  She states that she was able to get the Breo inhaler for $3 and she does not wish to change inhalers at this time. Nothing further needed.

## 2020-01-14 ENCOUNTER — Telehealth: Payer: Self-pay | Admitting: *Deleted

## 2020-01-14 NOTE — Progress Notes (Signed)
  Subjective:  Patient ID: Taylor Wilkinson, female    DOB: 04-09-1976,  MRN: [id number]  Chief Complaint  Patient presents with  . Ankle Pain    R ankle, lateral side and R dorsal midfoot. x10 days.Pt stated, "Started suddenly when I was walking at work - intermittent, shocking 10/10 pain. No injuries. 5-6/10 pain when I flex/bend my toes".   44 y.o. female presents with the above complaint. History confirmed with patient.   Objective:  Physical Exam: warm, good capillary refill, no trophic changes or ulcerative lesions, normal DP and PT pulses and normal sensory exam.   Right Foot: Pain to palpation about the lateral ankle about the peroneal tendons; 3 x 2 palpable mass at the dorsal foot.  No images are attached to the encounter.  Radiographs: X-ray of the right foot: no fracture, dislocation, swelling or degenerative changes noted Assessment:   1. Tendonitis of ankle, right   2. Ganglion cyst of right foot   3. Pain and swelling of right ankle      Plan:  Patient was evaluated and treated and all questions answered.  Right ankle tendinitis, likely ganglion cyst -X-rays reviewed patient -Order ultrasound to eval tendons and the possible cyst -Rx meloxicam   No follow-ups on file.

## 2020-01-14 NOTE — Telephone Encounter (Signed)
Pt states she hasn't gotten [place] of foot, and needs a note to be out to 13th.

## 2020-01-19 ENCOUNTER — Telehealth: Payer: Self-pay | Admitting: Podiatry

## 2020-01-19 ENCOUNTER — Encounter: Payer: Self-pay | Admitting: Podiatry

## 2020-01-19 DIAGNOSIS — M779 Enthesopathy, unspecified: Secondary | ICD-10-CM

## 2020-01-19 DIAGNOSIS — M79671 Pain in right foot: Secondary | ICD-10-CM

## 2020-01-19 NOTE — Telephone Encounter (Signed)
Pt came by to ck status of work note to be extended until after u/s.  Patient still has not heard back regarding the scheduling of ultrasound.  I didn't see a message where it has been sent to radiology yet.  Patient states she has left several voicemails on triage line & has not heard from anyone.  Can patient receive a phone call just to update her on what is going on with scheduling.  thanks

## 2020-01-24 ENCOUNTER — Other Ambulatory Visit: Payer: Self-pay | Admitting: Family Medicine

## 2020-01-24 DIAGNOSIS — J4540 Moderate persistent asthma, uncomplicated: Secondary | ICD-10-CM

## 2020-01-28 NOTE — Telephone Encounter (Signed)
[place] - MEDICAID [id number], EFFECTIVE:  01/28/2020, END:  07/26/2020 FOR [place] [id number] RIGHT FOOT. Faxed orders and authorization to [facility].

## 2020-01-28 NOTE — Addendum Note (Signed)
Addended by: Harriett Sine D on: 01/28/2020 10:02 AM   Modules accepted: Orders

## 2020-01-28 NOTE — Telephone Encounter (Signed)
I spoke with pt and informed that the authorization had been received for th [place] and she could schedule with [facility] [phone].

## 2020-02-01 ENCOUNTER — Encounter: Payer: Self-pay | Admitting: Podiatry

## 2020-02-01 ENCOUNTER — Other Ambulatory Visit: Payer: Managed Care, Other (non HMO)

## 2020-02-02 ENCOUNTER — Encounter: Payer: Self-pay | Admitting: Podiatry

## 2020-02-02 NOTE — Telephone Encounter (Signed)
I spoke with [place] and she states pt had to be rescheduled due to there physician had a sudden meeting off site, pt is rescheduled for 02/10/2020 at 9:45am. I informed [place], Disability Coordinator and she states she will contact pt and write note to be out of work until [place] has been reviewed.

## 2020-02-02 NOTE — Telephone Encounter (Signed)
[place] states pt is having to reschedule [place] and will need a updated work note.

## 2020-02-10 ENCOUNTER — Ambulatory Visit
Admission: RE | Admit: 2020-02-10 | Discharge: 2020-02-10 | Disposition: A | Discharge status: Home / Self Care | Payer: Managed Care, Other (non HMO) | Source: Ambulatory Visit | Attending: Podiatry | Admitting: Podiatry

## 2020-02-10 ENCOUNTER — Other Ambulatory Visit: Payer: Self-pay

## 2020-02-10 DIAGNOSIS — M79671 Pain in right foot: Secondary | ICD-10-CM

## 2020-02-10 DIAGNOSIS — M65871 Other synovitis and tenosynovitis, right ankle and foot: Secondary | ICD-10-CM | POA: Diagnosis not present

## 2020-02-10 DIAGNOSIS — M779 Enthesopathy, unspecified: Secondary | ICD-10-CM

## 2020-02-12 ENCOUNTER — Encounter: Payer: Self-pay | Admitting: Podiatry

## 2020-02-19 ENCOUNTER — Other Ambulatory Visit: Payer: Self-pay

## 2020-02-19 ENCOUNTER — Ambulatory Visit (INDEPENDENT_AMBULATORY_CARE_PROVIDER_SITE_OTHER): Payer: Managed Care, Other (non HMO) | Admitting: Podiatry

## 2020-02-19 ENCOUNTER — Encounter: Payer: Self-pay | Admitting: Podiatry

## 2020-02-19 DIAGNOSIS — R600 Localized edema: Secondary | ICD-10-CM | POA: Diagnosis not present

## 2020-02-19 DIAGNOSIS — M67471 Ganglion, right ankle and foot: Secondary | ICD-10-CM | POA: Diagnosis not present

## 2020-02-19 DIAGNOSIS — M2041 Other hammer toe(s) (acquired), right foot: Secondary | ICD-10-CM

## 2020-02-19 DIAGNOSIS — M779 Enthesopathy, unspecified: Secondary | ICD-10-CM

## 2020-02-19 MED ORDER — METHYLPREDNISOLONE 4 MG PO TBPK
ORAL_TABLET | ORAL | 0 refills | Status: DC
Start: 2020-02-19 — End: 2020-03-03

## 2020-02-19 NOTE — Patient Instructions (Signed)
Pre-Operative Instructions  Congratulations, you have decided to take an important step towards improving your quality of life.  You can be assured that the doctors and staff at [facility] will be with you every step of the way.  Here are some important things you should know:  1. Plan to be at the surgery center/hospital at least 1 (one) hour prior to your scheduled time, unless otherwise directed by the surgical center/hospital staff.  You must have a responsible adult accompany you, remain during the surgery and drive you home.  Make sure you have directions to the surgical center/hospital to ensure you arrive on time. 2. If you are having surgery at [facility] or [facility] hospitals, you will need a copy of your medical history and physical form from your family physician within one month prior to the date of surgery. We will give you a form for your primary physician to complete.  3. We make every effort to accommodate the date you request for surgery.  However, there are times where surgery dates or times have to be moved.  We will contact you as soon as possible if a change in schedule is required.   4. No aspirin/ibuprofen for one week before surgery.  If you are on aspirin, any non-steroidal anti-inflammatory medications (Mobic, Aleve, Ibuprofen) should not be taken seven (7) days prior to your surgery.  You make take Tylenol for pain prior to surgery.  5. Medications - If you are taking daily heart and blood pressure medications, seizure, reflux, allergy, asthma, anxiety, pain or diabetes medications, make sure you notify the surgery center/hospital before the day of surgery so they can tell you which medications you should take or avoid the day of surgery. 6. No food or drink after midnight the night before surgery unless directed otherwise by surgical center/hospital staff. 7. No alcoholic beverages 24-hours prior to surgery.  No smoking 24-hours prior or 24-hours after  surgery. 8. Wear loose pants or shorts. They should be loose enough to fit over bandages, boots, and casts. 9. Don't wear slip-on shoes. Sneakers are preferred. 10. Bring your boot with you to the surgery center/hospital.  Also bring crutches or a walker if your physician has prescribed it for you.  If you do not have this equipment, it will be provided for you after surgery. 11. If you have not been contacted by the surgery center/hospital by the day before your surgery, call to confirm the date and time of your surgery. 12. Leave-time from work may vary depending on the type of surgery you have.  Appropriate arrangements should be made prior to surgery with your employer. 13. Prescriptions will be provided immediately following surgery by your doctor.  Fill these as soon as possible after surgery and take the medication as directed. Pain medications will not be refilled on weekends and must be approved by the doctor. 14. Remove nail polish on the operative foot and avoid getting pedicures prior to surgery. 15. Wash the night before surgery.  The night before surgery wash the foot and leg well with water and the antibacterial soap provided. Be sure to pay special attention to beneath the toenails and in between the toes.  Wash for at least three (3) minutes. Rinse thoroughly with water and dry well with a towel.  Perform this wash unless told not to do so by your physician.  Enclosed: 1 Ice pack (please put in freezer the night before surgery)   1 Hibiclens skin cleaner     Pre-op instructions  If you have any questions regarding the instructions, please do not hesitate to call our office.  [place]: [address], [place], [place] [address] -- [phone]  [place]: [address] [place], [place] [address] -- [phone]  [place]: [address], [place], [place] [address] -- [phone]   Website: [email or web address] 

## 2020-02-25 NOTE — Progress Notes (Signed)
°  Subjective:  Patient ID: Taylor Wilkinson, female    DOB: Jan 30, 1976,  MRN: [id number]  Chief Complaint  Patient presents with   Tendonitis    Pt states still experiencing discomfort when standing for long periods. Ultra sound results.   44 y.o. female presents with the above complaint. History confirmed with patient.   Objective:  Physical Exam: warm, good capillary refill, no trophic changes or ulcerative lesions, normal DP and PT pulses and normal sensory exam. Right Foot: POP right 5th hammertoe, pain and edema lateral right foot   Assessment:   1. Tendonitis   2. Hammer toe of right foot   3. Localized edema   4. Ganglion cyst of right foot    Plan:  Patient was evaluated and treated and all questions answered.  Localized Edema, Tendonitis Right\  -[place] reviewed, no cause of swelling at area of concern. Ganglion cyst noted, without pain -Rx medrol pack  Hammertoe right 5th toe -Patient has failed all conservative therapy and wishes to proceed with surgical intervention. All risks, benefits, and alternatives discussed with patient. No guarantees given. Consent reviewed and signed by patient. -Planned procedures: right 5th toe hammertoe correction  Return in about 1 month (around 03/20/2020) for Pre-op planning.

## 2020-02-26 ENCOUNTER — Other Ambulatory Visit: Payer: Self-pay | Admitting: Internal Medicine

## 2020-02-26 ENCOUNTER — Other Ambulatory Visit (HOSPITAL_COMMUNITY)
Admission: RE | Admit: 2020-02-26 | Discharge: 2020-02-26 | Disposition: A | Discharge status: Home / Self Care | Payer: Managed Care, Other (non HMO) | Source: Ambulatory Visit | Attending: Internal Medicine | Admitting: Internal Medicine

## 2020-02-26 DIAGNOSIS — Z20822 Contact with and (suspected) exposure to covid-19: Secondary | ICD-10-CM | POA: Diagnosis not present

## 2020-02-26 DIAGNOSIS — Z01812 Encounter for preprocedural laboratory examination: Secondary | ICD-10-CM | POA: Diagnosis present

## 2020-02-26 DIAGNOSIS — J4540 Moderate persistent asthma, uncomplicated: Secondary | ICD-10-CM

## 2020-02-26 LAB — SARS CORONAVIRUS 2 (TAT 6-24 HRS): SARS Coronavirus 2: NEGATIVE

## 2020-02-29 ENCOUNTER — Other Ambulatory Visit: Payer: Self-pay

## 2020-02-29 ENCOUNTER — Ambulatory Visit (INDEPENDENT_AMBULATORY_CARE_PROVIDER_SITE_OTHER): Payer: Managed Care, Other (non HMO) | Admitting: Internal Medicine

## 2020-02-29 ENCOUNTER — Other Ambulatory Visit: Payer: Self-pay | Admitting: Family Medicine

## 2020-02-29 ENCOUNTER — Encounter: Payer: Self-pay | Admitting: Internal Medicine

## 2020-02-29 ENCOUNTER — Ambulatory Visit: Payer: Managed Care, Other (non HMO) | Admitting: Internal Medicine

## 2020-02-29 DIAGNOSIS — J4540 Moderate persistent asthma, uncomplicated: Secondary | ICD-10-CM | POA: Diagnosis not present

## 2020-02-29 DIAGNOSIS — R21 Rash and other nonspecific skin eruption: Secondary | ICD-10-CM

## 2020-02-29 LAB — PULMONARY FUNCTION TEST
DL/VA % pred: 103 %
DL/VA: 4.45 ml/min/mmHg/L
DLCO cor % pred: 102 %
DLCO cor: 23.63 ml/min/mmHg
DLCO unc % pred: 102 %
DLCO unc: 23.63 ml/min/mmHg
FEF 25-75 Post: 1.7 L/sec
FEF 25-75 Pre: 1.34 L/sec
FEF2575-%Change-Post: 26 %
FEF2575-%Pred-Post: 60 %
FEF2575-%Pred-Pre: 47 %
FEV1-%Change-Post: 5 %
FEV1-%Pred-Post: 100 %
FEV1-%Pred-Pre: 95 %
FEV1-Post: 2.64 L
FEV1-Pre: 2.51 L
FEV1FVC-%Change-Post: 1 %
FEV1FVC-%Pred-Pre: 79 %
FEV6-%Change-Post: 4 %
FEV6-%Pred-Post: 123 %
FEV6-%Pred-Pre: 119 %
FEV6-Post: 3.93 L
FEV6-Pre: 3.77 L
FEV6FVC-%Change-Post: 0 %
FEV6FVC-%Pred-Post: 101 %
FEV6FVC-%Pred-Pre: 100 %
FVC-%Change-Post: 3 %
FVC-%Pred-Post: 122 %
FVC-%Pred-Pre: 118 %
FVC-Post: 3.96 L
FVC-Pre: 3.83 L
Post FEV1/FVC ratio: 67 %
Post FEV6/FVC ratio: 99 %
Pre FEV1/FVC ratio: 66 %
Pre FEV6/FVC Ratio: 98 %
RV % pred: 104 %
RV: 1.85 L
TLC % pred: 110 %
TLC: 5.93 L

## 2020-02-29 NOTE — Progress Notes (Signed)
PFT done today. 

## 2020-03-02 ENCOUNTER — Ambulatory Visit: Payer: Managed Care, Other (non HMO) | Admitting: Internal Medicine

## 2020-03-03 ENCOUNTER — Encounter: Payer: Self-pay | Admitting: *Deleted

## 2020-03-03 ENCOUNTER — Encounter: Payer: Self-pay | Admitting: Internal Medicine

## 2020-03-03 ENCOUNTER — Ambulatory Visit (INDEPENDENT_AMBULATORY_CARE_PROVIDER_SITE_OTHER): Payer: Managed Care, Other (non HMO) | Admitting: Internal Medicine

## 2020-03-03 ENCOUNTER — Other Ambulatory Visit: Payer: Self-pay

## 2020-03-03 DIAGNOSIS — J4540 Moderate persistent asthma, uncomplicated: Secondary | ICD-10-CM | POA: Diagnosis not present

## 2020-03-03 DIAGNOSIS — F17210 Nicotine dependence, cigarettes, uncomplicated: Secondary | ICD-10-CM

## 2020-03-03 MED ORDER — ALBUTEROL SULFATE HFA 108 (90 BASE) MCG/ACT IN AERS: INHALATION_SPRAY | RESPIRATORY_TRACT | 11 refills | Status: DC

## 2020-03-03 NOTE — Progress Notes (Signed)
Taylor Wilkinson, female    DOB: July 03, 1976,    MRN: [id number]   Brief patient profile:  68 yobf  Active smoker   from [place] moved to [place] and fine until 2010 sob while living in house lots of mold and then moved to present residence 2012 "maint" on alb inhaler, albuterol nebulizer and still symptomatic at new house but finally   Much better while on Breo  But worse off Breo and  but better on advair June 2020 then started singulair 10 mg added Oct 2020 but still doe  referred to pulmonary clinic 07/20/2019 by [name] PA for ?  copd     History of Present Illness  07/20/2019  Pulmonary/ 1st office eval/[facility] Chief Complaint  Patient presents with  . Pulmonary Consult    Referred by [name], PA.  Pt states she was dxed with bronchitis 10 yrs ago. She gets SOB when the weather changes. She is using her albuterol inhaler about once per day.  Dyspnea:  Takes neb prior to ex daily o/w says gets tight/ sob with limited ex tol / worse in cold weather Cough: none Sleep: fine now flat SABA use: typically in afternoon with activity  rec Plan A = Automatic = Always=    BREO x one click take 2 puffs each am  Plan B = Backup (to supplement plan A, not to replace it) Only use your albuterol inhaler as a rescue medication Plan C = Crisis (instead of Plan B but only if Plan B stops working) - only use your albuterol nebulizer if you first try Plan B Please schedule a follow up office visit in 6 weeks, call sooner if needed with all medications /inhalers/ solutions in hand     08/31/2019  f/u ov/Taylor Wilkinson re:  Copd/cb still smoking mj on Breo 100 Chief Complaint  Patient presents with  . Follow-up    Patient reports that she is doing great since starting on the Breo.   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 Follow-up    Breathing is doing well and she has been out of her albuterol for 1-2 months.    Dyspnea:  Not limited by breathing from desired activities   Cough: none Sleeping: ok prone  SABA use: none 02: none    No obvious day to day or daytime variability or assoc excess/ purulent sputum or mucus plugs or hemoptysis or cp or chest tightness, subjective wheeze or overt sinus or hb symptoms.   Sleeping as above without nocturnal  or early am exacerbation  of respiratory  c/o's or need for noct saba. Also denies any obvious fluctuation of symptoms with weather or environmental changes or other aggravating or alleviating factors except as outlined above   No unusual exposure hx or h/o childhood pna/ asthma or knowledge of premature birth.  Current Allergies, Complete  Past Medical History, Past Surgical History, Family History, and Social History were reviewed in [employer] record.  ROS  The following are not active complaints unless bolded Hoarseness, sore throat, dysphagia, dental problems, itching, sneezing,  nasal congestion or discharge of excess mucus or purulent secretions, ear ache,   fever, chills, sweats, unintended wt loss or wt gain, classically pleuritic or exertional cp,  orthopnea pnd or arm/hand swelling  or leg swelling, presyncope, palpitations, abdominal pain, anorexia, nausea, vomiting, diarrhea  or change in bowel habits or change in bladder habits, change in stools or change in urine, dysuria, hematuria,  rash, arthralgias, visual complaints, headache, numbness, weakness or ataxia or problems with walking or coordination,   change in mood or  memory.        Current Meds  Medication Sig  . fluticasone furoate-vilanterol (BREO ELLIPTA) 100-25 MCG/INH AEPB Inhale 1 puff into the lungs daily.           Past Medical History:  Diagnosis Date  . Asthma   . Bronchitis   . Miscarriage       Objective:     amb bf nad   Vital signs reviewed  03/03/2020  - Note at rest 02 sats  100% on RA     03/03/2020      190   08/31/19 197 lb 3.2 oz (89.4 kg)  08/05/19 196 lb 3.2 oz (89 kg)  07/20/19 194 lb (88 kg)     HEENT : pt wearing mask not removed for exam due to covid -19 concerns.    NECK :  without JVD/Nodes/TM/ nl carotid upstrokes bilaterally   LUNGS: no acc muscle use,  Nl contour chest which is clear to A and P bilaterally without cough on insp or exp maneuvers   CV:  RRR  no s3 or murmur or increase in P2, and no edema   ABD:  soft and nontender with nl inspiratory excursion in the supine position. No bruits or organomegaly appreciated, bowel sounds nl  MS:  Nl gait/ ext warm without deformities, calf tenderness, cyanosis or clubbing No obvious joint restrictions   SKIN: warm and dry without lesions    NEURO:  alert, approp, nl sensorium with  no motor or cerebellar deficits apparent.                  Assessment

## 2020-03-03 NOTE — Patient Instructions (Signed)
Plan A = Automatic = Always=    Breo one click each am   Plan B = Backup (to supplement plan A, not to replace it) Only use your albuterol inhaler as a rescue medication to be used if you can't catch your breath by resting or doing a relaxed purse lip breathing pattern.  - The less you use it, the better it will work when you need it. - Ok to use the inhaler up to 2 puffs  every 4 hours if you must but call for appointment if use goes up over your usual need - Don't leave home without it !!  (think of it like the spare tire for your car)   I strongly recommend you get the covid 19 vaccines thruough your local drugstore asap   Please schedule a follow up visit in 12 months but call sooner if needed

## 2020-03-04 ENCOUNTER — Encounter: Payer: Self-pay | Admitting: Internal Medicine

## 2020-03-04 NOTE — Assessment & Plan Note (Signed)
Counseled re importance of smoking cessation but did not meet time criteria for separate billing            Each maintenance medication was reviewed in detail including emphasizing most importantly the difference between maintenance and prns and under what circumstances the prns are to be triggered using an action plan format where appropriate.  Total time for H and P, chart review, counseling, reviewing elipta device and generating customized AVS unique to this office visit / charting = 20 min

## 2020-03-04 NOTE — Assessment & Plan Note (Signed)
Reports smoking cigs/ MJ  - onset of symptoms 2010 improved on Breo/advair - Spirometry 02/13/18  FEV1 2.30 (98%)  Ratio 0.65    - 07/20/2019 Breo 100 x 6 weeks samples given then return with formulary > much better 08/31/2019 but no formulary in hand  - PFT's  02/29/2020  FEV1 2.64 (100 % ) ratio 0.67  p 5 % improvement from saba p 0 prior to study with DLCO  23.63 (102%) corrects to 4.45 (103%)  for alv volume and FV curve mild concavity    With nl dlco and minimal obstruction this is better characterized as chronic asthma but could well have beginnings of copd and rx for now with low dose breo/ prn saba best options other than breathe clean air.  Pt informed of the seriousness of COVID 19 infection as a direct risk to lung health  and safey and to close contacts and should continue to wear a facemask in public and minimize exposure to public locations but especially avoid any area or activity where non-close contacts are not observing distancing or wearing an appropriate face mask.  I strongly recommended she take either of the vaccines available through local drugstores based on updated information on millions of Americans treated with the [place] products  which have proven both safe and  effective even against the new delta variant.

## 2020-03-10 ENCOUNTER — Ambulatory Visit (HOSPITAL_COMMUNITY)
Admission: EM | Admit: 2020-03-10 | Discharge: 2020-03-10 | Disposition: A | Discharge status: Home / Self Care | Payer: Managed Care, Other (non HMO) | Attending: Internal Medicine | Admitting: Internal Medicine

## 2020-03-10 ENCOUNTER — Other Ambulatory Visit: Payer: Self-pay

## 2020-03-10 ENCOUNTER — Ambulatory Visit (INDEPENDENT_AMBULATORY_CARE_PROVIDER_SITE_OTHER): Payer: Managed Care, Other (non HMO)

## 2020-03-10 ENCOUNTER — Encounter (HOSPITAL_COMMUNITY): Payer: Self-pay

## 2020-03-10 DIAGNOSIS — J4541 Moderate persistent asthma with (acute) exacerbation: Secondary | ICD-10-CM | POA: Insufficient documentation

## 2020-03-10 DIAGNOSIS — R062 Wheezing: Secondary | ICD-10-CM

## 2020-03-10 DIAGNOSIS — Z20822 Contact with and (suspected) exposure to covid-19: Secondary | ICD-10-CM | POA: Diagnosis not present

## 2020-03-10 DIAGNOSIS — F17210 Nicotine dependence, cigarettes, uncomplicated: Secondary | ICD-10-CM

## 2020-03-10 DIAGNOSIS — J45909 Unspecified asthma, uncomplicated: Secondary | ICD-10-CM | POA: Diagnosis not present

## 2020-03-10 DIAGNOSIS — R0602 Shortness of breath: Secondary | ICD-10-CM

## 2020-03-10 DIAGNOSIS — R05 Cough: Secondary | ICD-10-CM

## 2020-03-10 LAB — SARS CORONAVIRUS 2 (TAT 6-24 HRS): SARS Coronavirus 2: NEGATIVE

## 2020-03-10 MED ORDER — IPRATROPIUM-ALBUTEROL 0.5-2.5 (3) MG/3ML IN SOLN: 3.0000 mL | RESPIRATORY_TRACT | 0 refills | Status: DC | PRN

## 2020-03-10 MED ORDER — PREDNISONE 20 MG PO TABS
40.0000 mg | ORAL_TABLET | Freq: Every day | ORAL | 0 refills | Status: AC
Start: 2020-03-10 — End: 2020-03-15

## 2020-03-10 NOTE — ED Provider Notes (Signed)
[facility]    CSN: [id number] Arrival date & time: 03/10/20  6967      History   Chief Complaint Chief Complaint  Patient presents with  . Shortness of Breath    HPI Taylor Wilkinson is a 44 y.o. female with a history of moderate persistent asthma comes to the urgent care with complaint of cough productive of clear to yellowish sputum, shortness of breath, wheezing and chest tightness of 5 days duration.  Patient says symptoms started insidiously and has been progressively worse.  She denies any improvement with albuterol use.  No fever or chills.  No nausea or vomiting.  No dizziness, near syncope or syncopal episodes.  No sore throat, nasal congestion or rhinorrhea.  No sick contacts. HPI  Past Medical History:  Diagnosis Date  . Asthma   . Bronchitis   . Miscarriage     Patient Active Problem List   Diagnosis Date Noted  . Rash and nonspecific skin eruption 08/05/2019  . Encounter for screening for cervical cancer  06/09/2019  . Candidal intertrigo 06/09/2019  . Moderate persistent asthma without complication 89/38/1017  . Cigarette smoker 02/13/2018  . Shortness of breath 02/06/2018  . Chronic urticaria 02/06/2018  . Tachycardia 01/04/2013    Past Surgical History:  Procedure Laterality Date  . NO PAST SURGERIES      OB History    Gravida  6   Para  4   Term  4   Preterm      AB  1   Living  4     SAB  1   TAB      Ectopic      Multiple      Live Births               Home Medications    Prior to Admission medications   Medication Sig Start Date End Date Taking? Authorizing Provider  albuterol (PROAIR HFA) 108 (90 Base) MCG/ACT inhaler 2 puffs every 4 hours as needed only  if your can't catch your breath 03/03/20  Yes Tanda Rockers, MD  fluticasone furoate-vilanterol (BREO ELLIPTA) 100-25 MCG/INH AEPB Inhale 1 puff into the lungs daily. 08/31/19  Yes Tanda Rockers, MD  ipratropium-albuterol (DUONEB) 0.5-2.5 (3) MG/3ML  SOLN Take 3 mLs by nebulization every 4 (four) hours as needed. 03/10/20   Doy Taaffe, Myrene Galas, MD  predniSONE (DELTASONE) 20 MG tablet Take 2 tablets (40 mg total) by mouth daily for 5 days. 03/10/20 03/15/20  Chase Picket, MD  loratadine (CLARITIN) 10 MG tablet Take 1 tablet (10 mg total) by mouth daily. 06/28/19 07/23/19  [name], NP  montelukast (SINGULAIR) 10 MG tablet Take 1 tablet (10 mg total) by mouth at bedtime. Patient not taking: Reported on 08/31/2019 07/08/19 10/11/19  [name], PA-C    Family History Family History  Problem Relation Age of Onset  . Hypertension Mother   . Diabetes Mother   . Asthma Brother   . Healthy Father     Social History Social History   Tobacco Use  . Smoking status: Current Every Day Smoker    Packs/day: 0.50    Years: 16.00    Pack years: 8.00    Types: Cigars  . Smokeless tobacco: Never Used  . Tobacco comment: 3 cigars per day  Vaping Use  . Vaping Use: Never used  Substance Use Topics  . Alcohol use: Yes    Comment: social  . Drug use: Yes  Types: Marijuana    Comment: marijuana--blunt a day--maybe 2 a day     Allergies   Patient has no known allergies.   Review of Systems Review of Systems  Constitutional: Negative.   HENT: Negative.   Respiratory: Positive for cough, chest tightness, shortness of breath and wheezing.   Cardiovascular: Negative for chest pain and palpitations.  Gastrointestinal: Negative for abdominal pain, diarrhea, nausea and vomiting.  Neurological: Negative for dizziness, light-headedness and headaches.     Physical Exam Triage Vital Signs ED Triage Vitals  Enc Vitals Group     BP 03/10/20 1001 140/88     Pulse Rate 03/10/20 1001 81     Resp 03/10/20 1001 20     Temp 03/10/20 1001 98.9 F (37.2 C)     Temp Source 03/10/20 1001 Oral     SpO2 03/10/20 1001 98 %     Weight --      Height --      Head Circumference --      Peak Flow --      Pain Score 03/10/20 0958 0     Pain  Loc --      Pain Edu? --      Excl. in [place]? --    No data found.  Updated Vital Signs BP 140/88 (BP Location: Right Arm)   Pulse 81   Temp 98.9 F (37.2 C) (Oral)   Resp 20   LMP 02/24/2020   SpO2 98%   Visual Acuity Right Eye Distance:   Left Eye Distance:   Bilateral Distance:    Right Eye Near:   Left Eye Near:    Bilateral Near:     Physical Exam Vitals and nursing note reviewed.  Constitutional:      General: She is in acute distress.     Appearance: She is well-developed. She is not ill-appearing.  Neck:     Thyroid: No thyromegaly.  Cardiovascular:     Rate and Rhythm: Normal rate and regular rhythm.  Pulmonary:     Breath sounds: Examination of the right-upper field reveals decreased breath sounds and wheezing. Examination of the left-upper field reveals decreased breath sounds and wheezing. Examination of the right-middle field reveals decreased breath sounds and wheezing. Examination of the left-middle field reveals decreased breath sounds and wheezing. Examination of the right-lower field reveals decreased breath sounds and wheezing. Examination of the left-lower field reveals decreased breath sounds and wheezing. Decreased breath sounds and wheezing present. No rhonchi or rales.  Chest:     Chest wall: No mass, crepitus or edema.  Abdominal:     Palpations: Abdomen is soft. There is no hepatomegaly, splenomegaly or mass.     Tenderness: There is no abdominal tenderness.  Musculoskeletal:     Cervical back: Normal range of motion.  Lymphadenopathy:     Cervical: No cervical adenopathy.  Neurological:     Mental Status: She is alert.      UC Treatments / Results  Labs (all labs ordered are listed, but only abnormal results are displayed) Labs Reviewed  SARS CORONAVIRUS 2 (TAT 6-24 HRS)    EKG   Radiology No results found.  Procedures Procedures (including critical care time)  Medications Ordered in UC Medications - No data to  display  Initial Impression / Assessment and Plan / UC Course  I have reviewed the triage vital signs and the nursing notes.  Pertinent labs & imaging results that were available during my care of the patient were reviewed  by me and considered in my medical decision making (see chart for details).    1.  Moderate persistent asthma with acute exacerbation: DuoNeb nebulization solutions Chest x-ray is negative for acute lung infiltrate Prednisone 40 mg orally daily for 5 days Patient is encouraged to use Breo and albuterol inhaler as needed No indication for antibiotics at this time Return to urgent care if symptoms worsen. Final Clinical Impressions(s) / UC Diagnoses   Final diagnoses:  Moderate persistent asthma with (acute) exacerbation   Discharge Instructions   None    ED Prescriptions    Medication Sig Dispense Auth. Provider   ipratropium-albuterol (DUONEB) 0.5-2.5 (3) MG/3ML SOLN Take 3 mLs by nebulization every 4 (four) hours as needed. 360 mL Tayden Duran, Myrene Galas, MD   predniSONE (DELTASONE) 20 MG tablet Take 2 tablets (40 mg total) by mouth daily for 5 days. 10 tablet Kynlie Jane, Myrene Galas, MD     PDMP not reviewed this encounter.   Chase Picket, MD 03/10/20 1054

## 2020-03-10 NOTE — ED Notes (Signed)
RN to waiting room to assess pt prior to room assigment. Pt found to be 99% on RA. Pt speaking in full sentences, laughing out loud, and breathing even and unlabored with out accessory muscle use.

## 2020-03-10 NOTE — ED Triage Notes (Signed)
Pt c/o SOB since Saturday after spending the night at a friend's house; pt reports that she has been out of her albuterol nebulizer medication.  Pt also c/o productive cough with yellow sputum.  Able to speak full/long sentences rapidly w/o difficulty.  Bilat lung sounds with wheezes throughout.  Denies sore throat, runny nose, body aches, n/v/d, abdom pain or other c/o.

## 2020-03-17 ENCOUNTER — Encounter (HOSPITAL_COMMUNITY): Payer: Self-pay | Admitting: Emergency Medicine

## 2020-03-17 ENCOUNTER — Ambulatory Visit (HOSPITAL_COMMUNITY)
Admission: EM | Admit: 2020-03-17 | Discharge: 2020-03-17 | Disposition: A | Discharge status: Home / Self Care | Payer: Managed Care, Other (non HMO) | Attending: Family Medicine | Admitting: Family Medicine

## 2020-03-17 ENCOUNTER — Other Ambulatory Visit: Payer: Self-pay

## 2020-03-17 DIAGNOSIS — L03213 Periorbital cellulitis: Secondary | ICD-10-CM | POA: Diagnosis not present

## 2020-03-17 MED ORDER — SULFAMETHOXAZOLE-TRIMETHOPRIM 800-160 MG PO TABS
1.0000 | ORAL_TABLET | Freq: Two times a day (BID) | ORAL | 0 refills | Status: DC
Start: 2020-03-17 — End: 2020-03-17

## 2020-03-17 MED ORDER — AMOXICILLIN-POT CLAVULANATE 875-125 MG PO TABS
1.0000 | ORAL_TABLET | Freq: Two times a day (BID) | ORAL | 0 refills | Status: AC
Start: 2020-03-17 — End: 2020-03-27

## 2020-03-17 NOTE — ED Triage Notes (Signed)
Patient says one week ago there was a bump below eye, and she popped it.  Patient now speaks of swelling and fullness to upper lid and inner aspect of right eye.  Patient says this fullness at corner of eye makes it feel like something in eye.  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

## 2020-03-17 NOTE — ED Provider Notes (Signed)
[facility]    CSN: [id number] Arrival date & time: 03/17/20  7616      History   Chief Complaint No chief complaint on file.   HPI Taylor Wilkinson is a 44 y.o. female.   Patient is a 44 year old female the presents today with swelling under the right eye.  Reporting approximate 1 week ago started as a bump on the eye which she popped and drained.  Now there is significant swelling, tenderness.  Denies any pain with eye or blurred vision.  No fevers, chills.  ROS per HPI      Past Medical History:  Diagnosis Date   Asthma    Bronchitis    Miscarriage     Patient Active Problem List   Diagnosis Date Noted   Rash and nonspecific skin eruption 08/05/2019   Encounter for screening for cervical cancer  06/09/2019   Candidal intertrigo 06/09/2019   Moderate persistent asthma without complication 07/37/1062   Cigarette smoker 02/13/2018   Shortness of breath 02/06/2018   Chronic urticaria 02/06/2018   Tachycardia 01/04/2013    Past Surgical History:  Procedure Laterality Date   NO PAST SURGERIES      OB History    Gravida  6   Para  4   Term  4   Preterm      AB  1   Living  4     SAB  1   TAB      Ectopic      Multiple      Live Births               Home Medications    Prior to Admission medications   Medication Sig Start Date End Date Taking? Authorizing Provider  albuterol (PROAIR HFA) 108 (90 Base) MCG/ACT inhaler 2 puffs every 4 hours as needed only  if your can't catch your breath 03/03/20   Tanda Rockers, MD  amoxicillin-clavulanate (AUGMENTIN) 875-125 MG tablet Take 1 tablet by mouth every 12 (twelve) hours for 10 days. 03/17/20 03/27/20  Terrick Allred, Tressia Miners A, NP  fluticasone furoate-vilanterol (BREO ELLIPTA) 100-25 MCG/INH AEPB Inhale 1 puff into the lungs daily. 08/31/19   Tanda Rockers, MD  ipratropium-albuterol (DUONEB) 0.5-2.5 (3) MG/3ML SOLN Take 3 mLs by nebulization every 4 (four) hours as needed. 03/10/20    Chase Picket, MD  loratadine (CLARITIN) 10 MG tablet Take 1 tablet (10 mg total) by mouth daily. 06/28/19 07/23/19  [name], NP  montelukast (SINGULAIR) 10 MG tablet Take 1 tablet (10 mg total) by mouth at bedtime. Patient not taking: Reported on 08/31/2019 07/08/19 10/11/19  [name], PA-C    Family History Family History  Problem Relation Age of Onset   Hypertension Mother    Diabetes Mother    Asthma Brother    Healthy Father     Social History Social History   Tobacco Use   Smoking status: Current Every Day Smoker    Packs/day: 0.50    Years: 16.00    Pack years: 8.00    Types: Cigars   Smokeless tobacco: Never Used   Tobacco comment: 3 cigars per day  Vaping Use   Vaping Use: Never used  Substance Use Topics   Alcohol use: Yes    Comment: social   Drug use: Yes    Types: Marijuana    Comment: marijuana--blunt a day--maybe 2 a day     Allergies   Patient has no known allergies.  Review of Systems Review of Systems   Physical Exam Triage Vital Signs ED Triage Vitals  Enc Vitals Group     BP 03/17/20 0907 (!) 140/93     Pulse Rate 03/17/20 0907 88     Resp 03/17/20 0907 18     Temp 03/17/20 0907 98.3 F (36.8 C)     Temp Source 03/17/20 0907 Oral     SpO2 03/17/20 0907 97 %     Weight --      Height --      Head Circumference --      Peak Flow --      Pain Score 03/17/20 0904 5     Pain Loc --      Pain Edu? --      Excl. in [place]? --    No data found.  Updated Vital Signs BP (!) 140/93 (BP Location: Left Arm)    Pulse 88    Temp 98.3 F (36.8 C) (Oral)    Resp 18    LMP 02/24/2020    SpO2 97%   Visual Acuity Right Eye Distance:   Left Eye Distance:   Bilateral Distance:    Right Eye Near:   Left Eye Near:    Bilateral Near:     Physical Exam Vitals and nursing note reviewed.  Constitutional:      General: She is not in acute distress.    Appearance: Normal appearance. She is not ill-appearing,  toxic-appearing or diaphoretic.  HENT:     Head: Normocephalic.      Comments: Erythema, swelling and mildly tender to palpation. No pain with EOM Upper lid normal Small palpable bump just under the inner canthus    Nose: Nose normal.  Eyes:     Conjunctiva/sclera: Conjunctivae normal.  Pulmonary:     Effort: Pulmonary effort is normal.  Musculoskeletal:        General: Normal range of motion.     Cervical back: Normal range of motion.  Skin:    General: Skin is warm and dry.     Findings: No rash.  Neurological:     Mental Status: She is alert.  Psychiatric:        Mood and Affect: Mood normal.      UC Treatments / Results  Labs (all labs ordered are listed, but only abnormal results are displayed) Labs Reviewed - No data to display  EKG   Radiology No results found.  Procedures Procedures (including critical care time)  Medications Ordered in UC Medications - No data to display  Initial Impression / Assessment and Plan / UC Course  I have reviewed the triage vital signs and the nursing notes.  Pertinent labs & imaging results that were available during my care of the patient were reviewed by me and considered in my medical decision making (see chart for details).     Preseptal cellulitis of the right eye Treating with Augmentin Recommended warm compresses Follow up as needed for continued or worsening symptoms  Final Clinical Impressions(s) / UC Diagnoses   Final diagnoses:  Preseptal cellulitis of right eye     Discharge Instructions     Take the antibiotics as prescribed.  Warm compresses to the eye. Augmentin for 10 days.  Please follow-up for any continued or worsening symptoms    ED Prescriptions    Medication Sig Dispense Auth. Provider   sulfamethoxazole-trimethoprim (BACTRIM DS) 800-160 MG tablet  (Status: Discontinued) Take 1 tablet by mouth 2 (two) times daily  for 7 days. 14 tablet Maryalice Pasley A, NP   amoxicillin-clavulanate  (AUGMENTIN) 875-125 MG tablet Take 1 tablet by mouth every 12 (twelve) hours for 10 days. 20 tablet Loura Halt A, NP     PDMP not reviewed this encounter.   Loura Halt A, NP 03/17/20 1052

## 2020-03-17 NOTE — Discharge Instructions (Addendum)
Take the antibiotics as prescribed.  Warm compresses to the eye. Augmentin for 10 days.  Please follow-up for any continued or worsening symptoms

## 2020-03-25 ENCOUNTER — Ambulatory Visit: Payer: Managed Care, Other (non HMO) | Admitting: Podiatry

## 2020-03-31 ENCOUNTER — Ambulatory Visit (INDEPENDENT_AMBULATORY_CARE_PROVIDER_SITE_OTHER): Payer: Managed Care, Other (non HMO) | Admitting: Podiatry

## 2020-03-31 ENCOUNTER — Other Ambulatory Visit: Payer: Self-pay

## 2020-03-31 DIAGNOSIS — M779 Enthesopathy, unspecified: Secondary | ICD-10-CM

## 2020-03-31 DIAGNOSIS — M67471 Ganglion, right ankle and foot: Secondary | ICD-10-CM

## 2020-03-31 DIAGNOSIS — R600 Localized edema: Secondary | ICD-10-CM

## 2020-03-31 DIAGNOSIS — M2041 Other hammer toe(s) (acquired), right foot: Secondary | ICD-10-CM | POA: Diagnosis not present

## 2020-03-31 NOTE — Progress Notes (Signed)
  Subjective:  Patient ID: Taylor Wilkinson, female    DOB: [birth year],  MRN: [id number]  Chief Complaint  Patient presents with  . Foot Pain    pt is here for possible pre op planning of the right foot, pt states that the right foot is elevated when she is at work, pt also states that the right foot also has fluid build up. And is concerned that it is getting worse. Pt states that the prednisone she recieved has not helped.   44 y.o. female presents with the above complaint. History confirmed with patient.   Objective:  Physical Exam: warm, good capillary refill, no trophic changes or ulcerative lesions, normal DP and PT pulses and normal sensory exam. Right Foot: POP right 4th/5th hammertoe, pain and edema lateral right foot pain along peroneal tendons.  Assessment:   1. Tendonitis   2. Hammer toe of right foot   3. Localized edema   4. Ganglion cyst of right foot    Plan:  Patient was evaluated and treated and all questions answered.  Localized Edema, Tendonitis Right -Order MRI right ankle for further review. R/o peroneal tendon tear  Hammertoe right 5th toe, now with 4th toe pain -Pending surgery for 5th toe -Will add on 4th toe correction  Return in about 3 weeks (around 04/21/2020) for MRI Review Right ankle.

## 2020-04-01 ENCOUNTER — Telehealth: Payer: Self-pay | Admitting: *Deleted

## 2020-04-01 DIAGNOSIS — T148XXA Other injury of unspecified body region, initial encounter: Secondary | ICD-10-CM

## 2020-04-01 DIAGNOSIS — M779 Enthesopathy, unspecified: Secondary | ICD-10-CM

## 2020-04-01 NOTE — Telephone Encounter (Signed)
-----   Message from Evelina Bucy, DPM sent at 03/31/2020  6:26 PM EDT ----- Can we order MRI right ankle? R/p peroneal tendon tear

## 2020-04-01 NOTE — Telephone Encounter (Signed)
Faxed orders, clinicals and demographics to [place] for pre-cert and scheduling. Medicaid is prepaid does not need pre-cert.

## 2020-04-06 ENCOUNTER — Other Ambulatory Visit: Payer: Self-pay | Admitting: Podiatry

## 2020-04-07 ENCOUNTER — Telehealth: Payer: Self-pay | Admitting: *Deleted

## 2020-04-07 NOTE — Telephone Encounter (Signed)
Faxed orders, clinicals and insurance to Medicaid.

## 2020-04-07 NOTE — Telephone Encounter (Signed)
-----   Message from Sandre Kitty sent at 04/06/2020  9:21 AM EDT ----- Regarding: Clinicals requested from [place] morning,  This patient's medicaid is requiring prior auth as 2ndary coverage.  They have requested the medical record be provided.   They will not accept them from the rendering facility, and I am not able to log in to [facility] using your credentials at this time.   Please upload the information on [facility] website or fax to [phone].   The ref# that needs to be on the cover page of the fax is  [id number].  Thank you,  Anderson Malta

## 2020-04-13 ENCOUNTER — Encounter: Payer: Self-pay | Admitting: Podiatry

## 2020-04-19 ENCOUNTER — Ambulatory Visit: Payer: Managed Care, Other (non HMO) | Admitting: Podiatry

## 2020-04-21 ENCOUNTER — Encounter: Payer: Self-pay | Admitting: Podiatry

## 2020-04-26 ENCOUNTER — Ambulatory Visit
Admission: RE | Admit: 2020-04-26 | Discharge: 2020-04-26 | Disposition: A | Discharge status: Home / Self Care | Payer: Managed Care, Other (non HMO) | Source: Ambulatory Visit | Attending: Podiatry | Admitting: Podiatry

## 2020-04-26 ENCOUNTER — Other Ambulatory Visit: Payer: Self-pay

## 2020-04-26 ENCOUNTER — Encounter: Payer: Managed Care, Other (non HMO) | Admitting: Podiatry

## 2020-04-26 DIAGNOSIS — M19071 Primary osteoarthritis, right ankle and foot: Secondary | ICD-10-CM | POA: Diagnosis not present

## 2020-04-26 DIAGNOSIS — S93491A Sprain of other ligament of right ankle, initial encounter: Secondary | ICD-10-CM | POA: Diagnosis not present

## 2020-05-03 ENCOUNTER — Encounter: Payer: Managed Care, Other (non HMO) | Admitting: Podiatry

## 2020-05-03 ENCOUNTER — Encounter: Payer: Self-pay | Admitting: Podiatry

## 2020-05-03 ENCOUNTER — Other Ambulatory Visit: Payer: Self-pay

## 2020-05-03 ENCOUNTER — Ambulatory Visit (INDEPENDENT_AMBULATORY_CARE_PROVIDER_SITE_OTHER): Payer: Managed Care, Other (non HMO) | Admitting: Podiatry

## 2020-05-03 DIAGNOSIS — M25571 Pain in right ankle and joints of right foot: Secondary | ICD-10-CM | POA: Diagnosis not present

## 2020-05-03 DIAGNOSIS — M79671 Pain in right foot: Secondary | ICD-10-CM | POA: Diagnosis not present

## 2020-05-03 DIAGNOSIS — S93491D Sprain of other ligament of right ankle, subsequent encounter: Secondary | ICD-10-CM

## 2020-05-03 DIAGNOSIS — M24271 Disorder of ligament, right ankle: Secondary | ICD-10-CM | POA: Diagnosis not present

## 2020-05-03 DIAGNOSIS — M2041 Other hammer toe(s) (acquired), right foot: Secondary | ICD-10-CM

## 2020-05-03 DIAGNOSIS — M25471 Effusion, right ankle: Secondary | ICD-10-CM

## 2020-05-03 NOTE — Progress Notes (Signed)
  Subjective:  Patient ID: Taylor Wilkinson, female    DOB: 1976-02-22,  MRN: [id number]  Chief Complaint  Patient presents with  . Follow-up    R ankle. Pt stated, "It's worse. The pain is worse when I'm at rest - 8/10". Pt wants to discuss MRI results.   44 y.o. female presents with the above complaint. History confirmed with patient.   Objective:  Physical Exam: warm, good capillary refill, no trophic changes or ulcerative lesions, normal DP and PT pulses and normal sensory exam. Right Foot: POP right 4th/5th hammertoe, pain and edema lateral right foot pain about the ATFL with increased ankle inversion noted.  Assessment:   1. Sprain of anterior talofibular ligament of right ankle, subsequent encounter   2. Ligamentous laxity of right ankle   3. Hammer toe of right foot   4. Pain in right foot   5. Pain and swelling of right ankle    Plan:  Patient was evaluated and treated and all questions answered.  Chronic R ATFL Tear -MRI Reviewed with patient -Plan for repair and augmentation of ligament  Hammertoe right 4th/5th toes -Proceed with surgical correction of the hammertoes  -Patient has failed all conservative therapy and wishes to proceed with surgical intervention. All risks, benefits, and alternatives discussed with patient. No guarantees given. Consent reviewed and signed by patient. -Planned procedures: Right foot repair and augmentation of lateral ankle ligament, 4th/5th toes    Total time for visit both face-to-face and non face-to-face including patient care, review of chart/imaging, documentation: 35 mins    No follow-ups on file.

## 2020-05-17 ENCOUNTER — Encounter: Payer: Managed Care, Other (non HMO) | Admitting: Podiatry

## 2020-05-23 ENCOUNTER — Telehealth: Payer: Self-pay | Admitting: Internal Medicine

## 2020-05-23 ENCOUNTER — Other Ambulatory Visit: Payer: Self-pay | Admitting: Obstetrics

## 2020-05-23 DIAGNOSIS — B3731 Acute candidiasis of vulva and vagina: Secondary | ICD-10-CM

## 2020-05-23 NOTE — Telephone Encounter (Signed)
Hold for 9/14 to initiate PA.

## 2020-05-24 NOTE — Telephone Encounter (Signed)
PA has been initiated via cover my meds:  Medication name and strength: Breo 100 Provider: [place]: [facility] Patient insurance ID: [id number] Phone: [phone] Fax: [phone]  Was the PA started on CMM?  yes If yes, please enter the Key: [id number] Timeframe for approval/denial: [employer] is reviewing your PA request and will respond within 24 hours for Medicaid or up to 72 hours for non-Medicaid plans, based on the required timeframe determined by state or federal regulations. To check for an update later, open this request from your dashboard.   Will keep encounter open and wait for PA update.

## 2020-05-25 ENCOUNTER — Encounter: Payer: Self-pay | Admitting: Podiatry

## 2020-05-27 ENCOUNTER — Telehealth: Payer: Self-pay

## 2020-05-27 NOTE — Telephone Encounter (Signed)
DOS 06/09/2020  ANKLE STATBILIZATION RT - 79150 & [id number] HAMMERTOE REPAIR 4,5 RT - 28285  [facility] MEDICAID EFFECTIVE DATE - 03/10/2020    NOTIFICATION/PRIOR AUTHORIZATION NUMBER [id number]   Covered/Approved 1-3 CODE DESCRIPTION COVERAGE STATUS DECISION DATE [facility] Coverage determination is reflected for the facility admission and is not a guarantee of payment for ongoing services. Covered/Approved 05/25/2020 1 28285 Correction, hammertoe (eg, interphalange more Covered/Approved 05/25/2020 2 82707 Repair, primary, disrupted ligament, ank more Covered/Approved 05/25/2020 3 27695 Repair, primary, disrupted ligament, ank more Cancelled 05/23/2020

## 2020-05-31 ENCOUNTER — Encounter: Payer: Managed Care, Other (non HMO) | Admitting: Podiatry

## 2020-05-31 NOTE — Telephone Encounter (Signed)
Received a fax from [facility] denial.  Covered alternatives are: generic airduo, [employer]an, Symbicort, and [employer].   Per chart pt has tried and failed Advair 250 and has been on Breo 100 since 03/2018.  Dr. Melvyn Novas please advise which alternative med you would like to prescribe.  Thanks!

## 2020-05-31 NOTE — Telephone Encounter (Signed)
PA for Taylor Wilkinson has been denied.   Dr. Melvyn Novas, please advise if you would like to appeal the denial or we can find a covered alternative for you to choose. Thanks.

## 2020-05-31 NOTE — Telephone Encounter (Signed)
Can give trelegy 100 sample if we don't have breo and ov asap with NP or pharmacy with formulary in hand to pick longterm alternative

## 2020-05-31 NOTE — Telephone Encounter (Signed)
Pharmacy team, Can you please advise on which inhaler patient's insurance will cover?  Insurance denied PA for [employer]. We have no samples of Breo 100 or Trelegy 100.  We need to know what we can send in for this patient.  Thank you.

## 2020-06-01 ENCOUNTER — Other Ambulatory Visit: Payer: Self-pay | Admitting: Podiatry

## 2020-06-01 ENCOUNTER — Other Ambulatory Visit: Payer: Self-pay | Admitting: Family Medicine

## 2020-06-01 DIAGNOSIS — R21 Rash and other nonspecific skin eruption: Secondary | ICD-10-CM

## 2020-06-01 MED ORDER — BUDESONIDE-FORMOTEROL FUMARATE 160-4.5 MCG/ACT IN AERO
2.0000 | INHALATION_SPRAY | Freq: Two times a day (BID) | RESPIRATORY_TRACT | 11 refills | Status: DC
Start: 2020-06-01 — End: 2021-07-25

## 2020-06-01 NOTE — Telephone Encounter (Signed)
Spoke with the pt and made aware of response per Dr Melvyn Novas  She verbalized understanding  Nothing further needed

## 2020-06-01 NOTE — Telephone Encounter (Signed)
I would prefer symbicort 160 2bid  set up appt at 4 weeks (prior to next refill of it) to be sure she's using it properly

## 2020-06-07 ENCOUNTER — Ambulatory Visit (INDEPENDENT_AMBULATORY_CARE_PROVIDER_SITE_OTHER): Payer: Managed Care, Other (non HMO) | Admitting: Podiatry

## 2020-06-07 ENCOUNTER — Encounter: Payer: Managed Care, Other (non HMO) | Admitting: Podiatry

## 2020-06-07 ENCOUNTER — Other Ambulatory Visit: Payer: Self-pay

## 2020-06-07 DIAGNOSIS — M24271 Disorder of ligament, right ankle: Secondary | ICD-10-CM | POA: Diagnosis not present

## 2020-06-07 DIAGNOSIS — M2041 Other hammer toe(s) (acquired), right foot: Secondary | ICD-10-CM

## 2020-06-07 DIAGNOSIS — M79671 Pain in right foot: Secondary | ICD-10-CM

## 2020-06-07 DIAGNOSIS — S93491D Sprain of other ligament of right ankle, subsequent encounter: Secondary | ICD-10-CM | POA: Diagnosis not present

## 2020-06-08 NOTE — Progress Notes (Signed)
  Subjective:  Patient ID: Taylor Wilkinson, female    DOB: 26-Sep-1975,  MRN: [id number]  Chief Complaint  Patient presents with  . Hammer Toe    Surgical planning visit   44 y.o. female presents with the above complaint. History confirmed with patient. Patient's surgery was canceled because she also wanted to discuss repairing an additional toe.  Objective:  Physical Exam: warm, good capillary refill, no trophic changes or ulcerative lesions, normal DP and PT pulses and normal sensory exam. Right Foot: POP right 3rd/4th/5th hammertoe, pain and edema lateral right foot pain about the ATFL with increased ankle inversion noted.  Assessment:   1. Sprain of anterior talofibular ligament of right ankle, subsequent encounter   2. Ligamentous laxity of right ankle   3. Hammer toe of right foot   4. Pain in right foot    Plan:  Patient was evaluated and treated and all questions answered.  Chronic R ATFL Tear -Plan for repair and augmentation of ligament  Hammertoe right 3rd4th/5th toes -Proceed with surgical correction of the hammertoes  -Patient has failed all conservative therapy and wishes to proceed with surgical intervention. All risks, benefits, and alternatives discussed with patient. No guarantees given. Consent reviewed and signed by patient. -Planned procedures: Right foot repair and augmentation of lateral ankle ligament, correction 3rd/4th/5th toes     No follow-ups on file.

## 2020-06-09 ENCOUNTER — Encounter: Payer: Self-pay | Admitting: Podiatry

## 2020-06-09 ENCOUNTER — Other Ambulatory Visit: Payer: Self-pay | Admitting: Podiatry

## 2020-06-09 DIAGNOSIS — S93491D Sprain of other ligament of right ankle, subsequent encounter: Secondary | ICD-10-CM | POA: Diagnosis not present

## 2020-06-09 DIAGNOSIS — M2041 Other hammer toe(s) (acquired), right foot: Secondary | ICD-10-CM | POA: Diagnosis not present

## 2020-06-09 MED ORDER — CEPHALEXIN 500 MG PO CAPS
500.0000 mg | ORAL_CAPSULE | Freq: Two times a day (BID) | ORAL | 0 refills | Status: DC
Start: 2020-06-09 — End: 2021-03-02

## 2020-06-09 MED ORDER — ONDANSETRON HCL 4 MG PO TABS: 4.0000 mg | ORAL_TABLET | Freq: Three times a day (TID) | ORAL | 0 refills | Status: DC | PRN

## 2020-06-09 MED ORDER — OXYCODONE-ACETAMINOPHEN 5-325 MG PO TABS: 1.0000 | ORAL_TABLET | ORAL | 0 refills | Status: DC | PRN

## 2020-06-09 NOTE — Progress Notes (Signed)
Rx sent to pharmacy for outpatient surgery. °

## 2020-06-17 ENCOUNTER — Ambulatory Visit (INDEPENDENT_AMBULATORY_CARE_PROVIDER_SITE_OTHER): Payer: Managed Care, Other (non HMO)

## 2020-06-17 ENCOUNTER — Other Ambulatory Visit: Payer: Self-pay

## 2020-06-17 ENCOUNTER — Ambulatory Visit (INDEPENDENT_AMBULATORY_CARE_PROVIDER_SITE_OTHER): Payer: Managed Care, Other (non HMO) | Admitting: Podiatry

## 2020-06-17 DIAGNOSIS — M2041 Other hammer toe(s) (acquired), right foot: Secondary | ICD-10-CM

## 2020-06-17 MED ORDER — OXYCODONE-ACETAMINOPHEN 10-325 MG PO TABS: 1.0000 | ORAL_TABLET | ORAL | 0 refills | Status: DC | PRN

## 2020-06-21 ENCOUNTER — Encounter: Payer: Managed Care, Other (non HMO) | Admitting: Podiatry

## 2020-06-24 ENCOUNTER — Other Ambulatory Visit: Payer: Self-pay

## 2020-06-24 ENCOUNTER — Ambulatory Visit (INDEPENDENT_AMBULATORY_CARE_PROVIDER_SITE_OTHER): Payer: Managed Care, Other (non HMO) | Admitting: Podiatry

## 2020-06-24 DIAGNOSIS — M24271 Disorder of ligament, right ankle: Secondary | ICD-10-CM

## 2020-06-24 DIAGNOSIS — M2041 Other hammer toe(s) (acquired), right foot: Secondary | ICD-10-CM

## 2020-06-24 DIAGNOSIS — S93491D Sprain of other ligament of right ankle, subsequent encounter: Secondary | ICD-10-CM

## 2020-06-24 MED ORDER — OXYCODONE-ACETAMINOPHEN 10-325 MG PO TABS: 1.0000 | ORAL_TABLET | ORAL | 0 refills | Status: DC | PRN

## 2020-06-28 ENCOUNTER — Other Ambulatory Visit: Payer: Self-pay | Admitting: Podiatry

## 2020-07-05 ENCOUNTER — Ambulatory Visit: Payer: Managed Care, Other (non HMO) | Admitting: Internal Medicine

## 2020-07-08 ENCOUNTER — Ambulatory Visit (INDEPENDENT_AMBULATORY_CARE_PROVIDER_SITE_OTHER): Payer: Managed Care, Other (non HMO)

## 2020-07-08 ENCOUNTER — Ambulatory Visit (INDEPENDENT_AMBULATORY_CARE_PROVIDER_SITE_OTHER): Payer: Managed Care, Other (non HMO) | Admitting: Podiatry

## 2020-07-08 ENCOUNTER — Other Ambulatory Visit: Payer: Self-pay

## 2020-07-08 DIAGNOSIS — M2041 Other hammer toe(s) (acquired), right foot: Secondary | ICD-10-CM

## 2020-07-08 MED ORDER — OXYCODONE-ACETAMINOPHEN 5-325 MG PO TABS: 1.0000 | ORAL_TABLET | ORAL | 0 refills | Status: DC | PRN

## 2020-07-08 NOTE — Progress Notes (Signed)
  Subjective:  Patient ID: Taylor Wilkinson, female    DOB: [birth year],  MRN: [id number]  Chief Complaint  Patient presents with  . Routine Post Op    POV #3 DOS 06/09/2020 ANKLE STABILIZATION RT; HAMMERTOE REPAIR 4TH & 5TH RT    DOS: 06/09/20 Procedure: Right ankle stabilization, hammertoe repairs 3,4,5   44 y.o. female presents with the above complaint. History confirmed with patient.   Objective:  Physical Exam: tenderness at the surgical site, no edema noted, calf supple, nontender and toes rectus. Incision: no significant drainage, no dehiscence, no significant erythema, slow healing  No images are attached to the encounter.  Radiographs: X-ray of the right foot: progressive healing of the hammertoes, some backing out of the 3rd toe pin   Assessment:   1. Hammer toe of right foot     Plan:  Patient was evaluated and treated and all questions answered.  Post-operative State -XR reviewed with patient -Sutures removed -Staples removed -Steri-strips applied to the incision -Ok to start showering at this time. Advised they cannot soak. -WBAT in CAM boot -Pain medication refilled  Return in about 2 weeks (around 07/22/2020) for Post-Op (with XRs).

## 2020-07-10 NOTE — Progress Notes (Signed)
  Subjective:  Patient ID: QUINLYN TEP, female    DOB: [birth year],  MRN: [id number]  Chief Complaint  Patient presents with  . Routine Post Op    POV#1 DOS 9.30.2021 ANKLE STABILIZATION RT; HAMMERTOE REPAIR 4TH AND 5TH RT. Pt states tenderness and concern with straightness of toes.    DOS: 06/09/20 Procedure: Ankle stabilization righ, correction hammertoes 3,4,5 right  44 y.o. female presents with the above complaint. History confirmed with patient.  History above confirmed with patient  Objective:  Physical Exam: tenderness at the surgical site, local edema noted, calf supple, nontender and toes rectus. Incision: healing well, no significant drainage, no dehiscence, no significant erythema  No images are attached to the encounter.  Radiographs: X-ray of the right foot: Consistent with postop state.  There is decreased base between the second third toes  Assessment:   1. Hammer toe of right foot     Plan:  Patient was evaluated and treated and all questions answered.  Post-operative State -XR reviewed with patient -Dressing applied consisting of sterile gauze, kerlix and ACE bandage -NWB with crutches -Pain medication refilled  -The third toe was anesthetized with 3 cc of lidocaine 1% plain and the pin was pulled to past the metatarsophalangeal joint to correct the alignment of the third toe.  Patient tolerated the procedure rather well  Return in about 1 week (around 06/24/2020) for Post-Op (No XRs).

## 2020-07-11 NOTE — Progress Notes (Signed)
  Subjective:  Patient ID: DALYAH PLA, female    DOB: 29-Sep-1975,  MRN: [id number]  No chief complaint on file.   DOS: 06/09/20 Procedure: Ankle stabilization righ, correction hammertoes 3,4,5 right  44 y.o. female presents with the above complaint. History confirmed with patient.  History above confirmed with patient  Objective:  Physical Exam: tenderness at the surgical site, local edema noted, calf supple, nontender and toes rectus. Incision: healing well, no significant drainage, no dehiscence, no significant erythema  No images are attached to the encounter.  Assessment:   1. Hammer toe of right foot   2. Sprain of anterior talofibular ligament of right ankle, subsequent encounter   3. Ligamentous laxity of right ankle     Plan:  Patient was evaluated and treated and all questions answered.  Post-operative State -Dressing applied consisting of sterile gauze, kerlix and ACE bandage -NWB with crutches -Pain medication refilled  -No suture removal today plan for suture removal next week  No follow-ups on file.

## 2020-07-20 ENCOUNTER — Ambulatory Visit: Payer: Managed Care, Other (non HMO) | Admitting: Family Medicine

## 2020-07-20 NOTE — Progress Notes (Deleted)
   Subjective:   Patient ID: Taylor Wilkinson    DOB: [birth year], 44 y.o. female   MRN: [id number]  Taylor Wilkinson is a 44 y.o. female with a history of *** here for vaginal itching   Vaginal Itching:  Requests STD testig?  Review of Systems:  Per HPI.   Objective:   There were no vitals taken for this visit. Vitals and nursing note reviewed.  General: pleasant ***, sitting comfortably in exam chair, well nourished, well developed, in no acute distress with non-toxic appearance HEENT: normocephalic, atraumatic, moist mucous membranes, oropharynx clear without erythema or exudate, TM normal bilaterally  Neck: supple, non-tender without lymphadenopathy CV: regular rate and rhythm without murmurs, rubs, or gallops, no lower extremity edema, 2+ radial and pedal pulses bilaterally Lungs: clear to auscultation bilaterally with normal work of breathing on room air Resp: breathing comfortably on room air, speaking in full sentences Abdomen: soft, non-tender, non-distended, no masses or organomegaly palpable, normoactive bowel sounds Skin: warm, dry, no rashes or lesions Extremities: warm and well perfused, normal tone MSK: ROM grossly intact, strength intact, gait normal Neuro: Alert and oriented, speech normal  Assessment & Plan:   No problem-specific Assessment & Plan notes found for this encounter.  No orders of the defined types were placed in this encounter.  No orders of the defined types were placed in this encounter.   Taylor Marble, DO PGY-3, [facility] 07/20/2020 8:27 AM

## 2020-07-23 ENCOUNTER — Encounter: Payer: Self-pay | Admitting: Podiatry

## 2020-07-23 ENCOUNTER — Ambulatory Visit (INDEPENDENT_AMBULATORY_CARE_PROVIDER_SITE_OTHER): Payer: Managed Care, Other (non HMO)

## 2020-07-23 ENCOUNTER — Other Ambulatory Visit: Payer: Self-pay

## 2020-07-23 ENCOUNTER — Ambulatory Visit (INDEPENDENT_AMBULATORY_CARE_PROVIDER_SITE_OTHER): Payer: Managed Care, Other (non HMO) | Admitting: Podiatry

## 2020-07-23 DIAGNOSIS — M2041 Other hammer toe(s) (acquired), right foot: Secondary | ICD-10-CM

## 2020-07-23 DIAGNOSIS — S93491D Sprain of other ligament of right ankle, subsequent encounter: Secondary | ICD-10-CM

## 2020-08-10 NOTE — Progress Notes (Signed)
  Subjective:  Patient ID: Taylor Wilkinson, female    DOB: 04-27-1976,  MRN: [id number]  Chief Complaint  Patient presents with  . Routine Post Op    the right foot is doing ok and ready to get out of the boot    DOS: 06/09/20 Procedure: Right ankle stabilization, hammertoe repairs 3,4,5   44 y.o. female presents with the above complaint. History confirmed with patient.   Objective:  Physical Exam: tenderness at the surgical site, no edema noted, calf supple, nontender and toes rectus. Incision: no significant drainage, no dehiscence, no significant erythema, slow healing  No images are attached to the encounter.  Radiographs: X-ray of the right foot: progressive healing of the hammertoes  Assessment:   1. Hammer toe of right foot   2. Sprain of anterior talofibular ligament of right ankle, subsequent encounter     Plan:  Patient was evaluated and treated and all questions answered.  Post-operative State -WBAT in Surgical shoe -Surgical shoe dispensed -Trilock brace dispensed.  Return in about 3 weeks (around 08/13/2020).

## 2020-08-12 ENCOUNTER — Ambulatory Visit (INDEPENDENT_AMBULATORY_CARE_PROVIDER_SITE_OTHER): Payer: Managed Care, Other (non HMO) | Admitting: Podiatry

## 2020-08-12 ENCOUNTER — Other Ambulatory Visit: Payer: Self-pay

## 2020-08-12 ENCOUNTER — Ambulatory Visit (INDEPENDENT_AMBULATORY_CARE_PROVIDER_SITE_OTHER): Payer: Managed Care, Other (non HMO)

## 2020-08-12 ENCOUNTER — Encounter: Payer: Self-pay | Admitting: Podiatry

## 2020-08-12 DIAGNOSIS — M2041 Other hammer toe(s) (acquired), right foot: Secondary | ICD-10-CM

## 2020-08-12 MED ORDER — OXYCODONE-ACETAMINOPHEN 5-325 MG PO TABS: 1.0000 | ORAL_TABLET | ORAL | 0 refills | Status: DC | PRN

## 2020-08-12 NOTE — Progress Notes (Signed)
  Subjective:  Patient ID: Taylor Wilkinson, female    DOB: [birth year],  MRN: [id number]  Chief Complaint  Patient presents with  . Routine Post Op    Pt states pain swelling and pus drainage at right lateral ankle, 1 week duration. Pt states numbness on plantar/medial aspect.   DOS: 06/09/20 Procedure: Right ankle stabilization, hammertoe repairs 3,4,5   44 y.o. female presents with the above complaint. History confirmed with patient.   Objective:  Physical Exam: Tenderness lateral ankle, toes rectus with edema, mild POP.  Incision: small stitch abscess lateral ankle without overt infection. Other incisions well healed.  No images are attached to the encounter.  Radiographs: X-ray of the right foot: hammertoes appear well healed, rectus.  Assessment:   1. Hammer toe of right foot    Plan:  Patient was evaluated and treated and all questions answered.  Post-operative State -XR reviewed with patient -Transition to sneaker and ankle brace -Start PT. Order placed. Ankle stabilization exercises  Stitch abscess -No sign of acute infection -Deep stitch removed along with scant purulent material. Cleansed with alcohol, neosporin and band-aid applied.  Neruopraxia -2/2 bandaging, should resolve with time.  Return in about 4 weeks (around 09/09/2020) for Post-Op (No XRs).

## 2020-08-30 ENCOUNTER — Ambulatory Visit: Payer: Managed Care, Other (non HMO) | Attending: Podiatry

## 2020-09-06 ENCOUNTER — Ambulatory Visit (INDEPENDENT_AMBULATORY_CARE_PROVIDER_SITE_OTHER): Payer: Managed Care, Other (non HMO) | Admitting: Podiatry

## 2020-09-06 ENCOUNTER — Other Ambulatory Visit: Payer: Self-pay

## 2020-09-06 ENCOUNTER — Other Ambulatory Visit: Payer: Self-pay | Admitting: Podiatry

## 2020-09-06 ENCOUNTER — Encounter: Payer: Self-pay | Admitting: Podiatry

## 2020-09-06 DIAGNOSIS — M2041 Other hammer toe(s) (acquired), right foot: Secondary | ICD-10-CM

## 2020-09-06 DIAGNOSIS — M24271 Disorder of ligament, right ankle: Secondary | ICD-10-CM

## 2020-09-06 DIAGNOSIS — Z9889 Other specified postprocedural states: Secondary | ICD-10-CM

## 2020-09-06 DIAGNOSIS — S93491D Sprain of other ligament of right ankle, subsequent encounter: Secondary | ICD-10-CM

## 2020-09-06 NOTE — Progress Notes (Signed)
  Subjective:  Patient ID: Taylor Wilkinson, female    DOB: [birth year],  MRN: [id number]  Chief Complaint  Patient presents with  . Hammer Toe    Patient reports having some throbbing pain in the 3rd toe right foot. Has not started PT yet d/t holiday plans but will call to reschedule appt. Denies N/V, fever, chills.    DOS: 06/09/20 Procedure: Right ankle stabilization, hammertoe repairs 3,4,5   44 y.o. female presents with the above complaint. History confirmed with patient.   Objective:  Physical Exam: Tenderness lateral ankle, toes rectus with edema but without pain to palpation Incision: Incisions well-healed  Assessment:   1. Hammer toe of right foot   2. Sprain of anterior talofibular ligament of right ankle, subsequent encounter   3. Ligamentous laxity of right ankle   4. Post-operative state    Plan:  Patient was evaluated and treated and all questions answered.  Post-operative State -XR reviewed with patient -Continue sneaker ankle brace.  She has not started physical therapy so she is behind on this.  I think we will keep her out of work for an additional month and plan back to work at that time.  Discussed wrapping toes in[place] for reduction of edema  Neruopraxia -Improving.  No follow-ups on file.

## 2020-09-06 NOTE — Telephone Encounter (Signed)
Please advise 

## 2020-09-13 ENCOUNTER — Other Ambulatory Visit: Payer: Self-pay | Admitting: Internal Medicine

## 2020-09-13 DIAGNOSIS — Z20822 Contact with and (suspected) exposure to covid-19: Secondary | ICD-10-CM | POA: Diagnosis not present

## 2020-09-13 MED ORDER — BREO ELLIPTA 100-25 MCG/INH IN AEPB: 1.0000 | INHALATION_SPRAY | Freq: Every day | RESPIRATORY_TRACT | 11 refills | Status: DC

## 2020-09-27 ENCOUNTER — Ambulatory Visit: Payer: Medicaid Other

## 2020-09-27 ENCOUNTER — Ambulatory Visit (INDEPENDENT_AMBULATORY_CARE_PROVIDER_SITE_OTHER): Payer: Medicaid Other | Admitting: Podiatry

## 2020-09-27 ENCOUNTER — Ambulatory Visit: Payer: Medicaid Other | Attending: Podiatry

## 2020-09-27 ENCOUNTER — Encounter: Payer: Self-pay | Admitting: Podiatry

## 2020-09-27 ENCOUNTER — Other Ambulatory Visit: Payer: Self-pay

## 2020-09-27 DIAGNOSIS — S93491S Sprain of other ligament of right ankle, sequela: Secondary | ICD-10-CM | POA: Diagnosis present

## 2020-09-27 DIAGNOSIS — B353 Tinea pedis: Secondary | ICD-10-CM

## 2020-09-27 DIAGNOSIS — M25371 Other instability, right ankle: Secondary | ICD-10-CM | POA: Insufficient documentation

## 2020-09-27 DIAGNOSIS — M6281 Muscle weakness (generalized): Secondary | ICD-10-CM | POA: Diagnosis present

## 2020-09-27 DIAGNOSIS — Z9889 Other specified postprocedural states: Secondary | ICD-10-CM | POA: Insufficient documentation

## 2020-09-27 DIAGNOSIS — M2041 Other hammer toe(s) (acquired), right foot: Secondary | ICD-10-CM | POA: Insufficient documentation

## 2020-09-27 DIAGNOSIS — S93491D Sprain of other ligament of right ankle, subsequent encounter: Secondary | ICD-10-CM

## 2020-09-27 MED ORDER — KETOCONAZOLE 2 % EX CREA: TOPICAL_CREAM | CUTANEOUS | 0 refills | Status: DC

## 2020-09-27 NOTE — Progress Notes (Signed)
  Subjective:  Patient ID: Taylor Wilkinson, female    DOB: [birth year],  MRN: [id number]  Chief Complaint  Patient presents with  . Routine Post Op    POV -per pt no pain but with numbness and tingling at hallux -with itching at Rt lateral ankle -pt states she started having nausea since Sat. After booster shot -pt denies V/F/Ch Tx: ankle brace and IBU    DOS: 06/09/20 Procedure: Right ankle stabilization, hammertoe repairs 3,4,5   45 y.o. female presents with the above complaint. History confirmed with patient.   Objective:  Physical Exam: No tenderness lateral ankle, toes rectus with sant edema, no pain to palpation Incision: Incisions well-healed. Xerosis with scaling to hte plantar foot bilat.  Assessment:   1. Tinea pedis of right foot   2. Hammer toe of right foot   3. Sprain of anterior talofibular ligament of right ankle, subsequent encounter    Plan:  Patient was evaluated and treated and all questions answered.  Post-operative State -Doing very well.  -Plan for return to work in 2-3 weeks. -Continue normal shoegear -Continue PT  Neruopraxia -Still present  Tinea Pedis -Rx ketoconazole  Return in about 6 weeks (around 11/08/2020) for Ankle instability f/u.

## 2020-09-28 NOTE — Therapy (Signed)
[place], [place], [address] Phone: [phone]   Fax:  [phone]  Physical Therapy Evaluation  Patient Details  Name: Taylor Wilkinson MRN: [id number] Date of Birth: August 20, 1976 Referring Provider (PT): Evelina Bucy, DPM   Encounter Date: 09/27/2020   PT End of Session - 09/27/20 1327    Visit Number 1    Number of Visits 7    Date for PT Re-Evaluation 11/18/20    Authorization Type [place] MEDICAID [employer]    Progress Note Due on Visit 7    PT Start Time 1130    PT Stop Time 1225    PT Time Calculation (min) 55 min    Activity Tolerance Patient tolerated treatment well    Behavior During Therapy [facility] for tasks assessed/performed           Past Medical History:  Diagnosis Date  . Asthma   . Bronchitis   . Miscarriage     Past Surgical History:  Procedure Laterality Date  . NO PAST SURGERIES      There were no vitals filed for this visit.    Subjective Assessment - 09/27/20 1318    Subjective Pt reports having suregry to repair a ligament in her R ankle and to correct the hammer toes of her 3d-5th toes. Pt denies pain, but has numbess of her great toe. Pt has concern about the stability of her R ankle and has been wearing an ankle brace most of the time since the walking boot was DCed. Pt states she probably injuried her R ankle cheering, playing sports and being on dance teams when younger.    Limitations Other (comment)   running, wearing heals   Patient Stated Goals To have good stability of the R ankle. To feel confident with walking and jogging without the ankle brace. To be able to wear heals.    Currently in Pain? No/denies              [facility] PT Assessment - 09/28/20 0001      Assessment   Medical Diagnosis Hammer toes 3rd-5th; s/p ant talofibular lig repair    Referring Provider (PT) Evelina Bucy, DPM    Onset Date/Surgical Date 06/09/20    Hand  Dominance Left    Next MD Visit 11/08/20    Prior Therapy No      Precautions   Precautions None      Restrictions   Weight Bearing Restrictions No      Balance Screen   Has the patient fallen in the past 6 months No      [place] residence    Living Arrangements Spouse/significant other;Children    Type of [place] Access Level entry    [place] None      Prior Function   Level of Independence Independent    Vocation Other (comment)   leave of absence from work till Oct 12, 2020   Vocation Requirements Sitting or standing; scanning items      Cognition   Overall Cognitive Status Within Functional Limits for tasks assessed      Observation/Other Assessments   Observations [place] swelling of the r 3rd-5th toes    Focus on Therapeutic Outcomes (FOTO)  NA      Sensation   Light Touch Impaired by gross assessment   decreased sensation to LT for the  R great toe     Posture/Postural Control   Posture Comments high arch, min varus R great toe      ROM / Strength   AROM / PROM / Strength AROM;Strength      AROM   Overall AROM Comments R toe ext is WFLs. R toe flex is decreased 3rd-4th PIPs s/p pin placement    AROM Assessment Site Ankle    Right/Left Ankle Right;Left    Right Ankle Dorsiflexion 12    Right Ankle Plantar Flexion 50    Right Ankle Inversion 47    Right Ankle Eversion 32    Left Ankle Dorsiflexion 15    Left Ankle Plantar Flexion 51    Left Ankle Inversion 45    Left Ankle Eversion 31      Strength   Overall Strength Comments 5/5 strength for the R ankle; pt expresses concern for re: stability      Transfers   Transfers Sit to Stand;Stand to Sit    Sit to Stand 7: Independent      Ambulation/Gait   Ambulation/Gait Yes    Ambulation/Gait Assistance 7: Independent    Gait Pattern Within Functional Limits;Step-through pattern      High Level Balance   High Level Balance  Comments SLS R=12, 8 sec ave 10 sec; L 30+ sec                      Objective measurements completed on examination: See above findings.       [place] Adult PT Treatment/Exercise - 09/28/20 0001      Exercises   Exercises Ankle      Ankle Exercises: Standing   SLS 3x; 20 sec    Heel Raises Both;20 reps    Toe Raise 20 reps   both     Ankle Exercises: Seated   Towel Crunch 5 reps    [place] Pickup Reviewed    Other Seated Ankle Exercises Ankle 4 way strengthening c Red Tband; 10x each                  PT Education - 09/27/20 1327    Education Details Eval findings, POC, HEP    Person(s) Educated Patient    Methods Explanation;Demonstration;Tactile cues;Verbal cues;Other (comment);Handout    Comprehension Verbalized understanding;Returned demonstration;Verbal cues required;Tactile cues required;Need further instruction            PT Short Term Goals - 09/28/20 0933      PT SHORT TERM GOAL #1   Title Pt will be Ind in a HEP    Baseline Started on eval    Status New    Target Date 10/19/20             PT Long Term Goals - 09/28/20 0934      PT LONG TERM GOAL #1   Title Pt will be able to single leg stand for 20+ sec. for improved balance and stability of the R ankle    Status New    Target Date 11/18/20      PT LONG TERM GOAL #2   Title Pt will be able to jog for 2 mins without an ankle brace for good tolerance and without issue of stability c the the R ankle to initiate a walking/jogging ex program.    Status New    Target Date 11/18/20      PT LONG TERM GOAL #3   Title Pt will demonstrate 5/% strength of the  R ankle, knee and hip to optimize functional use of the R LE    Status New    Target Date 11/18/20      PT LONG TERM GOAL #4   Title Pt will be Ind in a final HEP to maintain or progress achieved LOF    Status New    Target Date 11/18/20                  Plan - 09/27/20 1329    Clinical Impression Statement Pt  presents to PT s/p surgey, 9/30. for 3rd, 4th, 5th ray hammer toe surgery and for ant. talofibular ligament repair. Pt demonstrates decreased balance with single leg standing R vs. L and probable decreased strength, although MMT did not reveal weakness. Following pin placement c hammer toe surgery, flexion of the 3rd, 4th, and 5th DIP jts is limited as anticipated and swelling is slightly increased vs. L toes. Pt demonstrated a normal gait pattern with pt reporting stability with wearing boots related to snowy weather. Pt also reports wearing an ankle brace as needed. A HEP was strated to address the strength of the R toes and ankle and R single leg balance. Pt returned demonstration and a written HEP was provided. Pt will benefit from PT 1w6 for strengthening, balance, and plyometric exs to optimize the stability and functional use of the R LE/ankle/foot.    Personal Factors and Comorbidities Time since onset of injury/illness/exacerbation;Comorbidity 1    Comorbidities BMI    Examination-Activity Limitations Locomotion Level    Examination-Participation Restrictions Other   Recreation   Stability/Clinical Decision Making Stable/Uncomplicated    Clinical Decision Making Low    Rehab Potential Good    PT Frequency 1x / week    PT Duration 6 weeks    PT Treatment/Interventions ADLs/Self Care Home Management;Cryotherapy;Electrical Stimulation;Iontophoresis 4mg /ml Dexamethasone;Moist Heat;Balance training;Therapeutic exercise;Therapeutic activities;Functional mobility training;Stair training;Gait training;Patient/family education;Manual techniques;Passive range of motion;Taping    PT Next Visit Plan Assess response to HEP, progress ther ex for strengthening and balance as indicated, assess r knee and hip strength.    PT Home Exercise Plan [id number]           Patient will benefit from skilled therapeutic intervention in order to improve the following deficits and impairments:  Difficulty  walking,Decreased strength,Impaired sensation,Decreased balance  Visit Diagnosis: Hammer toe of right foot  Sprain of anterior talofibular ligament of right ankle, sequela  S/P ankle ligament repair  Muscle weakness (generalized)  Ankle instability, right     Problem List Patient Active Problem List   Diagnosis Date Noted  . Rash and nonspecific skin eruption 08/05/2019  . Encounter for screening for cervical cancer 06/09/2019  . Candidal intertrigo 06/09/2019  . Moderate persistent asthma without complication 44/09/270  . Cigarette smoker 02/13/2018  . Shortness of breath 02/06/2018  . Chronic urticaria 02/06/2018  . Tachycardia 01/04/2013    Gar Ponto MS, PT 09/28/20 9:45 AM  [facility] [address] [place], [place], [address] Phone: [phone]   Fax:  [phone]  Name: SHAUN ZUCCARO MRN: [id number] Date of Birth: 18-Apr-1976   Check all possible CPT codes: 06301- Therapeutic Exercise, [phone] - Gait Training, [phone] - Manual Therapy, 73220 - Therapeutic Activities, [phone] - [place], [address] - Electrical stimulation (unattended), [id number] - Electrical stimulation (Manual), [id number] - Iontophoresis, [id number] - Ultrasound and [id number] - [place]

## 2020-09-29 ENCOUNTER — Ambulatory Visit (INDEPENDENT_AMBULATORY_CARE_PROVIDER_SITE_OTHER): Payer: Medicaid Other | Admitting: Family Medicine

## 2020-09-29 ENCOUNTER — Other Ambulatory Visit: Payer: Self-pay

## 2020-09-29 VITALS — BP 150/88 | HR 105

## 2020-09-29 DIAGNOSIS — J4541 Moderate persistent asthma with (acute) exacerbation: Secondary | ICD-10-CM

## 2020-09-29 DIAGNOSIS — R21 Rash and other nonspecific skin eruption: Secondary | ICD-10-CM | POA: Diagnosis not present

## 2020-09-29 DIAGNOSIS — J4531 Mild persistent asthma with (acute) exacerbation: Secondary | ICD-10-CM | POA: Diagnosis not present

## 2020-09-29 MED ORDER — PREDNISONE 20 MG PO TABS: 40.0000 mg | ORAL_TABLET | Freq: Every day | ORAL | 0 refills | Status: AC

## 2020-09-29 MED ORDER — IPRATROPIUM-ALBUTEROL 0.5-2.5 (3) MG/3ML IN SOLN: 3.0000 mL | RESPIRATORY_TRACT | 0 refills | Status: DC | PRN

## 2020-09-29 MED ORDER — TRIAMCINOLONE ACETONIDE 0.1 % EX OINT: TOPICAL_OINTMENT | CUTANEOUS | 1 refills | Status: DC

## 2020-09-29 NOTE — Progress Notes (Signed)
    SUBJECTIVE:   CHIEF COMPLAINT / HPI: SOB  Taylor Wilkinson is a 45 year old female with a history of tobacco use and moderate persistent asthma presenting for evaluation of dyspnea on exertion.  She reports she initially noticed shortness of breath with activity starting approximately 1 week ago.  Mainly notices with activities, not at rest.  She has also been wheezing a lot more, using her albuterol nebulizer every 4 hours with some relief.  Additionally states she has been out of her Breo daily inhaler for about the last week as well as she was told her insurance would no longer cover this.  Still using her Symbicort as needed, now has been daily.  She denies any associated cough, fever, fatigue, sore throat, chest pain, change in taste/smell, or leg swelling.  She otherwise has felt like herself.  Of note during conversation she reports that her 37 year old daughter tested positive for COVID last Friday, however has been asymptomatic.  She does live with her daughter, states her daughter has been quarantining in her room since diagnosis.  She had her first COVID-vaccine last Friday.  Previously smoked black and milds, quit 28 days ago.  Still smokes THC.  Called her pharmacy during visit-- reported they were under the impression she had two insurances (she previously did but now the other is no longer active), so medicaid was no longer covering her breo. Instructed to call medicaid to clarify this.   PERTINENT  PMH / PSH: Mild persistent asthma, previous tobacco use, THC use  OBJECTIVE:   BP (!) 150/88   Pulse (!) 105   SpO2 96%   General: Alert, NAD HEENT: NCAT, MMM Cardiac: Tachycardic with regular rhythm, no murmur appreciated Lungs: Diffuse expiratory wheeze throughout all lung fields with prolonged expiration, no increased work of breathing noted at rest or walking around room.  No accessory muscle use or tachypnea present. Abdomen: soft Msk: Moves all extremities spontaneously   Ext: Warm, dry, 2+ distal pulses  Ambulated with pulse ox in room with CNA.  At rest 98%, after walking for few minutes went to 96% at lowest.  ASSESSMENT/PLAN:   Asthma in adult, moderate persistent, with acute exacerbation Reassuringly breathing comfortably with appropriate oxygen saturations on RA, however diffusely wheezing with DOE. Unclear trigger, may be multifactorial with considerations for medication change (stopped her breo), underlying COVID-19 (recent exposure to (+) daughter), +/- weather changes. Rx'd prednisone 40mg  x5 days and given sample of Breo while awaiting clarification as it may take some time through medicaid. Schedule albuterol nebs 24-48 hrs. Swabbed for COVID, stay at home through results.      ED precautions discussed, including difficulty breathing or chest pain. F/u pending results, esp if not improving or worsening.   Taylor Wilkinson, [place]

## 2020-09-29 NOTE — Patient Instructions (Addendum)
Wonderful to see you! Please let us know if you have trouble with getting your breo--we have samples to use if you can not get it by next week. Start the steroid to help with your breathing, take your albuterol every 4-6 hours scheduled for the next 24-48 hours and then decrease as tolerated.   We have tested you for covid today, please stay at home until this results in the next 2-3 days. GO to the ED if your difficulty breathing becomes more severe.

## 2020-09-30 ENCOUNTER — Other Ambulatory Visit: Payer: Self-pay | Admitting: Family Medicine

## 2020-09-30 ENCOUNTER — Telehealth: Payer: Self-pay

## 2020-09-30 ENCOUNTER — Encounter: Payer: Self-pay | Admitting: Family Medicine

## 2020-09-30 DIAGNOSIS — J4541 Moderate persistent asthma with (acute) exacerbation: Secondary | ICD-10-CM

## 2020-09-30 MED ORDER — BREO ELLIPTA 100-25 MCG/INH IN AEPB: 1.0000 | INHALATION_SPRAY | Freq: Every day | RESPIRATORY_TRACT | 0 refills | Status: DC

## 2020-09-30 NOTE — Telephone Encounter (Signed)
Received fax from pharmacy, PA needed on Breo. Clinical questions submitted via Cover My Meds.  Waiting on response, could take up to 72 hours.  Cover My Meds info: Key: [id number]

## 2020-09-30 NOTE — Telephone Encounter (Signed)
Patient calls nurse line stating she needs a prior [place] for [facility]. I called the pharmacy to get her insurance information, however the PA is needed on the medication Dr. Melvyn Novas sent in on 09/13/2020. I can not do a PA for another office. If you send in the prescription I can do one. [facility].   Do we need to provide her with a sample in the meantime? Or did she she get one yesterday? The PA will take a few days.

## 2020-09-30 NOTE — Assessment & Plan Note (Signed)
Reassuringly breathing comfortably with appropriate oxygen saturations on RA, however diffusely wheezing with DOE. Unclear trigger, may be multifactorial with considerations for medication change (stopped her breo), underlying COVID-19 (recent exposure to (+) daughter), +/- weather changes. Rx'd prednisone 40mg  x5 days and given sample of Breo while awaiting clarification as it may take some time through [place]. Schedule albuterol nebs 24-48 hrs. Swabbed for COVID, stay at home through results.

## 2020-09-30 NOTE — Telephone Encounter (Signed)
It looks like Dr. Higinio Plan gave her a sample yesterday.   [facility] so we can start the PA.  Thank you for your help!  Let me know if you need anything else!

## 2020-10-01 LAB — NOVEL CORONAVIRUS, NAA: SARS-CoV-2, NAA: NOT DETECTED

## 2020-10-01 LAB — SARS-COV-2, NAA 2 DAY TAT

## 2020-10-03 ENCOUNTER — Encounter: Payer: Self-pay | Admitting: Family Medicine

## 2020-10-11 NOTE — Telephone Encounter (Signed)
Medication was denied due to patient having 2 insurances. I attempted to call patient to get more information on this, however I had to LVM.

## 2020-10-12 ENCOUNTER — Telehealth: Payer: Self-pay | Admitting: *Deleted

## 2020-10-12 ENCOUNTER — Encounter: Payer: Self-pay | Admitting: Podiatry

## 2020-10-12 ENCOUNTER — Telehealth: Payer: Self-pay | Admitting: Podiatry

## 2020-10-12 ENCOUNTER — Telehealth: Payer: Self-pay | Admitting: Internal Medicine

## 2020-10-12 NOTE — Telephone Encounter (Signed)
Patient would like a letter to remain out of work? Please advise? She is suppose to return to work 10/12/2020

## 2020-10-12 NOTE — Telephone Encounter (Signed)
Patient is requesting a 2 week out of work extension note for her job. Please advise.

## 2020-10-12 NOTE — Telephone Encounter (Signed)
We can extend by 2 weeks

## 2020-10-12 NOTE — Telephone Encounter (Signed)
We can do that

## 2020-10-12 NOTE — Telephone Encounter (Signed)
Called and spoke with pt who stated her Breo inhaler was requiring a PA. Stated to pt that we would get PA taken care of.  Tried initiating PA via CMM and received a response stating that pt needs to use her primary insurance prior to able to use her [employer] and due to this the PA outcome was unknown.  Called pt's pharmacy and stated that pt called [place] and told us that PA was needing to be done for her Breo inhaler but they said when they tried running it, they received the same response. They said that pt would need to contact medicaid if pt only has one insurance to have the issue fixed.  Called and spoke with pt letting her know this info and she said that she had contacted medicaid and thought it had been fixed with them.pt said she would contact pharmacy. Nothing further needed.

## 2020-10-12 NOTE — Telephone Encounter (Signed)
Called and informed patient that the 2 week  extension was approved by Dr March Rummage and that she may pick up letter from  front desk at her convenience.

## 2020-10-17 ENCOUNTER — Ambulatory Visit: Payer: Medicaid Other | Admitting: Family Medicine

## 2020-10-18 ENCOUNTER — Ambulatory Visit: Payer: 59 | Attending: Podiatry

## 2020-10-21 ENCOUNTER — Telehealth: Payer: Self-pay

## 2020-10-21 MED ORDER — BREO ELLIPTA 100-25 MCG/INH IN AEPB: INHALATION_SPRAY | RESPIRATORY_TRACT | 0 refills | Status: DC

## 2020-10-21 NOTE — Telephone Encounter (Signed)
Patient calls nurse line in regards to [facility]. In previous office notes and PA notes she has two insurances. Therefore, the PA was denied. Patient became very aggressive using foul language towards me. I tried to explain the situation to her and to see if she has cleared up the dual insurance issue. Patient continued to use foul language and when I asked her to stop so we could resolve the issue you just kept on. I ended the phone call. Patient has LVM screaming at me over the phone continuing to use foul language (I have saved this.)  I have no further interest in communicating with her. She is continuing to call. Will forward to Engineer, building services and [place].

## 2020-10-21 NOTE — Telephone Encounter (Signed)
Pt walks into office demanding to speak with the nurse.  She is loud and belligerent in the front office, I pulled her to the back and attempted to find out the issue.  She is upset about her [place] ( see phone note from 09/30/20).  I was able to calm patient down in isolated exam room.  Explained to her that kind of behavior would not be tolerated.  Explained that I listened to the VM and how it was unacceptable to use that language or "act out" in that manner.  Advised that if this behavior is to happen again, she could be dismissed from our practice.  Pt expresses understanding and is apologetic.  She ask that I apologize to the nurse for her.  Letter created (per policy) and certified mailed to address in chart.   Christen Bame, CMA

## 2020-10-21 NOTE — Addendum Note (Signed)
Addended by: Christen Bame D on: 10/21/2020 11:13 AM   Modules accepted: Orders

## 2020-10-21 NOTE — Telephone Encounter (Signed)
Dent to figure out next steps.  They are still showing that she has [place].  Patient will need to call patient services @ [phone] and let them know that she no longer has [place].  Once they remove [employer] then the PA can be submitted again.  Breo samples (one month) given in the meantime.  LMOVM for pt to call me back.  Christen Bame, CMA

## 2020-10-25 ENCOUNTER — Ambulatory Visit: Payer: 59

## 2020-10-27 ENCOUNTER — Telehealth: Payer: Self-pay

## 2020-10-27 NOTE — Telephone Encounter (Signed)
Pt reports her no show visits have been related to the death of her brother. She states wanting to return to PT, but not sure when. Maybe in a couple of weeks. Current appts will be DCed, and pt is to call in to schedule when she is ready to re-start.

## 2020-10-28 ENCOUNTER — Other Ambulatory Visit: Payer: Self-pay | Admitting: Podiatry

## 2020-10-30 NOTE — Telephone Encounter (Signed)
Please advise 

## 2020-10-31 ENCOUNTER — Telehealth: Payer: Self-pay

## 2020-10-31 NOTE — Telephone Encounter (Signed)
Pt would like a call from Dr. March Rummage. She needs a week off from work, pt states that her brother was just killed. Pt needs time to think. Please advise.

## 2020-11-01 ENCOUNTER — Ambulatory Visit: Payer: 59

## 2020-11-01 NOTE — Telephone Encounter (Signed)
LMOVM again. Christen Bame, CMA

## 2020-11-04 ENCOUNTER — Encounter: Payer: Self-pay | Admitting: Podiatry

## 2020-11-08 ENCOUNTER — Other Ambulatory Visit: Payer: Self-pay

## 2020-11-08 ENCOUNTER — Encounter: Payer: Self-pay | Admitting: Podiatry

## 2020-11-08 ENCOUNTER — Ambulatory Visit (INDEPENDENT_AMBULATORY_CARE_PROVIDER_SITE_OTHER): Payer: Medicaid Other | Admitting: Podiatry

## 2020-11-08 DIAGNOSIS — M2041 Other hammer toe(s) (acquired), right foot: Secondary | ICD-10-CM | POA: Diagnosis not present

## 2020-11-08 DIAGNOSIS — S93491D Sprain of other ligament of right ankle, subsequent encounter: Secondary | ICD-10-CM

## 2020-11-08 NOTE — Progress Notes (Signed)
  Subjective:  Patient ID: Taylor Wilkinson, female    DOB: [birth year],  MRN: [id number]  Chief Complaint  Patient presents with  . Ankle Pain    F/U Rt ankle pain -pt stated,' when I work on it after 6 hrs I get limping. Pain radiates to shin." - worse with working no swelling Tx: stretching    DOS: 06/09/20 Procedure: Right ankle stabilization, hammertoe repairs 3,4,5   45 y.o. female presents with the above complaint. History confirmed with patient.   Objective:  Physical Exam: No tenderness lateral ankle, toes rectus with sant edema, no pain to palpation Incision: Incisions well-healed. Xerosis with scaling to hte plantar foot bilat.  Assessment:   1. Hammer toe of right foot   2. Sprain of anterior talofibular ligament of right ankle, subsequent encounter    Plan:  Patient was evaluated and treated and all questions answered.  Post-operative State -Doing very well. She has some pain after 6 hours at work which is not unexpected given the time she has been out. She is otherwise doing very well. I think she will make full recovery with some temporary work restrictions. Will cut shifts to 6 hours, allowing frequent breaks and no continuous standing >3 hours.   No follow-ups on file.

## 2020-11-09 ENCOUNTER — Ambulatory Visit: Payer: Managed Care, Other (non HMO)

## 2020-11-10 NOTE — Telephone Encounter (Signed)
Patient was seen yesterday and issues addressed. I was out of the office at the time of her original call.Marland Kitchen

## 2020-11-18 ENCOUNTER — Other Ambulatory Visit: Payer: Self-pay | Admitting: Family Medicine

## 2020-11-24 ENCOUNTER — Ambulatory Visit: Payer: Managed Care, Other (non HMO) | Attending: Podiatry

## 2020-11-24 ENCOUNTER — Other Ambulatory Visit: Payer: Self-pay

## 2020-11-24 DIAGNOSIS — Z9889 Other specified postprocedural states: Secondary | ICD-10-CM | POA: Diagnosis present

## 2020-11-24 DIAGNOSIS — S93491S Sprain of other ligament of right ankle, sequela: Secondary | ICD-10-CM | POA: Insufficient documentation

## 2020-11-24 DIAGNOSIS — M2041 Other hammer toe(s) (acquired), right foot: Secondary | ICD-10-CM | POA: Diagnosis not present

## 2020-11-24 DIAGNOSIS — M6281 Muscle weakness (generalized): Secondary | ICD-10-CM | POA: Insufficient documentation

## 2020-11-24 DIAGNOSIS — M25371 Other instability, right ankle: Secondary | ICD-10-CM | POA: Diagnosis present

## 2020-11-24 NOTE — Therapy (Addendum)
[place], [place], [address] Phone: [phone]   Fax:  [phone]  Physical Therapy Treatment/Discharge  Patient Details  Name: Taylor Wilkinson MRN: [id number] Date of Birth: 09-30-1975 Referring Provider (PT): Evelina Bucy, DPM   Encounter Date: 11/24/2020   PT End of Session - 11/24/20 1723     Visit Number 2    Number of Visits 6    Date for PT Re-Evaluation 01/05/21    PT Start Time 1620    PT Stop Time 1705    PT Time Calculation (min) 45 min    Activity Tolerance Patient tolerated treatment well    Behavior During Therapy [facility] for tasks assessed/performed             Past Medical History:  Diagnosis Date   Asthma    Bronchitis    Miscarriage     Past Surgical History:  Procedure Laterality Date   NO PAST SURGERIES      There were no vitals filed for this visit.   Subjective Assessment - 11/24/20 1715     Subjective Pt reports her R foot is improving. She has returned to working abour 6 hours a day.    Patient Stated Goals To have good stability of the R ankle. To feel confident with walking and jogging without the ankle brace. To be able to wear heals.    Currently in Pain? No/denies                [facility] PT Assessment - 11/24/20 0001       AROM   Right Ankle Dorsiflexion 15                           [facility] Adult PT Treatment/Exercise - 11/24/20 0001       Ambulation/Gait   Gait Pattern Step-through pattern   heel to toe pattern     Exercises   Exercises Ankle      Ankle Exercises: Standing   SLS 3x; 30 sec   decreased stability with corrective contractions R   Heel Raises Both;Right;Left   decreased stability with corrective contractions R   Toe Raise 20 reps;2 seconds      Ankle Exercises: Seated   Other Seated Ankle Exercises Ankle 4 way strengthening c green Tband; 10x2 each                    PT Education - 11/24/20  1722     Education Details Revised HEP    Person(s) Educated Patient    Methods Explanation;Demonstration;Tactile cues;Verbal cues;Handout    Comprehension Verbalized understanding;Returned demonstration;Verbal cues required;Tactile cues required;Need further instruction              PT Short Term Goals - 11/24/20 1727       PT SHORT TERM GOAL #1   Title Pt will be Ind in a HEP    Status Revised    Target Date 12/15/20               PT Long Term Goals - 11/24/20 1729       PT LONG TERM GOAL #1   Title Pt will be able to single leg stand for 30+ sec. with improved balance and stability of the R ankle in comparison to the L    Status Revised    Target Date 01/14/21      PT LONG TERM GOAL #2  Title Pt will be able to jog for 2 mins without an ankle brace for good tolerance and without issue of stability c the the R ankle to initiate a walking/jogging ex program for 2 mins walking/2 mins jogging for 10 mins.    Status Revised      PT LONG TERM GOAL #3   Title Pt will demonstrate 5/5 strength of the R ankle, knee and hip to optimize functional use of the R LE    Status Revised    Target Date 01/14/21      PT LONG TERM GOAL #4   Title Pt will be Ind in a final HEP to maintain or progress achieved LOF    Status Revised    Target Date 01/14/21                   Plan - 11/24/20 1724     Clinical Impression Statement Pt returns to PT after an extended time away due to a death in her family. Pt reports she has been completing some of her exs. Today in PT, pt demonstrates an appropriate gait pattern, improved balance, and R ankle strength. Higher level stability of the R ankle was found to be decreased in comparison to the L with SLS and heel raises. Pt's HEP was revised and pt returned demonstration of these exs. Pt will benefit from PT 1w4 to improve R ankle strength, balance and stability for pt to reach her goal of walking/jogging for exercise.    Personal  Factors and Comorbidities Time since onset of injury/illness/exacerbation;Comorbidity 1    Comorbidities BMI    Examination-Activity Limitations Locomotion Level    Examination-Participation Restrictions Other    Stability/Clinical Decision Making Stable/Uncomplicated    Clinical Decision Making Low    Rehab Potential Good    PT Frequency 1x / week    PT Duration 4 weeks    PT Treatment/Interventions ADLs/Self Care Home Management;Cryotherapy;Electrical Stimulation;Iontophoresis 37m/ml Dexamethasone;Moist Heat;Balance training;Therapeutic exercise;Therapeutic activities;Functional mobility training;Stair training;Gait training;Patient/family education;Manual techniques;Passive range of motion;Taping    PT Next Visit Plan Assess response to HEP, progress ther ex for strengthening and balance as indicated, assess r knee and hip strength, assess walking with incrased pace to jogging as indicated..    PT Home Exercise Plan [id number]   Consulted and Agree with Plan of Care Patient             Patient will benefit from skilled therapeutic intervention in order to improve the following deficits and impairments:  Difficulty walking,Decreased strength,Impaired sensation,Decreased balance  Visit Diagnosis: Hammer toe of right foot  Sprain of anterior talofibular ligament of right ankle, sequela  S/P ankle ligament repair  Muscle weakness (generalized)  Ankle instability, right     Problem List Patient Active Problem List   Diagnosis Date Noted   Asthma in adult, moderate persistent, with acute exacerbation 09/30/2020   Rash and nonspecific skin eruption 08/05/2019   Encounter for screening for cervical cancer 06/09/2019   Candidal intertrigo 06/09/2019   Moderate persistent asthma without complication 063/[id number]/5643  Cigarette smoker 02/13/2018   Shortness of breath 02/06/2018   Chronic urticaria 02/06/2018   Tachycardia 01/04/2013   AGar PontoMS, PT 11/24/20 6:09 PM  [facility] [address]Phone: [phone]  Fax:  [phone] Name: RDAPHNA LAFUENTEMRN: [id number] of Birth: [birth year] PHYSICAL THERAPY DISCHARGE SUMMARY  Visits from Start of Care: 2  Current functional  level related to goals / functional outcomes: unknown   Remaining deficits: unknown   Education / Equipment: HEP  Patient agrees to discharge. Patient goals were partially met. Patient is being discharged due to not returning since the last visit.  Truong Delcastillo MS, PT 04/18/21 8:43 AM

## 2020-12-10 ENCOUNTER — Ambulatory Visit: Payer: Managed Care, Other (non HMO) | Attending: Podiatry | Admitting: Physical Therapy

## 2020-12-17 ENCOUNTER — Other Ambulatory Visit: Payer: Self-pay | Admitting: Family Medicine

## 2020-12-17 DIAGNOSIS — R21 Rash and other nonspecific skin eruption: Secondary | ICD-10-CM

## 2020-12-20 ENCOUNTER — Encounter: Payer: Self-pay | Admitting: Podiatry

## 2020-12-20 ENCOUNTER — Ambulatory Visit (INDEPENDENT_AMBULATORY_CARE_PROVIDER_SITE_OTHER): Payer: Medicaid Other | Admitting: Podiatry

## 2020-12-20 ENCOUNTER — Other Ambulatory Visit: Payer: Self-pay

## 2020-12-20 DIAGNOSIS — M24271 Disorder of ligament, right ankle: Secondary | ICD-10-CM | POA: Diagnosis not present

## 2020-12-20 DIAGNOSIS — B353 Tinea pedis: Secondary | ICD-10-CM

## 2020-12-20 DIAGNOSIS — M2041 Other hammer toe(s) (acquired), right foot: Secondary | ICD-10-CM | POA: Diagnosis not present

## 2020-12-21 ENCOUNTER — Encounter: Payer: Self-pay | Admitting: Podiatry

## 2020-12-21 ENCOUNTER — Telehealth: Payer: Self-pay | Admitting: Podiatry

## 2020-12-21 NOTE — Telephone Encounter (Signed)
done

## 2020-12-21 NOTE — Telephone Encounter (Signed)
Patient calling to say that her work not written yesterday is incorrect. She states its supposed to say she can return to work today 12/21/20 with the restrictions lasting 6 weeks until 02/01/21. Please fax new note to patients employer  [phone]

## 2021-01-11 NOTE — Progress Notes (Signed)
  Subjective:  Patient ID: Taylor Wilkinson, female    DOB: 05-13-1976,  MRN: [id number]  Chief Complaint  Patient presents with  . Follow-up    Follow up on ankle. Pt reports desire to continue working same hours if possible-still continues to have numbness and swelling in toes.    DOS: 06/09/20 Procedure: Right ankle stabilization, hammertoe repairs 3,4,5   45 y.o. female presents with the above complaint. History confirmed with patient.   Objective:  Physical Exam: No tenderness lateral ankle, toes rectus with sant edema, no pain to palpation.no active edema Incision: Incisions well-healed.  Assessment:   1. Hammer toe of right foot   2. Tinea pedis of right foot   3. Ligamentous laxity of right ankle    Plan:  Patient was evaluated and treated and all questions answered.  Post-operative State -She is doing quite well.  She still has some residual swelling and occasional numbness but thinks that the work restrictions have not helping her.  Feels she is ready for advancing work restrictions.  We will advance her restrictions but still limit her to 6-hour shifts for the next 6 weeks.  We will request frequent breaks and no continuous standing for greater than 3 hours.   No follow-ups on file.

## 2021-01-30 ENCOUNTER — Encounter: Payer: Self-pay | Admitting: Podiatry

## 2021-02-17 ENCOUNTER — Other Ambulatory Visit: Payer: Self-pay

## 2021-02-17 ENCOUNTER — Ambulatory Visit (INDEPENDENT_AMBULATORY_CARE_PROVIDER_SITE_OTHER): Payer: Managed Care, Other (non HMO) | Admitting: Family Medicine

## 2021-02-17 ENCOUNTER — Encounter: Payer: Self-pay | Admitting: Family Medicine

## 2021-02-17 VITALS — BP 110/74 | HR 80 | Ht 66.0 in | Wt 182.0 lb

## 2021-02-17 DIAGNOSIS — M25469 Effusion, unspecified knee: Secondary | ICD-10-CM

## 2021-02-17 NOTE — Patient Instructions (Signed)
It was nice to see you today,  For your knee swelling, I do not believe it is infectious or related to gout or other inflammatory conditions.  There may have been some distant underlying trauma that was reaggravated but I cannot be sure.  Therefore I would like you to go to the sports medicine doctors to get further imaging with ultrasound and possibly aspiration if they feel it is necessary.  In the meantime, I would like you to wear a knee sleeve for that you can get over-the-counter at any pharmacy.  This will help decrease the swelling.  Wear while you are ambulating.  If you start to develop any fevers, nausea or vomiting please seek medical attention.  Have a great day,  Clemetine Marker, MD

## 2021-02-17 NOTE — Progress Notes (Signed)
    SUBJECTIVE:   CHIEF COMPLAINT / HPI:   Knee swelling: swelling in left leg for two weeks.  Woke up one day like this.  Does not recall injuring prior.  Not really having pain other than in her lateral thigh proximal to the knee.  Nothing seems to make it better or worse.  No fever/chills or n/v/d.  No hx of gout.  Taking motrin for it.  Not putting ice on it.  No hx of arthritis.  Sexually active with same partner for past 12 years.  No IV drug use.   PERTINENT  PMH / PSH:   OBJECTIVE:   BP 110/74   Pulse 80   Ht 5\' 6"  (1.676 m)   Wt 182 lb (82.6 kg)   LMP 02/01/2021   SpO2 99%   BMI 29.38 kg/m   Gen: alert, oriented. No acute distress.  Msk: peripetallar swelling in the left leg. No tenderness.  Negative patellar grind.  Negative thessaly.  Normal range of motion.  ASSESSMENT/PLAN:   Knee swelling Uncertain cause.  Without knee pain or hx of injury/overuse then bursitis, meniscus or ligamentous injury less likely. No erythema/warmth/tenderness to suggest gout or infectious arthritis.  Advised to use knee sleeve, ice to reduce swelling, elevation.  Will refer to sports medicine for further evaluation.       Benay Pike, MD [place]

## 2021-02-19 DIAGNOSIS — M659 Unspecified synovitis and tenosynovitis, unspecified site: Secondary | ICD-10-CM | POA: Insufficient documentation

## 2021-02-19 DIAGNOSIS — M25469 Effusion, unspecified knee: Secondary | ICD-10-CM | POA: Insufficient documentation

## 2021-02-19 NOTE — Assessment & Plan Note (Signed)
>>  ASSESSMENT AND PLAN FOR KNEE SWELLING WRITTEN ON 02/19/2021  5:24 PM BY Benay Pike, MD  Uncertain cause.  Without knee pain or hx of injury/overuse then bursitis, meniscus or ligamentous injury less likely. No erythema/warmth/tenderness to suggest gout or infectious arthritis.  Advised to use knee sleeve, ice to reduce swelling, elevation.  Will refer to sports medicine for further evaluation.

## 2021-02-19 NOTE — Assessment & Plan Note (Signed)
Uncertain cause.  Without knee pain or hx of injury/overuse then bursitis, meniscus or ligamentous injury less likely. No erythema/warmth/tenderness to suggest gout or infectious arthritis.  Advised to use knee sleeve, ice to reduce swelling, elevation.  Will refer to sports medicine for further evaluation.

## 2021-02-22 ENCOUNTER — Encounter: Payer: Self-pay | Admitting: Family Medicine

## 2021-02-22 ENCOUNTER — Other Ambulatory Visit: Payer: Self-pay

## 2021-02-22 ENCOUNTER — Ambulatory Visit (INDEPENDENT_AMBULATORY_CARE_PROVIDER_SITE_OTHER): Payer: Managed Care, Other (non HMO) | Admitting: Family Medicine

## 2021-02-22 VITALS — BP 137/89 | Ht 66.0 in | Wt 175.0 lb

## 2021-02-22 DIAGNOSIS — M659 Synovitis and tenosynovitis, unspecified: Secondary | ICD-10-CM | POA: Diagnosis not present

## 2021-02-22 DIAGNOSIS — M79662 Pain in left lower leg: Secondary | ICD-10-CM

## 2021-02-22 DIAGNOSIS — I872 Venous insufficiency (chronic) (peripheral): Secondary | ICD-10-CM | POA: Insufficient documentation

## 2021-02-22 MED ORDER — DICLOFENAC SODIUM 75 MG PO TBEC: 75.0000 mg | DELAYED_RELEASE_TABLET | Freq: Two times a day (BID) | ORAL | 1 refills | Status: DC

## 2021-02-22 NOTE — Progress Notes (Addendum)
    SUBJECTIVE:   CHIEF COMPLAINT / HPI:   Taylor Wilkinson is a 45 yr old female who presents with left knee pain  Left knee pain  Started a few weeks ago after having sexual intercourse with her husband. She noticed the pain the day after and swelling around the left knee a few days after. Saw Pcp who recommended brace. The brace has been helping a little. Has not taken analgesia.  Pain primarily medial.  No catching, locking, giving out.  Leg vein concerns  Pt has noticed swelling of superficial veins over left lower leg over the last few days. Denies calf pain, hx of DVT, immobilization, oral contraceptives, hx of cancer etc. Denies chest pain or dyspnea.  PERTINENT  PMH / PSH: Asthma   OBJECTIVE:   BP 137/89   Ht 5\' 6"  (1.676 m)   Wt 175 lb (79.4 kg)   LMP 02/01/2021   BMI 28.25 kg/m    Knee: - Inspection: no gross deformity. Mild effusion right knee. No erythema or bruising. Skin intact. Dilated and tortuous superficial veins over left lower extremity - Palpation: TTP Medial joint line. - ROM: full active ROM with flexion and extension in knee  - Strength: 5/5 strength - Neuro/vasc: NV intact - Special Tests: - LIGAMENTS: negative anterior and posterior drawer, negative Lachman's, no MCL or LCL laxity  -- MENISCUS: negative McMurray's, negative Thessaly     ASSESSMENT/PLAN:   Synovitis Symptoms consistent with synovitis. Could also be OA. Unlikely to be ligament or meniscal injury. Recommended knee brace, ice, oral diclofenac etc. Follow up in 2 weeks if persistent sx, can consider joint injection.  Venous (peripheral) insufficiency Most likely superficial venous insufficiency. No calf tenderness or RFs for thromboembolism however will refer for DVT US.      Taylor Haw, MD [place]

## 2021-02-22 NOTE — Assessment & Plan Note (Signed)
Most likely superficial venous insufficiency. No calf tenderness or RFs for thromboembolism however will refer for DVT US.

## 2021-02-22 NOTE — Patient Instructions (Signed)
Your exam is consistent with a flare of mild arthritis with synovitis vs contusion to the meniscus. Both are treated conservatively. Ice 15 minutes at a time 3-4 times a day. Wear the compression sleeve during the day. Diclofenac 75mg  twice a day with food for pain and inflammation. We will contact you with the results of the doppler ultrasound. Let me know in 1-2 weeks if you're not improving and we can consider injection of the knee.

## 2021-02-22 NOTE — Assessment & Plan Note (Signed)
Symptoms consistent with synovitis. Could also be OA. Unlikely to be ligament or meniscal injury. Recommended knee brace, ice, oral diclofenac etc. Follow up in 2 weeks if persistent sx, can consider joint injection.

## 2021-02-23 ENCOUNTER — Ambulatory Visit (HOSPITAL_COMMUNITY)
Admission: RE | Admit: 2021-02-23 | Discharge: 2021-02-23 | Disposition: A | Discharge status: Home / Self Care | Payer: Managed Care, Other (non HMO) | Source: Ambulatory Visit | Attending: Family Medicine | Admitting: Family Medicine

## 2021-02-23 DIAGNOSIS — M79662 Pain in left lower leg: Secondary | ICD-10-CM | POA: Diagnosis not present

## 2021-02-23 NOTE — Progress Notes (Signed)
Lower extremity venous has been completed.   Preliminary results in CV Proc.   Abram Sander 02/23/2021 3:55 PM

## 2021-03-01 ENCOUNTER — Encounter (HOSPITAL_COMMUNITY): Payer: Self-pay | Admitting: Emergency Medicine

## 2021-03-01 ENCOUNTER — Other Ambulatory Visit: Payer: Self-pay

## 2021-03-01 ENCOUNTER — Emergency Department (HOSPITAL_COMMUNITY)
Admission: EM | Admit: 2021-03-01 | Discharge: 2021-03-02 | Disposition: A | Discharge status: Home / Self Care | Payer: Managed Care, Other (non HMO) | Attending: Emergency Medicine | Admitting: Emergency Medicine

## 2021-03-01 ENCOUNTER — Ambulatory Visit (HOSPITAL_COMMUNITY)
Admission: EM | Admit: 2021-03-01 | Discharge: 2021-03-01 | Disposition: A | Discharge status: Home / Self Care | Payer: Managed Care, Other (non HMO) | Attending: Medical Oncology | Admitting: Medical Oncology

## 2021-03-01 ENCOUNTER — Emergency Department (HOSPITAL_COMMUNITY): Payer: Managed Care, Other (non HMO)

## 2021-03-01 DIAGNOSIS — Z87891 Personal history of nicotine dependence: Secondary | ICD-10-CM | POA: Diagnosis not present

## 2021-03-01 DIAGNOSIS — Z7952 Long term (current) use of systemic steroids: Secondary | ICD-10-CM | POA: Insufficient documentation

## 2021-03-01 DIAGNOSIS — J45909 Unspecified asthma, uncomplicated: Secondary | ICD-10-CM | POA: Insufficient documentation

## 2021-03-01 DIAGNOSIS — D252 Subserosal leiomyoma of uterus: Secondary | ICD-10-CM | POA: Diagnosis not present

## 2021-03-01 DIAGNOSIS — R109 Unspecified abdominal pain: Secondary | ICD-10-CM | POA: Diagnosis not present

## 2021-03-01 DIAGNOSIS — Z113 Encounter for screening for infections with a predominantly sexual mode of transmission: Secondary | ICD-10-CM | POA: Insufficient documentation

## 2021-03-01 DIAGNOSIS — D259 Leiomyoma of uterus, unspecified: Secondary | ICD-10-CM | POA: Insufficient documentation

## 2021-03-01 DIAGNOSIS — D251 Intramural leiomyoma of uterus: Secondary | ICD-10-CM | POA: Diagnosis not present

## 2021-03-01 DIAGNOSIS — R1032 Left lower quadrant pain: Secondary | ICD-10-CM | POA: Diagnosis not present

## 2021-03-01 DIAGNOSIS — D219 Benign neoplasm of connective and other soft tissue, unspecified: Secondary | ICD-10-CM

## 2021-03-01 DIAGNOSIS — R102 Pelvic and perineal pain: Secondary | ICD-10-CM | POA: Diagnosis not present

## 2021-03-01 LAB — CBC WITH DIFFERENTIAL/PLATELET
Abs Immature Granulocytes: 0.02 10*3/uL (ref 0.00–0.07)
Basophils Absolute: 0.1 10*3/uL (ref 0.0–0.1)
Basophils Relative: 1 %
Eosinophils Absolute: 0.2 10*3/uL (ref 0.0–0.5)
Eosinophils Relative: 3 %
HCT: 40.1 % (ref 36.0–46.0)
Hemoglobin: 13.8 g/dL (ref 12.0–15.0)
Immature Granulocytes: 0 %
Lymphocytes Relative: 35 %
Lymphs Abs: 2.6 10*3/uL (ref 0.7–4.0)
MCH: 33.3 pg (ref 26.0–34.0)
MCHC: 34.4 g/dL (ref 30.0–36.0)
MCV: 96.6 fL (ref 80.0–100.0)
Monocytes Absolute: 0.5 10*3/uL (ref 0.1–1.0)
Monocytes Relative: 7 %
Neutro Abs: 4 10*3/uL (ref 1.7–7.7)
Neutrophils Relative %: 54 %
Platelets: 230 10*3/uL (ref 150–400)
RBC: 4.15 MIL/uL (ref 3.87–5.11)
RDW: 14.7 % (ref 11.5–15.5)
WBC: 7.4 10*3/uL (ref 4.0–10.5)
nRBC: 0 % (ref 0.0–0.2)

## 2021-03-01 LAB — COMPREHENSIVE METABOLIC PANEL
ALT: 16 U/L (ref 0–44)
AST: 15 U/L (ref 15–41)
Albumin: 3.9 g/dL (ref 3.5–5.0)
Alkaline Phosphatase: 63 U/L (ref 38–126)
Anion gap: 8 (ref 5–15)
BUN: 12 mg/dL (ref 6–20)
CO2: 24 mmol/L (ref 22–32)
Calcium: 8.9 mg/dL (ref 8.9–10.3)
Chloride: 106 mmol/L (ref 98–111)
Creatinine, Ser: 0.7 mg/dL (ref 0.44–1.00)
GFR, Estimated: >60 mL/min (ref >60)
Glucose, Bld: 91 mg/dL (ref 70–99)
Potassium: 3.2 mmol/L — ABNORMAL LOW (ref 3.5–5.1)
Sodium: 138 mmol/L (ref 135–145)
Total Bilirubin: 0.3 mg/dL (ref 0.3–1.2)
Total Protein: 7.1 g/dL (ref 6.5–8.1)

## 2021-03-01 LAB — URINALYSIS, ROUTINE W REFLEX MICROSCOPIC
Bilirubin Urine: NEGATIVE
Glucose, UA: NEGATIVE mg/dL
Hgb urine dipstick: NEGATIVE
Ketones, ur: NEGATIVE mg/dL
Leukocytes,Ua: NEGATIVE
Nitrite: NEGATIVE
Protein, ur: NEGATIVE mg/dL
Specific Gravity, Urine: 1.015 (ref 1.005–1.030)
pH: 6 (ref 5.0–8.0)

## 2021-03-01 LAB — WET PREP, GENITAL
Clue Cells Wet Prep HPF POC: NONE SEEN
Sperm: NONE SEEN
Trich, Wet Prep: NONE SEEN
WBC, Wet Prep HPF POC: NONE SEEN
Yeast Wet Prep HPF POC: NONE SEEN

## 2021-03-01 LAB — POCT URINALYSIS DIPSTICK, ED / UC
Bilirubin Urine: NEGATIVE
Glucose, UA: NEGATIVE mg/dL
Hgb urine dipstick: NEGATIVE
Ketones, ur: NEGATIVE mg/dL
Leukocytes,Ua: NEGATIVE
Nitrite: NEGATIVE
Protein, ur: NEGATIVE mg/dL
Specific Gravity, Urine: 1.015 (ref 1.005–1.030)
Urobilinogen, UA: 1 mg/dL (ref 0.0–1.0)
pH: 6.5 (ref 5.0–8.0)

## 2021-03-01 LAB — I-STAT BETA HCG BLOOD, ED (MC, WL, AP ONLY): I-stat hCG, quantitative: <5 m[IU]/mL (ref <5)

## 2021-03-01 LAB — POC URINE PREG, ED: Preg Test, Ur: NEGATIVE

## 2021-03-01 MED ORDER — MORPHINE SULFATE (PF) 4 MG/ML IV SOLN
4.0000 mg | Freq: Once | INTRAVENOUS | Status: AC
  Administered 2021-03-01: 4 mg via INTRAVENOUS
  Filled 2021-03-01: qty 1

## 2021-03-01 MED ORDER — SODIUM CHLORIDE 0.9 % IV BOLUS
500.0000 mL | Freq: Once | INTRAVENOUS | Status: AC
  Administered 2021-03-01: 500 mL via INTRAVENOUS

## 2021-03-01 MED ORDER — ONDANSETRON HCL 4 MG/2ML IJ SOLN
4.0000 mg | Freq: Once | INTRAMUSCULAR | Status: AC
  Administered 2021-03-01: 4 mg via INTRAVENOUS
  Filled 2021-03-01: qty 2

## 2021-03-01 MED ORDER — IOHEXOL 300 MG/ML  SOLN
100.0000 mL | Freq: Once | INTRAMUSCULAR | Status: AC | PRN
  Administered 2021-03-01: 100 mL via INTRAVENOUS

## 2021-03-01 MED ORDER — SODIUM CHLORIDE (PF) 0.9 % IJ SOLN
INTRAMUSCULAR | Status: AC
  Filled 2021-03-01: qty 50

## 2021-03-01 NOTE — ED Provider Notes (Signed)
Taylor Wilkinson Provider Note   CSN: [id number] Arrival date & time: 03/01/21  1925     History Chief Complaint  Patient presents with   Abdominal Pain    Taylor Wilkinson is a 45 y.o. female with a hx of asthma, who presents to the ED from UC with complaints of abdominal pain that began yesterday. Patient reports steady sharp pain to the left pelvic region, no alleviating/aggravating factors. Has had some associated nausea. Seen @ UC, concern for hernia & was referred to the ED. Denies fever, chills, vomiting, diarrhea, melena, hematochezia, or vaginal bleeding/discharge. Monogamous.   HPI     Past Medical History:  Diagnosis Date   Asthma    Bronchitis    Miscarriage     Patient Active Problem List   Diagnosis Date Noted   Synovitis 02/22/2021   Venous (peripheral) insufficiency 02/22/2021   Knee swelling 02/19/2021   Asthma in adult, moderate persistent, with acute exacerbation 09/30/2020   Rash and nonspecific skin eruption 08/05/2019   Encounter for screening for cervical cancer 06/09/2019   Candidal intertrigo 06/09/2019   Moderate persistent asthma without complication 72/53/6644   Cigarette smoker 02/13/2018   Shortness of breath 02/06/2018   Chronic urticaria 02/06/2018   Tachycardia 01/04/2013    Past Surgical History:  Procedure Laterality Date   NO PAST SURGERIES       OB History     Gravida  6   Para  4   Term  4   Preterm      AB  1   Living  4      SAB  1   IAB      Ectopic      Multiple      Live Births              Family History  Problem Relation Age of Onset   Hypertension Mother    Diabetes Mother    Asthma Brother    Healthy Father     Social History   Tobacco Use   Smoking status: Former    Packs/day: 0.50    Years: 16.00    Pack years: 8.00    Types: Cigars, Cigarettes   Smokeless tobacco: Never   Tobacco comments:    3 cigars per day  Vaping Use   Vaping Use:  Never used  Substance Use Topics   Alcohol use: Yes    Comment: social   Drug use: Yes    Types: Marijuana    Comment: marijuana--blunt a day--maybe 2 a day    Home Medications Prior to Admission medications   Medication Sig Start Date End Date Taking? Authorizing Provider  albuterol (PROAIR HFA) 108 (90 Base) MCG/ACT inhaler 2 puffs every 4 hours as needed only  if your can't catch your breath 03/03/20   Tanda Rockers, MD  budesonide-formoterol (SYMBICORT) 160-4.5 MCG/ACT inhaler Inhale 2 puffs into the lungs 2 (two) times daily. 06/01/20   Tanda Rockers, MD  cephALEXin (KEFLEX) 500 MG capsule Take 1 capsule (500 mg total) by mouth 2 (two) times daily. Patient not taking: Reported on 03/01/2021 06/09/20   Evelina Bucy, DPM  diclofenac (VOLTAREN) 75 MG EC tablet Take 1 tablet (75 mg total) by mouth 2 (two) times daily. Patient not taking: Reported on 03/01/2021 02/22/21   Dene Gentry, MD  fluticasone furoate-vilanterol (BREO ELLIPTA) 100-25 MCG/INH AEPB Inhale 1 puff into the lungs daily. 09/13/20   Tanda Rockers, MD  fluticasone furoate-vilanterol (BREO ELLIPTA) 100-25 MCG/INH AEPB TAKE 1 PUFF BY MOUTH EVERY DAY 10/21/20   Zenia Resides, MD  ipratropium-albuterol (DUONEB) 0.5-2.5 (3) MG/3ML SOLN TAKE 3 MLS BY NEBULIZATION EVERY 4 (FOUR) HOURS AS NEEDED. 11/18/20   Meccariello, Bernita Raisin, DO  ketoconazole (NIZORAL) 2 % cream APPLY 1 FINGERTIP AMOUNT TO EACH FOOT DAILY. 10/31/20   Evelina Bucy, DPM  ondansetron (ZOFRAN) 4 MG tablet Take 1 tablet (4 mg total) by mouth every 8 (eight) hours as needed for nausea or vomiting. 06/09/20   Evelina Bucy, DPM  oxyCODONE-acetaminophen (PERCOCET) 5-325 MG tablet Take 1 tablet by mouth every 4 (four) hours as needed for severe pain. Patient not taking: Reported on 03/01/2021 08/12/20   Evelina Bucy, DPM  terconazole (TERAZOL 3) 0.8 % vaginal cream PLACE 1 APPLICATOR VAGINALLY AT BEDTIME. 05/23/20   [name], MD  triamcinolone  ointment (KENALOG) 0.1 % APPLY TO AFFECTED AREA TWICE A DAY 12/19/20   Patriciaann Clan, DO  loratadine (CLARITIN) 10 MG tablet Take 1 tablet (10 mg total) by mouth daily. 06/28/19 07/23/19  [name], NP  montelukast (SINGULAIR) 10 MG tablet Take 1 tablet (10 mg total) by mouth at bedtime. Patient not taking: Reported on 08/31/2019 07/08/19 10/11/19  [name], PA-C    Allergies    Patient has no known allergies.  Review of Systems   Review of Systems  Constitutional:  Negative for chills and fever.  Respiratory:  Negative for shortness of breath.   Cardiovascular:  Negative for chest pain.  Gastrointestinal:  Positive for abdominal pain and nausea. Negative for blood in stool, constipation, diarrhea and vomiting.  Genitourinary:  Negative for dysuria, vaginal bleeding and vaginal discharge.  Neurological:  Negative for syncope.  All other systems reviewed and are negative.  Physical Exam Updated Vital Signs BP (!) 135/92 (BP Location: Right Arm)   Pulse 65   Temp 98.4 F (36.9 C) (Oral)   Resp 18   LMP 02/20/2021   SpO2 100%   Physical Exam Vitals and nursing note reviewed.  Constitutional:      General: She is not in acute distress.    Appearance: She is well-developed. She is not toxic-appearing.  HENT:     Head: Normocephalic and atraumatic.  Eyes:     General:        Right eye: No discharge.        Left eye: No discharge.     Conjunctiva/sclera: Conjunctivae normal.  Cardiovascular:     Rate and Rhythm: Normal rate and regular rhythm.  Pulmonary:     Effort: Pulmonary effort is normal. No respiratory distress.     Breath sounds: Normal breath sounds. No wheezing, rhonchi or rales.  Abdominal:     General: There is no distension.     Palpations: Abdomen is soft.     Tenderness: There is abdominal tenderness (L suprapubic). There is no guarding or rebound.  Genitourinary:    Comments: NT present as chaperone.  No external lesions.  Minimal discharge  present.  No CMT. L adnexal tenderness.  Musculoskeletal:     Cervical back: Neck supple.  Skin:    General: Skin is warm and dry.     Findings: No rash.  Neurological:     Mental Status: She is alert.     Comments: Clear speech.   Psychiatric:        Behavior: Behavior normal.    ED Results / Procedures / Treatments  Labs (all labs ordered are listed, but only abnormal results are displayed) Labs Reviewed  COMPREHENSIVE METABOLIC PANEL - Abnormal; Notable for the following components:      Result Value   Potassium 3.2 (*)    All other components within normal limits  CBC WITH DIFFERENTIAL/PLATELET  URINALYSIS, ROUTINE W REFLEX MICROSCOPIC  I-STAT BETA HCG BLOOD, ED ([facility], [facility], [facility] ONLY)    EKG None  Radiology US Transvaginal Non-OB  Result Date: 03/02/2021 CLINICAL DATA:  Initial evaluation for acute left-sided pelvic pain. EXAM: TRANSABDOMINAL AND TRANSVAGINAL ULTRASOUND OF PELVIS DOPPLER ULTRASOUND OF OVARIES TECHNIQUE: Both transabdominal and transvaginal ultrasound examinations of the pelvis were performed. Transabdominal technique was performed for global imaging of the pelvis including uterus, ovaries, adnexal regions, and pelvic cul-de-sac. It was necessary to proceed with endovaginal exam following the transabdominal exam to visualize the uterus, endometrium, and ovaries. Color and duplex Doppler ultrasound was utilized to evaluate blood flow to the ovaries. COMPARISON:  Prior CT from 03/01/2021 FINDINGS: Uterus Measurements: 11.3 x 6.2 x 6.4 cm = volume: 233.0 mL. Uterus is anteverted. Few scattered small uterine fibroids are seen. 1. 1.7 x 1.7 x 1.7 cm intramural fibroid at the right uterine fundus. 2. 1.8 x 2.0 x 1.3 cm subserosal fibroid at the right posterior uterine fundus/body. 3. 1.6 x 1.3 x 1.2 cm subserosal fibroid at the posterior uterine body. Endometrium Thickness: 9 mm.  No focal abnormality visualized. Right ovary Measurements: 3.3 x 2.0 x 2.7 cm = volume: 9.2  mL. Normal appearance/no adnexal mass. Left ovary Measurements: 2.7 x 2.0 x 3.3 cm = volume: 9.4 mL. Normal appearance/no adnexal mass. Pulsed Doppler evaluation of both ovaries demonstrates normal low-resistance arterial and venous waveforms. Other findings Trace free fluid within the pelvis, presumably physiologic. IMPRESSION: 1. Few small uterine fibroids measuring up to 2 cm as above. 2. Otherwise unremarkable and normal pelvic ultrasound. No evidence for torsion or other acute abnormality. Electronically Signed   By: Jeannine Boga M.D.   On: 03/02/2021 02:14   US Pelvis Complete  Result Date: 03/02/2021 CLINICAL DATA:  Initial evaluation for acute left-sided pelvic pain. EXAM: TRANSABDOMINAL AND TRANSVAGINAL ULTRASOUND OF PELVIS DOPPLER ULTRASOUND OF OVARIES TECHNIQUE: Both transabdominal and transvaginal ultrasound examinations of the pelvis were performed. Transabdominal technique was performed for global imaging of the pelvis including uterus, ovaries, adnexal regions, and pelvic cul-de-sac. It was necessary to proceed with endovaginal exam following the transabdominal exam to visualize the uterus, endometrium, and ovaries. Color and duplex Doppler ultrasound was utilized to evaluate blood flow to the ovaries. COMPARISON:  Prior CT from 03/01/2021. FINDINGS: Uterus Measurements: 11.3 x 6.2 x 6.4 cm = volume: 233.0 mL. Uterus is anteverted. Few scattered small uterine fibroids are seen. 1. 1.7 x 1.7 x 1.7 cm intramural fibroid at the right uterine fundus. 2. 1.8 x 2.0 x 1.3 cm subserosal fibroid at the right posterior uterine fundus/body. 3. 1.6 x 1.3 x 1.2 cm subserosal fibroid at the posterior uterine body. Endometrium Thickness: 9 mm.  No focal abnormality visualized. Right ovary Measurements: 3.3 x 2.0 x 2.7 cm = volume: 9.2 mL. Normal appearance/no adnexal mass. Left ovary Measurements: 2.7 x 2.0 x 3.3 cm = volume: 9.4 mL. Normal appearance/no adnexal mass. Pulsed Doppler evaluation of both  ovaries demonstrates normal low-resistance arterial and venous waveforms. Other findings Trace free fluid within the pelvis, presumably physiologic. IMPRESSION: 1. Few small uterine fibroids measuring up to 2 cm as above. 2. Otherwise unremarkable and normal pelvic ultrasound. No evidence for  torsion or other acute abnormality. Electronically Signed   By: Jeannine Boga M.D.   On: 03/02/2021 02:06   CT Abdomen Pelvis W Contrast  Result Date: 03/01/2021 CLINICAL DATA:  Acute nonlocalized abdominal pain, left inguinal pain EXAM: CT ABDOMEN AND PELVIS WITH CONTRAST TECHNIQUE: Multidetector CT imaging of the abdomen and pelvis was performed using the standard protocol following bolus administration of intravenous contrast. CONTRAST:  142mL OMNIPAQUE IOHEXOL 300 MG/ML  SOLN COMPARISON:  None. FINDINGS: Lower chest: The visualized lung bases are clear bilaterally. The visualized heart and pericardium are unremarkable. Hepatobiliary: No focal liver abnormality is seen. No gallstones, gallbladder wall thickening, or biliary dilatation. Pancreas: Unremarkable Spleen: Unremarkable Adrenals/Urinary Tract: Adrenal glands are unremarkable. Kidneys are normal, without renal calculi, focal lesion, or hydronephrosis. Bladder is unremarkable. Stomach/Bowel: Stomach is within normal limits. Appendix appears normal. No evidence of bowel wall thickening, distention, or inflammatory changes. No free intraperitoneal gas or fluid. Vascular/Lymphatic: No significant vascular findings are present. No enlarged abdominal or pelvic lymph nodes. Reproductive: Multiple enhancing masses seen within the uterus likely represent multiple small uterine fibroids measuring up to 2 cm. The pelvic organs are otherwise unremarkable. Other: Tiny fat containing umbilical hernia. The rectum is unremarkable. The left inguinal canal is unremarkable. Musculoskeletal: No acute or significant osseous findings. IMPRESSION: No acute intra-abdominal  pathology identified. No definite radiographic explanation for the patient's reported symptoms. Multiple small probable uterine fibroids. Electronically Signed   By: Fidela Salisbury MD   On: 03/01/2021 22:39   [place] Art/Ven Flow Abd Pelv Doppler  Result Date: 03/02/2021 CLINICAL DATA:  Initial evaluation for acute left-sided pelvic pain. EXAM: TRANSABDOMINAL AND TRANSVAGINAL ULTRASOUND OF PELVIS DOPPLER ULTRASOUND OF OVARIES TECHNIQUE: Both transabdominal and transvaginal ultrasound examinations of the pelvis were performed. Transabdominal technique was performed for global imaging of the pelvis including uterus, ovaries, adnexal regions, and pelvic cul-de-sac. It was necessary to proceed with endovaginal exam following the transabdominal exam to visualize the uterus, endometrium, and ovaries. Color and duplex Doppler ultrasound was utilized to evaluate blood flow to the ovaries. COMPARISON:  Prior CT from 03/01/2021. FINDINGS: Uterus Measurements: 11.3 x 6.2 x 6.4 cm = volume: 233.0 mL. Uterus is anteverted. Few scattered small uterine fibroids are seen. 1. 1.7 x 1.7 x 1.7 cm intramural fibroid at the right uterine fundus. 2. 1.8 x 2.0 x 1.3 cm subserosal fibroid at the right posterior uterine fundus/body. 3. 1.6 x 1.3 x 1.2 cm subserosal fibroid at the posterior uterine body. Endometrium Thickness: 9 mm.  No focal abnormality visualized. Right ovary Measurements: 3.3 x 2.0 x 2.7 cm = volume: 9.2 mL. Normal appearance/no adnexal mass. Left ovary Measurements: 2.7 x 2.0 x 3.3 cm = volume: 9.4 mL. Normal appearance/no adnexal mass. Pulsed Doppler evaluation of both ovaries demonstrates normal low-resistance arterial and venous waveforms. Other findings Trace free fluid within the pelvis, presumably physiologic. IMPRESSION: 1. Few small uterine fibroids measuring up to 2 cm as above. 2. Otherwise unremarkable and normal pelvic ultrasound. No evidence for torsion or other acute abnormality. Electronically Signed   By:  Jeannine Boga M.D.   On: 03/02/2021 02:06    Procedures Procedures   Medications Ordered in ED Medications  sodium chloride (PF) 0.9 % injection (has no administration in time range)  iohexol (OMNIPAQUE) 300 MG/ML solution 100 mL (100 mLs Intravenous Contrast Given 03/01/21 2221)  morphine 4 MG/ML injection 4 mg (4 mg Intravenous Given 03/01/21 2249)  ondansetron (ZOFRAN) injection 4 mg (4 mg Intravenous Given 03/01/21 2249)  sodium chloride 0.9 % bolus 500 mL (0 mLs Intravenous Stopped 03/01/21 2331)    ED Course  I have reviewed the triage vital signs and the nursing notes.  Pertinent labs & imaging results that were available during my care of the patient were reviewed by me and considered in my medical decision making (see chart for details).    MDM Rules/Calculators/A&P                          Patient presents to the ED with complaints of abdominal pain.   Additional history obtained:  Additional history obtained from chart review & nursing note review.   Work up initiated in triage.   Lab Tests:  I reviewed and interpreted labs, which included:  CBC: Unremarkable.  CMP: Mild hypokalemia.  Lipase: WNL UA: Negative Preg test: Negative  Imaging Studies ordered:  CT A/P ordered by provider in triage, I independently reviewed, formal radiology impression shows:  No acute intra-abdominal pathology identified. No definite radiographic explanation for the patient's reported symptoms. Multiple small probable uterine fibroids  ED Course:   Patient nontoxic appearing, in no apparent distress, vitals without significant abnormality. On exam patient tender to left suprapubic region, no peritoneal signs. L adnexal tenderness. Will plan for [place] for further assessment.    [place]: 1. Few small uterine fibroids measuring up to 2 cm as above. 2. Otherwise unremarkable and normal pelvic ultrasound. No evidence for torsion or other acute abnormality.   Repeat abdominal exam remains  w/o peritoneal signs do not suspect acute surgical abdomen. Preg test negative- doubt ectopic. Monogamous, low suspicion for PID. Reassuring CT/US, fibroids present- possible cause of pain. Will tx with NSAIDs, zofran as needed, GYN/PCP follow up. Patient feeling improved on reassessment & feels ready to go home. I discussed results, treatment plan, need for follow-up, and return precautions with the patient. Provided opportunity for questions, patient confirmed understanding and is in agreement with plan.   Portions of this note were generated with [employer]. Dictation errors may occur despite best attempts at proofreading.  Final Clinical Impression(s) / ED Diagnoses Final diagnoses:  Abdominal pain, unspecified abdominal location  Fibroids    Rx / DC Orders ED Discharge Orders          Ordered    ondansetron (ZOFRAN ODT) 4 MG disintegrating tablet  Every 8 hours PRN        03/02/21 0234    naproxen (NAPROSYN) 500 MG tablet  2 times daily PRN        03/02/21 0234             Amaryllis Dyke, PA-C 03/02/21 0236    Molpus, Jenny Reichmann, MD 03/02/21 [phone]

## 2021-03-01 NOTE — ED Triage Notes (Signed)
Last night started having pain in crease between torso and left thigh.  No known injury.  No urinary symptoms

## 2021-03-01 NOTE — ED Provider Notes (Signed)
[place]    CSN: [id number] Arrival date & time: 03/01/21  1624      History   Chief Complaint Chief Complaint  Patient presents with   Pelvic Pain    HPI Taylor Wilkinson is a 45 y.o. female.   HPI  Pelvic Pain: Pt reports that yesterday she suddenly had left groin pain. She was sitting in a chair that was high up on the ground when this occurred.  She reports no known injury.  She may have been lifting something around the time that this occurred.  She states that the pain has been continuous about an 8-10 out of 10 in nature.  She states she has had to leave work because of the pain and has been pretty incapable of doing much today due to the pain.  She has tried over-the-counter medications without relief of pain.  No urinary symptoms. No N/V/D or fevers.  Past Medical History:  Diagnosis Date   Asthma    Bronchitis    Miscarriage     Patient Active Problem List   Diagnosis Date Noted   Synovitis 02/22/2021   Venous (peripheral) insufficiency 02/22/2021   Knee swelling 02/19/2021   Asthma in adult, moderate persistent, with acute exacerbation 09/30/2020   Rash and nonspecific skin eruption 08/05/2019   Encounter for screening for cervical cancer 06/09/2019   Candidal intertrigo 06/09/2019   Moderate persistent asthma without complication 89/21/1941   Cigarette smoker 02/13/2018   Shortness of breath 02/06/2018   Chronic urticaria 02/06/2018   Tachycardia 01/04/2013    Past Surgical History:  Procedure Laterality Date   NO PAST SURGERIES      OB History     Gravida  6   Para  4   Term  4   Preterm      AB  1   Living  4      SAB  1   IAB      Ectopic      Multiple      Live Births               Home Medications    Prior to Admission medications   Medication Sig Start Date End Date Taking? Authorizing Provider  budesonide-formoterol (SYMBICORT) 160-4.5 MCG/ACT inhaler Inhale 2 puffs into the lungs 2 (two) times  daily. 06/01/20  Yes Tanda Rockers, MD  albuterol [facility] HFA) 108 (90 Base) MCG/ACT inhaler 2 puffs every 4 hours as needed only  if your can't catch your breath 03/03/20   Tanda Rockers, MD  cephALEXin (KEFLEX) 500 MG capsule Take 1 capsule (500 mg total) by mouth 2 (two) times daily. Patient not taking: Reported on 03/01/2021 06/09/20   Evelina Bucy, DPM  diclofenac (VOLTAREN) 75 MG EC tablet Take 1 tablet (75 mg total) by mouth 2 (two) times daily. Patient not taking: Reported on 03/01/2021 02/22/21   Dene Gentry, MD  fluticasone furoate-vilanterol (BREO ELLIPTA) 100-25 MCG/INH AEPB Inhale 1 puff into the lungs daily. 09/13/20   Tanda Rockers, MD  fluticasone furoate-vilanterol (BREO ELLIPTA) 100-25 MCG/INH AEPB TAKE 1 PUFF BY MOUTH EVERY DAY 10/21/20   Zenia Resides, MD  ipratropium-albuterol (DUONEB) 0.5-2.5 (3) MG/3ML SOLN TAKE 3 MLS BY NEBULIZATION EVERY 4 (FOUR) HOURS AS NEEDED. 11/18/20   Meccariello, Bernita Raisin, DO  ketoconazole (NIZORAL) 2 % cream APPLY 1 FINGERTIP AMOUNT TO EACH FOOT DAILY. 10/31/20   Evelina Bucy, DPM  ondansetron (ZOFRAN) 4 MG tablet Take  1 tablet (4 mg total) by mouth every 8 (eight) hours as needed for nausea or vomiting. 06/09/20   Evelina Bucy, DPM  oxyCODONE-acetaminophen (PERCOCET) 5-325 MG tablet Take 1 tablet by mouth every 4 (four) hours as needed for severe pain. Patient not taking: Reported on 03/01/2021 08/12/20   Evelina Bucy, DPM  terconazole (TERAZOL 3) 0.8 % vaginal cream PLACE 1 APPLICATOR VAGINALLY AT BEDTIME. 05/23/20   [name], MD  triamcinolone ointment (KENALOG) 0.1 % APPLY TO AFFECTED AREA TWICE A DAY 12/19/20   Patriciaann Clan, DO  loratadine (CLARITIN) 10 MG tablet Take 1 tablet (10 mg total) by mouth daily. 06/28/19 07/23/19  [name], NP  montelukast (SINGULAIR) 10 MG tablet Take 1 tablet (10 mg total) by mouth at bedtime. Patient not taking: Reported on 08/31/2019 07/08/19 10/11/19  [name], PA-C     Family History Family History  Problem Relation Age of Onset   Hypertension Mother    Diabetes Mother    Asthma Brother    Healthy Father     Social History Social History   Tobacco Use   Smoking status: Former    Packs/day: 0.50    Years: 16.00    Pack years: 8.00    Types: Cigars, Cigarettes   Smokeless tobacco: Never   Tobacco comments:    3 cigars per day  Vaping Use   Vaping Use: Never used  Substance Use Topics   Alcohol use: Yes    Comment: social   Drug use: Yes    Types: Marijuana    Comment: marijuana--blunt a day--maybe 2 a day     Allergies   Patient has no known allergies.   Review of Systems Review of Systems  As stated above in HPI Physical Exam Triage Vital Signs ED Triage Vitals  Enc Vitals Group     BP 03/01/21 1742 110/76     Pulse Rate 03/01/21 1742 86     Resp 03/01/21 1742 20     Temp 03/01/21 1742 98.9 F (37.2 C)     Temp Source 03/01/21 1742 Oral     SpO2 03/01/21 1742 99 %     Weight --      Height --      Head Circumference --      Peak Flow --      Pain Score 03/01/21 1738 10     Pain Loc --      Pain Edu? --      Excl. in [place]? --    No data found.  Updated Vital Signs BP 110/76 (BP Location: Right Arm)   Pulse 86   Temp 98.9 F (37.2 C) (Oral)   Resp 20   LMP 02/20/2021   SpO2 99%   Physical Exam Vitals and nursing note reviewed.  Constitutional:      General: She is not in acute distress.    Appearance: Normal appearance. She is not ill-appearing, toxic-appearing or diaphoretic.  HENT:     Head: Normocephalic and atraumatic.  Cardiovascular:     Rate and Rhythm: Normal rate and regular rhythm.     Heart sounds: Normal heart sounds.  Pulmonary:     Effort: Pulmonary effort is normal.     Breath sounds: Normal breath sounds.  Abdominal:     General: Bowel sounds are normal.     Palpations: Abdomen is soft.    Skin:    General: Skin is warm.     Coloration: Skin is  not jaundiced or pale.   Neurological:     Mental Status: She is alert.     UC Treatments / Results  Labs (all labs ordered are listed, but only abnormal results are displayed) Labs Reviewed  POCT URINALYSIS DIPSTICK, ED / UC  POC URINE PREG, ED    EKG   Radiology No results found.  Procedures Procedures (including critical care time)  Medications Ordered in UC Medications - No data to display  Initial Impression / Assessment and Plan / UC Course  I have reviewed the triage vital signs and the nursing notes.  Pertinent labs & imaging results that were available during my care of the patient were reviewed by me and considered in my medical decision making (see chart for details).     New.  hCG negative and no sign of urinary tract infection.  I am concerned that she may have an incarcerated inguinal hernia.  No evidence of gangrene however she will need imaging to confirm that this is or is not a hernia.  If this is an incarcerated hernia it will need to be attempted to be reduced in the ER which we discussed.  If unsuccessful she may need surgery.  We discussed risks of failure to treat. Final Clinical Impressions(s) / UC Diagnoses   Final diagnoses:  None   Discharge Instructions   None    ED Prescriptions   None    PDMP not reviewed this encounter.   Hughie Closs, [place] 03/01/21 1920

## 2021-03-01 NOTE — ED Provider Notes (Signed)
Emergency Medicine Provider Triage Evaluation Note  Taylor Wilkinson 45 y.o. female was evaluated in triage.  Pt complains of lower abd pain/groin pain that has been ongoing for last few days.  She reports went to urgent care today with pain got worse.  They prompted her to go to emergency department for further evaluation.  She has had some nausea.  No vomiting.  No fevers, dysuria, hematuria.  She was told it may be a hernia.  No prior history of hernia.   Review of Systems  Positive: Abdominal pain. Negative: Fevers, n/v, urinary sx  Physical Exam  BP 134/82   Pulse 70   Temp 98.2 F (36.8 C) (Oral)   Resp 18   Ht 5\' 4"  (1.626 m)   Wt 65.8 kg   SpO2 100%   BMI 24.89 kg/m  Gen:   Awake, no distress   HEENT:  Atraumatic  Resp:  Normal effort  Cardiac:  Normal rate  Abd:   Nondistended, tenderness noted to the lower left suprapubic region.  No palpable mass. MSK:   Moves extremities without difficulty  Neuro:  Speech clear   Other:      Medical Decision Making  Medically screening exam initiated at 7:58 PM  Appropriate orders placed.  Taylor Wilkinson was informed that the remainder of the evaluation will be completed by another provider, this initial triage assessment does not replace that evaluation. They are counseled that they will need to remain in the ED until the completion of their workup, including full H&P and results of any tests.  Risks of leaving the emergency department prior to completion of treatment were discussed. Patient was advised to inform ED staff if they are leaving before their treatment is complete. The patient acknowledged these risks and time was allowed for questions.     The patient appears stable so that the remainder of the MSE may be completed by another provider.    Clinical Impression  Abd pain   Portions of this note were generated with Dragon dictation software. Dictation errors may occur despite best attempts at proofreading.      Volanda Napoleon, PA-C 03/01/21 1959    Truddie Hidden, MD 03/01/21 2227

## 2021-03-01 NOTE — Discharge Instructions (Addendum)
Please go to the emergency room for further evaluation.

## 2021-03-01 NOTE — ED Triage Notes (Signed)
Pt arriving from urgent care. Pt states she was told to come to ER for left inguinal hernia. Denies urinary symptoms.

## 2021-03-01 NOTE — ED Notes (Signed)
Patient is being discharged from the Urgent Care and sent to the Emergency Department via POV . Per Nelwyn Salisbury , patient is in need of higher level of care due to hernia. Patient is aware and verbalizes understanding of plan of care.  Vitals:   03/01/21 1742  BP: 110/76  Pulse: 86  Resp: 20  Temp: 98.9 F (37.2 C)  SpO2: 99%

## 2021-03-01 NOTE — ED Notes (Signed)
Family at bedside. 

## 2021-03-02 ENCOUNTER — Emergency Department (HOSPITAL_COMMUNITY): Payer: Managed Care, Other (non HMO)

## 2021-03-02 DIAGNOSIS — R102 Pelvic and perineal pain: Secondary | ICD-10-CM | POA: Diagnosis not present

## 2021-03-02 DIAGNOSIS — D251 Intramural leiomyoma of uterus: Secondary | ICD-10-CM | POA: Diagnosis not present

## 2021-03-02 DIAGNOSIS — D252 Subserosal leiomyoma of uterus: Secondary | ICD-10-CM | POA: Diagnosis not present

## 2021-03-02 DIAGNOSIS — R1032 Left lower quadrant pain: Secondary | ICD-10-CM | POA: Diagnosis not present

## 2021-03-02 LAB — GC/CHLAMYDIA PROBE AMP (~~LOC~~) NOT AT ARMC
Chlamydia: NEGATIVE
Comment: NEGATIVE
Comment: NORMAL
Neisseria Gonorrhea: NEGATIVE

## 2021-03-02 MED ORDER — ONDANSETRON 4 MG PO TBDP: 4.0000 mg | ORAL_TABLET | Freq: Three times a day (TID) | ORAL | 0 refills | Status: DC | PRN

## 2021-03-02 MED ORDER — POTASSIUM CHLORIDE CRYS ER 20 MEQ PO TBCR
40.0000 meq | EXTENDED_RELEASE_TABLET | Freq: Once | ORAL | Status: AC
  Administered 2021-03-02: 40 meq via ORAL

## 2021-03-02 MED ORDER — NAPROXEN 500 MG PO TABS: 500.0000 mg | ORAL_TABLET | Freq: Two times a day (BID) | ORAL | 0 refills | Status: DC | PRN

## 2021-03-02 NOTE — Discharge Instructions (Addendum)
You were seen in the emergency department today for abdominal pain.  Your CT scan and ultrasound did show findings of fibroids but were otherwise reassuring.  Please see attached handout for more information on fibroids.  Your labs show that your potassium was mildly low, please see attached diet guidelines.  We are sending home with the following medicines to help with your symptoms:  - Naproxen is a nonsteroidal anti-inflammatory medication that will help with pain and swelling. Be sure to take this medication as prescribed with food, 1 pill every 12 hours,  It should be taken with food, as it can cause stomach upset, and more seriously, stomach bleeding. Do not take other nonsteroidal anti-inflammatory medications with this such as Advil, Motrin, Aleve, Mobic, Goodie Powder, Diclofenac, voltaren, or Motrin.    - Zofran-this is an antinausea medication, take this every 8 hours as needed.  You make take Tylenol per over the counter dosing with these medications.   We have prescribed you new medication(s) today. Discuss the medications prescribed today with your pharmacist as they can have adverse effects and interactions with your other medicines including over the counter and prescribed medications. Seek medical evaluation if you start to experience new or abnormal symptoms after taking one of these medicines, seek care immediately if you start to experience difficulty breathing, feeling of your throat closing, facial swelling, or rash as these could be indications of a more serious allergic reaction   Please follow-up with primary care and/or OB/GYN within 3 days.  Return to the ER for new or worsening symptoms including but not limited to new or worsening pain, fever, inability to keep fluids down, blood in vomit or stool, passing out, or any other concerns.

## 2021-03-02 NOTE — ED Notes (Signed)
Pt given 2 ginger ales

## 2021-03-03 ENCOUNTER — Telehealth: Payer: Self-pay

## 2021-03-03 ENCOUNTER — Ambulatory Visit: Payer: Managed Care, Other (non HMO) | Admitting: Internal Medicine

## 2021-03-03 NOTE — Telephone Encounter (Signed)
Transition Care Management Follow-up Telephone Call Date of discharge and from where: 03/02/2021 from [place] How have you been since you were released from the hospital? Pt stated that she is feeling much better.  Any questions or concerns? No  Items Reviewed: Did the pt receive and understand the discharge instructions provided? Yes  Medications obtained and verified? Yes  Other? No  Any new allergies since your discharge? No  Dietary orders reviewed? No Do you have support at home? Yes   Functional Questionnaire: (I = Independent and D = Dependent) ADLs: I  Bathing/Dressing- I  Meal Prep- I  Eating- I  Maintaining continence- I  Transferring/Ambulation- I  Managing Meds- I   Follow up appointments reviewed:  PCP Hospital f/u appt confirmed? No   Specialist Hospital f/u appt confirmed? No   Are transportation arrangements needed? No  If their condition worsens, is the pt aware to call PCP or go to the Emergency Dept.? Yes Was the patient provided with contact information for the PCP's office or ED? Yes Was to pt encouraged to call back with questions or concerns? Yes

## 2021-03-04 ENCOUNTER — Other Ambulatory Visit: Payer: Self-pay | Admitting: Family Medicine

## 2021-03-04 DIAGNOSIS — R21 Rash and other nonspecific skin eruption: Secondary | ICD-10-CM

## 2021-03-15 ENCOUNTER — Encounter: Payer: Self-pay | Admitting: Podiatry

## 2021-03-15 ENCOUNTER — Telehealth: Payer: Self-pay | Admitting: Podiatry

## 2021-03-15 NOTE — Telephone Encounter (Signed)
Patient is request extension on last out of work note. Will this be ok?

## 2021-03-15 NOTE — Telephone Encounter (Signed)
Yes we can extend it for 2-3 weeks

## 2021-03-16 ENCOUNTER — Encounter: Payer: Self-pay | Admitting: Podiatry

## 2021-03-16 ENCOUNTER — Other Ambulatory Visit: Payer: Self-pay | Admitting: Internal Medicine

## 2021-04-03 ENCOUNTER — Ambulatory Visit: Payer: Managed Care, Other (non HMO) | Admitting: Internal Medicine

## 2021-04-03 NOTE — Progress Notes (Deleted)
Taylor Wilkinson, female    DOB: 20-Sep-1975,    MRN: [id number]   Brief patient profile:  3   yobf  Active smoker   from [place] moved to [place] and fine until 2010 sob while living in house lots of mold and then moved to present residence 2012 "maint" on alb inhaler, albuterol nebulizer and still symptomatic at new house but finally   Much better while on Breo  But worse off Breo and  but better on advair June 2020 then started singulair 10 mg added Oct 2020 but still doe  referred to pulmonary clinic 07/20/2019 by [name] PA for ?  copd     History of Present Illness  07/20/2019  Pulmonary/ 1st office eval/[facility] [place] Chief Complaint  Patient presents with   Pulmonary Consult    Referred by [name], PA.  Pt states she was dxed with bronchitis 10 yrs ago. She gets SOB when the weather changes. She is using her albuterol inhaler about once per day.  Dyspnea:  Takes neb prior to ex daily o/w says gets tight/ sob with limited ex tol / worse in cold weather Cough: none Sleep: fine now flat SABA use: typically in afternoon with activity  rec Plan A = Automatic = Always=    BREO x one click take 2 puffs each am  Plan B = Backup (to supplement plan A, not to replace it) Only use your albuterol inhaler as a rescue medication Plan C = Crisis (instead of Plan B but only if Plan B stops working) - only use your albuterol nebulizer if you first try Plan B Please schedule a follow up office visit in 6 weeks, call sooner if needed with all medications /inhalers/ solutions in hand     08/31/2019  f/u ov/Febe Champa re:  Copd/cb still smoking mj on Breo 100 Chief Complaint  Patient presents with   Follow-up    Patient reports that she is doing great since starting on the Breo.   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[employer] which is much better  Cough: none Sleeping: fine flat  SABA use: none  02: none  rec No change in medications  - if you have issues with insurance,  we will need to see your Drug Formulary for cheaper alternatives  Breathe clean air  Please schedule a follow up visit in 6  months but call sooner if needed with PFTs on return       03/03/2020  f/u ov/Nikesha Kwasny re:  AB/  Breo q d but not the day of pft/ not vaccinated for covid 67 yet Chief Complaint  Patient presents with   Follow-up    Breathing is doing well and she has been out of her albuterol for 1-2 months.    Dyspnea:  Not limited by breathing from desired activities   Cough: none Sleeping: ok prone  SABA use: none 02: none  Rec Plan A = Automatic = Always=    Breo one click each am  Plan B = Backup (to supplement plan A, not to replace it) Only use your albuterol inhaler as a rescue medication   I strongly recommend you get the covid 19 vaccines thruough your local drugstore asap  Please schedule a follow up visit in 12 months but call sooner if needed     04/03/2021  f/u ov/Kellee Sittner re: asthma maint on  *** No chief complaint on file.   Dyspnea:  *** Cough: *** Sleeping: ***  SABA use: *** 02: *** Covid status:   ***   No obvious day to day or daytime variability or assoc excess/ purulent sputum or mucus plugs or hemoptysis or cp or chest tightness, subjective wheeze or overt sinus or hb symptoms.   *** without nocturnal  or early am exacerbation  of respiratory  c/o's or need for noct saba. Also denies any obvious fluctuation of symptoms with weather or environmental changes or other aggravating or alleviating factors except as outlined above   No unusual exposure hx or h/o childhood pna/ asthma or knowledge of premature birth.  Current Allergies, Complete Past Medical History, Past Surgical History, Family History, and Social History were reviewed in [employer] record.  ROS  The following are not active complaints unless bolded Hoarseness, sore throat, dysphagia, dental problems, itching, sneezing,  nasal congestion or discharge of excess mucus  or purulent secretions, ear ache,   fever, chills, sweats, unintended wt loss or wt gain, classically pleuritic or exertional cp,  orthopnea pnd or arm/hand swelling  or leg swelling, presyncope, palpitations, abdominal pain, anorexia, nausea, vomiting, diarrhea  or change in bowel habits or change in bladder habits, change in stools or change in urine, dysuria, hematuria,  rash, arthralgias, visual complaints, headache, numbness, weakness or ataxia or problems with walking or coordination,  change in mood or  memory.        No outpatient medications have been marked as taking for the 04/03/21 encounter (Appointment) with Tanda Rockers, MD.              Past Medical History:  Diagnosis Date   Asthma    Bronchitis    Miscarriage       Objective:      04/03/2021        ***  03/03/2020      190   08/31/19 197 lb 3.2 oz (89.4 kg)  08/05/19 196 lb 3.2 oz (89 kg)  07/20/19 194 lb (88 kg)     Vital signs reviewed  04/03/2021  - Note at rest 02 sats  ***% on ***   General appearance:    ***           Assessment

## 2021-04-14 ENCOUNTER — Other Ambulatory Visit: Payer: Self-pay | Admitting: Internal Medicine

## 2021-04-20 ENCOUNTER — Encounter: Payer: Self-pay | Admitting: Family Medicine

## 2021-04-20 ENCOUNTER — Ambulatory Visit (INDEPENDENT_AMBULATORY_CARE_PROVIDER_SITE_OTHER): Payer: Managed Care, Other (non HMO) | Admitting: Family Medicine

## 2021-04-20 ENCOUNTER — Telehealth: Payer: Self-pay

## 2021-04-20 ENCOUNTER — Other Ambulatory Visit: Payer: Self-pay

## 2021-04-20 VITALS — BP 122/72 | HR 91 | Wt 179.0 lb

## 2021-04-20 DIAGNOSIS — F4321 Adjustment disorder with depressed mood: Secondary | ICD-10-CM | POA: Diagnosis not present

## 2021-04-20 DIAGNOSIS — F4320 Adjustment disorder, unspecified: Secondary | ICD-10-CM

## 2021-04-20 DIAGNOSIS — J4540 Moderate persistent asthma, uncomplicated: Secondary | ICD-10-CM

## 2021-04-20 DIAGNOSIS — E785 Hyperlipidemia, unspecified: Secondary | ICD-10-CM

## 2021-04-20 NOTE — Assessment & Plan Note (Addendum)
Patient experiencing normal grief after the sudden loss of her brother several months ago. No concern for depression, PHQ-9 score of 3 today.  She is interested in grief counseling and was provided resources in her AVS.

## 2021-04-20 NOTE — Patient Instructions (Addendum)
It was great to meet you!  Things we discussed at today's visit: - We are checking your cholesterol today. We will send you a MyChart message with the results or call if they are abnormal.  - In the meantime, you should quit smoking.  This is the single best thing you can do for your health.  If you need additional help with this, please contact our office - You should also work on lifestyle modifications such as reducing your fast food intake - [email or web address] is an excellent place to find a grief therapist -I will send a new nebulizer to your pharmacy  Take care and seek immediate care sooner if you develop any concerns.  Dr. [facility]

## 2021-04-20 NOTE — Addendum Note (Signed)
Addended by: Alcus Dad on: 04/20/2021 03:19 PM   Modules accepted: Orders

## 2021-04-20 NOTE — Assessment & Plan Note (Addendum)
Lipid panel in September 2020 showed mild elevation of LDL to 117. 10-year ASCVD risk 2.0% using results from prior lipid panel.  We will recheck her cholesterol today given that she does have risk factors including elevated BMI, current smoking, and poor dietary habits. Counseled extensively on lifestyle modifications including dietary changes and smoking cessation. She is motivated to do both of these things.

## 2021-04-20 NOTE — Telephone Encounter (Signed)
Community message sent to [facility] for nebulizer. Will await response.   Ashmi Blas C Zeniya Lapidus, RN  

## 2021-04-20 NOTE — Progress Notes (Addendum)
    SUBJECTIVE:   CHIEF COMPLAINT / HPI:   Discuss Cholesterol Patient requesting to check her cholesterol. States her husband recently found out his cholesterol is high, so she would like hers checked. She last had a lipid panel on 05/13/2019, which showed total cholesterol 178, triglycerides 53, HDL 51, and LDL 117. At that time did not meet criteria for any medications and was advised to make lifestyle modifications. She currently smokes 2 black and milds per day and endorses eating fast food up to 5 times per week.   Grief Patient lost her brother at the end of January after he was shot. She states the whole thing happened so fast. She helped organize the funeral and then was "thrust right back into life".  Did not have much time to properly grieve. Patient denies any significant depressive symptoms but does report some difficulty sleeping.  Reports she and her brother were very close. She is interested in grief counseling.   Nebulizer Replacement Patient needs a new nebulizer machine because her current one is broken (plastic piece broke).   PERTINENT  PMH / PSH: Moderate persistent asthma, smoking (black and mild's, marijuana)  OBJECTIVE:   BP 122/72   Pulse 91   SpO2 98%  General: NAD, pleasant, able to participate in exam Cardiac: RRR, S1 S2 present. normal heart sounds, no murmurs. Respiratory: CTAB, normal effort, No wheezes, rales or rhonchi Extremities: no edema or cyanosis. Skin: warm and dry, no rashes noted Neuro: alert, no obvious focal deficits Psych: Normal affect and mood  ASSESSMENT/PLAN:   Hyperlipidemia Lipid panel in September 2020 showed mild elevation of LDL to 117. 10-year ASCVD risk 2.0% using results from prior lipid panel.  We will recheck her cholesterol today given that she does have risk factors including elevated BMI, current smoking, and poor dietary habits. Counseled extensively on lifestyle modifications including dietary changes and smoking  cessation. She is motivated to do both of these things.  Grief reaction Patient experiencing normal grief after the sudden loss of her brother several months ago. No concern for depression, PHQ-9 score of 3 today.  She is interested in grief counseling and was provided resources in her AVS.  Order placed for new nebulizer machine, as patient's current nebulizer is broken. She has a history of moderate persistent asthma and uses the nebulizer on an as needed basis for asthma exacerbations.  Alcus Dad, MD [place]

## 2021-04-21 LAB — LIPID PANEL
Chol/HDL Ratio: 2.7 ratio (ref 0.0–4.4)
Cholesterol, Total: 178 mg/dL (ref 100–199)
HDL: 65 mg/dL (ref >39)
LDL Chol Calc (NIH): 102 mg/dL — ABNORMAL HIGH (ref 0–99)
Triglycerides: 56 mg/dL (ref 0–149)
VLDL Cholesterol Cal: 11 mg/dL (ref 5–40)

## 2021-04-21 NOTE — Telephone Encounter (Signed)
Please see the below message from [facility].   Received, thank you!  Talbot Grumbling, RN

## 2021-04-30 ENCOUNTER — Other Ambulatory Visit: Payer: Self-pay | Admitting: Family Medicine

## 2021-04-30 DIAGNOSIS — R21 Rash and other nonspecific skin eruption: Secondary | ICD-10-CM

## 2021-05-05 ENCOUNTER — Ambulatory Visit (INDEPENDENT_AMBULATORY_CARE_PROVIDER_SITE_OTHER): Payer: Managed Care, Other (non HMO) | Admitting: Family Medicine

## 2021-05-05 ENCOUNTER — Other Ambulatory Visit: Payer: Self-pay

## 2021-05-05 VITALS — BP 107/80 | HR 91 | Wt 177.0 lb

## 2021-05-05 DIAGNOSIS — J4540 Moderate persistent asthma, uncomplicated: Secondary | ICD-10-CM | POA: Diagnosis not present

## 2021-05-05 DIAGNOSIS — M533 Sacrococcygeal disorders, not elsewhere classified: Secondary | ICD-10-CM | POA: Diagnosis not present

## 2021-05-05 DIAGNOSIS — R21 Rash and other nonspecific skin eruption: Secondary | ICD-10-CM

## 2021-05-05 MED ORDER — ALBUTEROL SULFATE HFA 108 (90 BASE) MCG/ACT IN AERS: INHALATION_SPRAY | RESPIRATORY_TRACT | 0 refills | Status: DC

## 2021-05-05 MED ORDER — TRIAMCINOLONE ACETONIDE 0.1 % EX OINT: TOPICAL_OINTMENT | CUTANEOUS | 1 refills | Status: DC

## 2021-05-05 NOTE — Assessment & Plan Note (Signed)
Patient with acute SI joint dysfunction.  Continue diclofenac on a daily basis, will institute home exercise program for 4 weeks.  Recommend ice/heat, stretching, topical gels such as diclofenac/capsaicin/Salonpas/IcyHot.  Patient to follow-up in 4 weeks, consider formal physical therapy if no improvement with HEP.  Return precautions given.

## 2021-05-05 NOTE — Patient Instructions (Addendum)
It was a pleasure to see you today!  Continue using diclofenac 75 mg daily for pain. You can also use heat or ice Do the SI joint exercises for 4 weeks. If no improvement, follow up as you will need formal PT and maybe imaging. Limit the amount of weight you lift at work to 15 lbs or less for 2 weeks  Be Well,  Dr. Chauncey Reading

## 2021-05-05 NOTE — Addendum Note (Signed)
Addended by: Bridget Hartshorn on: 05/05/2021 07:52 PM   Modules accepted: Orders

## 2021-05-05 NOTE — Progress Notes (Addendum)
    SUBJECTIVE:   CHIEF COMPLAINT / HPI: Back pain  45 year old woman presents with 2 weeks of left lower back pain.  She notes that it is an aching pain that is tender to the touch on the left lower back.  She denies numbness, tingling, sharp shooting pain down the leg.  Pain is located in this 1 spot only and somewhat in her left buttock.  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  She says that she uses good mechanics by using her legs and that her back to lift, wears a back support.  She has been using diclofenac 75 mg daily for pain, which she says helps.  Denies any red flag symptoms.  PERTINENT  PMH / PSH: Non-Contributory  OBJECTIVE:   BP 107/80   Pulse 91   Wt 177 lb (80.3 kg)   LMP 04/20/2021 (Approximate)   BMI 28.57 kg/m   Nursing note and vitals reviewed GEN: Age-appropriate, AAW, resting comfortably in chair, NAD, WNWD, heart and at baseline Lumbar spine: - Inspection: no gross deformity or asymmetry, swelling or ecchymosis. No skin changes - Palpation: +TTP over the left SI joint; No TTP over the spinous processes, or paraspinal muscles. - ROM: full active ROM of the lumbar spine in flexion and extension without pain - Strength: 5/5 strength of lower extremity in L4-S1 nerve root distributions b/l - Neuro: sensation intact in the L4-S1 nerve root distribution b/l, 2+ L4 and S1 reflexes - Straight Leg Raise test: NEG Ext: no edema Psych: Pleasant and appropriate  ASSESSMENT/PLAN:   Sacroiliac joint dysfunction of left side Patient with acute SI joint dysfunction.  Continue diclofenac on a daily basis, will institute home exercise program for 4 weeks.  Recommend ice/heat, stretching, topical gels such as diclofenac/capsaicin/Salonpas/IcyHot.  Patient to follow-up in 4 weeks, consider formal physical therapy if no improvement with HEP.  Return precautions given.     Lenoria Chime, MD [place]

## 2021-05-29 ENCOUNTER — Other Ambulatory Visit: Payer: Self-pay | Admitting: Family Medicine

## 2021-05-29 DIAGNOSIS — J4540 Moderate persistent asthma, uncomplicated: Secondary | ICD-10-CM

## 2021-06-26 ENCOUNTER — Other Ambulatory Visit: Payer: Self-pay

## 2021-06-26 ENCOUNTER — Ambulatory Visit (INDEPENDENT_AMBULATORY_CARE_PROVIDER_SITE_OTHER): Payer: Managed Care, Other (non HMO) | Admitting: Family Medicine

## 2021-06-26 VITALS — BP 118/81 | HR 94

## 2021-06-26 DIAGNOSIS — J029 Acute pharyngitis, unspecified: Secondary | ICD-10-CM | POA: Diagnosis not present

## 2021-06-26 DIAGNOSIS — J4541 Moderate persistent asthma with (acute) exacerbation: Secondary | ICD-10-CM | POA: Diagnosis not present

## 2021-06-26 LAB — POCT INFLUENZA A/B
Influenza A, POC: NEGATIVE
Influenza B, POC: NEGATIVE

## 2021-06-26 MED ORDER — PREDNISONE 20 MG PO TABS: 40.0000 mg | ORAL_TABLET | Freq: Every day | ORAL | 0 refills | Status: AC

## 2021-06-26 NOTE — Patient Instructions (Addendum)
Your test was negative for flu. I feel that your recent illnesses has started a mild asthma exacerbation.  I am going to send in for 5 days of steroids.  If you continue to have extreme shortness of breath and needing to use your albuterol inhaler significantly more than usual then please come back for further evaluation.

## 2021-06-26 NOTE — Progress Notes (Signed)
    SUBJECTIVE:   CHIEF COMPLAINT / HPI:    Patient reports that since the weekend before last she has been feeling sicker with overall myalgias, fatigue, back/shoulder pain.  She reports that last week when trying to work she became extremely short of breath and was having to use her albuterol inhaler every 2 hours or so.  The shortness of breath has continued and she became worried.  She did COVID testing at a [facility] on 10/14 that was negative.  She is wanting to be tested for flu today as well as see if there is anything further we can do.  PERTINENT  PMH / PSH: Reviewed  OBJECTIVE:   BP 118/81   Pulse 94   LMP 05/28/2021   SpO2 100%   Gen: well-appearing, NAD CV: RRR, no m/r/g appreciated, no peripheral edema Pulm: expiratory wheezing diffusely, no area with focal findings suggestive of consolidation at this time   ASSESSMENT/PLAN:   Acute exacerbation of moderate persistent asthma Patient with over 1 week history of wheezing and increased albuterol inhaler use on top of her controller Symbicort.  Physical exam still with diffuse wheezing.  Will prescribe patient 5-day course of 40 mg prednisone.  Patient was given strict return precautions.  Rise Patience, [place]

## 2021-07-04 ENCOUNTER — Other Ambulatory Visit: Payer: Self-pay | Admitting: Family Medicine

## 2021-07-04 ENCOUNTER — Other Ambulatory Visit: Payer: Self-pay | Admitting: Internal Medicine

## 2021-07-04 DIAGNOSIS — J4540 Moderate persistent asthma, uncomplicated: Secondary | ICD-10-CM

## 2021-07-19 ENCOUNTER — Other Ambulatory Visit: Payer: Self-pay | Admitting: Internal Medicine

## 2021-07-19 ENCOUNTER — Other Ambulatory Visit: Payer: Self-pay | Admitting: Family Medicine

## 2021-07-19 DIAGNOSIS — J4540 Moderate persistent asthma, uncomplicated: Secondary | ICD-10-CM

## 2021-07-20 ENCOUNTER — Other Ambulatory Visit: Payer: Self-pay

## 2021-07-20 DIAGNOSIS — J4540 Moderate persistent asthma, uncomplicated: Secondary | ICD-10-CM

## 2021-07-20 MED ORDER — ALBUTEROL SULFATE HFA 108 (90 BASE) MCG/ACT IN AERS: INHALATION_SPRAY | RESPIRATORY_TRACT | 0 refills | Status: DC

## 2021-07-24 ENCOUNTER — Other Ambulatory Visit: Payer: Self-pay | Admitting: Obstetrics

## 2021-07-24 ENCOUNTER — Telehealth: Payer: Self-pay | Admitting: Internal Medicine

## 2021-07-24 DIAGNOSIS — Z1231 Encounter for screening mammogram for malignant neoplasm of breast: Secondary | ICD-10-CM

## 2021-07-24 NOTE — Telephone Encounter (Signed)
I have called the patient and made an OV to get refills. Nothing further needed.

## 2021-07-25 ENCOUNTER — Encounter: Payer: Self-pay | Admitting: Internal Medicine

## 2021-07-25 ENCOUNTER — Ambulatory Visit (INDEPENDENT_AMBULATORY_CARE_PROVIDER_SITE_OTHER): Payer: Managed Care, Other (non HMO) | Admitting: Internal Medicine

## 2021-07-25 ENCOUNTER — Other Ambulatory Visit: Payer: Self-pay

## 2021-07-25 ENCOUNTER — Other Ambulatory Visit: Payer: Self-pay | Admitting: Family Medicine

## 2021-07-25 DIAGNOSIS — F17210 Nicotine dependence, cigarettes, uncomplicated: Secondary | ICD-10-CM

## 2021-07-25 DIAGNOSIS — J4540 Moderate persistent asthma, uncomplicated: Secondary | ICD-10-CM

## 2021-07-25 MED ORDER — BUDESONIDE-FORMOTEROL FUMARATE 160-4.5 MCG/ACT IN AERO: INHALATION_SPRAY | RESPIRATORY_TRACT | 12 refills | Status: DC

## 2021-07-25 NOTE — Assessment & Plan Note (Signed)
Reports smoking cigs/ MJ  - onset of symptoms 2010 improved on Breo/advair - Spirometry 02/13/18  FEV1 2.30 (98%)  Ratio 0.65    - 07/20/2019 Breo 100 x 6 weeks samples given then return with formulary > much better 08/31/2019 but no formulary in hand  - PFT's  02/29/2020  FEV1 2.64 (100 % ) ratio 0.67  p 5 % improvement from saba p 0 prior to study with DLCO  23.63 (102%) corrects to 4.45 (103%)  for alv volume and FV curve mild concavity   - 07/25/2021  After extensive coaching inhaler device,  effectiveness =    75% (short ti)  While on symbicort 160 alone >>> All goals of chronic asthma control met including optimal function and elimination of symptoms with minimal need for rescue therapy.  Contingencies discussed in full including contacting this office immediately if not controlling the symptoms using the rule of two's.     Therefore rec resume symbicort 160 2bid

## 2021-07-25 NOTE — Progress Notes (Signed)
Taylor Wilkinson, female    DOB: [birth year],    MRN: [id number]   Brief patient profile:  61  yobf  Active smoker   from [place] moved to [place] and fine until 2010 sob while living in house lots of mold and then moved to present residence 2012 "maint" on alb inhaler, albuterol nebulizer and still symptomatic at new house but finally   Much better while on Breo  But worse off Breo and  but better on advair June 2020 then started singulair 10 mg added Oct 2020 but still doe  referred to pulmonary clinic 07/20/2019 by Taylor Eagles PA for ?  copd     History of Present Illness  07/20/2019  Pulmonary/ 1st office eval/Taylor Wilkinson Chief Complaint  Patient presents with   Pulmonary Consult    Referred by Taylor Eagles, PA.  Pt states she was dxed with bronchitis 10 yrs ago. She gets SOB when the weather changes. She is using her albuterol inhaler about once per day.  Dyspnea:  Takes neb prior to ex daily o/w says gets tight/ sob with limited ex tol / worse in cold weather Cough: none Sleep: fine now flat SABA use: typically in afternoon with activity  rec Plan A = Automatic = Always=    BREO x one click take 2 puffs each am  Plan B = Backup (to supplement plan A, not to replace it) Only use your albuterol inhaler as a rescue medication Plan C = Crisis (instead of Plan B but only if Plan B stops working) - only use your albuterol nebulizer if you first try Plan B Please schedule a follow up office visit in 6 weeks, call sooner if needed with all medications /inhalers/ solutions in hand     08/31/2019  f/u ov/Taylor Wilkinson re:  Copd/cb still smoking mj on Breo 100 Chief Complaint  Patient presents with   Follow-up    Patient reports that she is doing great since starting on the Breo.   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[employer] which is much better  Cough: none Sleeping: fine flat  SABA use: none  02: none  rec No change in medications  - if you have issues with insurance,  we will need to see your Drug Formulary for cheaper alternatives  Breathe clean air  Please schedule a follow up visit in 6  months but call sooner if needed with PFTs on return       03/03/2020  f/u ov/Taylor Wilkinson re:  AB/  Breo q d but not the day of pft/ not vaccinated for covid 50 yet Chief Complaint  Patient presents with   Follow-up    Breathing is doing well and she has been out of her albuterol for 1-2 months.   Dyspnea:  Not limited by breathing from desired activities   Cough: none Sleeping: ok prone  SABA use: none 02: none  Rec Plan A = Automatic = Always=    Breo one click each am  Plan B = Backup (to supplement plan A, not to replace it) Only use your albuterol inhaler as a rescue medication   I strongly recommend you get the covid 19 vaccines thruough your local drugstore asap    07/25/2021  f/u ov/Taylor Wilkinson re: AB   maint on nothing x sev weeks  / resumed smoking  Chief Complaint  Patient presents with   Follow-up    Ran out of symbicort x 2 wks ago. She has  been using her ventolin about every 4 hours and neb once at night due to "funny feeling in my chest".    Dyspnea:  Not limited by breathing from desired activities    Cough: white mucus  Sleeping: flat / one pillow  SABA use: 2-3 x day hfa/ neb hs only (was min on symb 160) 02: none  Covid status:   vax x one    No obvious day to day or daytime variability or assoc excess/ purulent sputum or mucus plugs or hemoptysis or cp or chest tightness, subjective wheeze or overt sinus or hb symptoms.     Also denies any obvious fluctuation of symptoms with weather or environmental changes or other aggravating or alleviating factors except as outlined above   No unusual exposure hx or h/o childhood pna/ asthma or knowledge of premature birth.  Current Allergies, Complete Past Medical History, Past Surgical History, Family History, and Social History were reviewed in [employer] record.  ROS  The  following are not active complaints unless bolded Hoarseness, sore throat, dysphagia, dental problems, itching, sneezing,  nasal congestion or discharge of excess mucus or purulent secretions, ear ache,   fever, chills, sweats, unintended wt loss or wt gain, classically pleuritic or exertional cp,  orthopnea pnd or arm/hand swelling  or leg swelling, presyncope, palpitations, abdominal pain, anorexia, nausea, vomiting, diarrhea  or change in bowel habits or change in bladder habits, change in stools or change in urine, dysuria, hematuria,  rash, arthralgias, visual complaints, headache, numbness, weakness or ataxia or problems with walking or coordination,  change in mood or  memory.        Current Meds  Medication Sig   albuterol (VENTOLIN HFA) 108 (90 Base) MCG/ACT inhaler INHALE 2 PUFFS EVERY 4 HOURS AS NEEDED ONLY IF YOU CANT CATCH YOUR BREATH   BIOTIN PO Take 2 puffs by mouth every 4 (four) hours as needed.   CALCIUM PO Take 1 capsule by mouth daily.   ipratropium-albuterol (DUONEB) 0.5-2.5 (3) MG/3ML SOLN TAKE 3 MLS BY NEBULIZATION EVERY 4 (FOUR) HOURS AS NEEDED. (Patient taking differently: Take 3 mLs by nebulization every 4 (four) hours as needed (wheezing).)   triamcinolone ointment (KENALOG) 0.1 % Apply to affected area BID             Past Medical History:  Diagnosis Date   Asthma    Bronchitis    Miscarriage       Objective:     07/25/2021    179   03/03/2020      190   08/31/19 197 lb 3.2 oz (89.4 kg)  08/05/19 196 lb 3.2 oz (89 kg)  07/20/19 194 lb (88 kg)     Vital signs reviewed  07/25/2021  - Note at rest 02 sats  100% on RA   General appearance:    amb bf nad   HEENT : pt wearing mask not removed for exam due to covid -19 concerns.    NECK :  without JVD/Nodes/TM/ nl carotid upstrokes bilaterally   LUNGS: no acc muscle use,  Nl contour chest which is clear to A and P bilaterally without cough on insp or exp maneuvers   CV:  RRR  no s3 or murmur or  increase in P2, and no edema   ABD:  soft and nontender with nl inspiratory excursion in the supine position. No bruits or organomegaly appreciated, bowel sounds nl  MS:  Nl gait/ ext warm without deformities, calf tenderness, cyanosis  or clubbing No obvious joint restrictions   SKIN: warm and dry without lesions    NEURO:  alert, approp, nl sensorium with  no motor or cerebellar deficits apparent.            Assessment

## 2021-07-25 NOTE — Assessment & Plan Note (Signed)
Counseled re importance of smoking cessation but did not meet time criteria for separate billing           Each maintenance medication was reviewed in detail including emphasizing most importantly the difference between maintenance and prns and under what circumstances the prns are to be triggered using an action plan format where appropriate.  Total time for H and P, chart review, counseling, reviewing hfa  device(s) and generating customized AVS unique to this office visit / same day charting = 25 min       

## 2021-07-25 NOTE — Patient Instructions (Signed)
Plan A = Automatic = Always=  Resume Symbicort 160 Take 2 puffs first thing in am and then another 2 puffs about 12 hours later.    Work on inhaler technique:  relax and gently blow all the way out then take a nice smooth full deep breath back in, triggering the inhaler at same time you start breathing in.  Hold for up to 5 seconds if you can. Blow out thru nose. Rinse and gargle with water when done.  If mouth or throat bother you at all,  try brushing teeth/gums/tongue with arm and hammer toothpaste/ make a slurry and gargle and spit out.       Plan B = Backup (to supplement plan A, not to replace it) Only use your albuterol inhaler as a rescue medication to be used if you can't catch your breath by resting or doing a relaxed purse lip breathing pattern.  - The less you use it, the better it will work when you need it. - Ok to use the inhaler up to 2 puffs  every 4 hours if you must but call for appointment if use goes up over your usual need - Don't leave home without it !!  (think of it like the spare tire for your car)   Plan C = Crisis (instead of Plan B but only if Plan B stops working) - only use your albuterol nebulizer if you first try Plan B and it fails to help > ok to use the nebulizer up to every 4 hours but if start needing it regularly call for immediate appointment    The key is to stop smoking completely before smoking completely stops you!  Please schedule a follow up visit in 36 months but call sooner if needed

## 2021-08-01 ENCOUNTER — Ambulatory Visit: Payer: Managed Care, Other (non HMO)

## 2021-08-20 ENCOUNTER — Other Ambulatory Visit: Payer: Self-pay | Admitting: Family Medicine

## 2021-08-20 DIAGNOSIS — J4540 Moderate persistent asthma, uncomplicated: Secondary | ICD-10-CM

## 2021-09-02 ENCOUNTER — Other Ambulatory Visit: Payer: Self-pay | Admitting: Obstetrics

## 2021-09-02 DIAGNOSIS — B3731 Acute candidiasis of vulva and vagina: Secondary | ICD-10-CM

## 2021-09-05 NOTE — Telephone Encounter (Signed)
Pt has not been seen since 2020

## 2021-09-06 ENCOUNTER — Ambulatory Visit
Admission: RE | Admit: 2021-09-06 | Discharge: 2021-09-06 | Disposition: A | Discharge status: Home / Self Care | Payer: Managed Care, Other (non HMO) | Source: Ambulatory Visit | Attending: Obstetrics | Admitting: Obstetrics

## 2021-09-06 DIAGNOSIS — Z1231 Encounter for screening mammogram for malignant neoplasm of breast: Secondary | ICD-10-CM

## 2021-09-08 ENCOUNTER — Other Ambulatory Visit: Payer: Self-pay | Admitting: Obstetrics

## 2021-09-08 DIAGNOSIS — R928 Other abnormal and inconclusive findings on diagnostic imaging of breast: Secondary | ICD-10-CM

## 2021-09-28 ENCOUNTER — Ambulatory Visit
Admission: RE | Admit: 2021-09-28 | Discharge: 2021-09-28 | Disposition: A | Discharge status: Home / Self Care | Payer: Managed Care, Other (non HMO) | Source: Ambulatory Visit | Attending: Obstetrics | Admitting: Obstetrics

## 2021-09-28 ENCOUNTER — Ambulatory Visit: Payer: Managed Care, Other (non HMO)

## 2021-09-28 DIAGNOSIS — R928 Other abnormal and inconclusive findings on diagnostic imaging of breast: Secondary | ICD-10-CM

## 2021-10-17 ENCOUNTER — Other Ambulatory Visit: Payer: Managed Care, Other (non HMO)

## 2021-11-20 ENCOUNTER — Ambulatory Visit: Payer: Managed Care, Other (non HMO)

## 2021-11-20 NOTE — Patient Instructions (Incomplete)
It was nice seeing you today!  Blood work today.  See me in 3 months or whenever is a good for you.  Stay well, Selda Jalbert, MD [facility] [phone]  --  Make sure to check out at the front desk before you leave today.  Please arrive at least 15 minutes prior to your scheduled appointments.  If you had blood work today, I will send you a MyChart message or a letter if results are normal. Otherwise, I will give you a call.  If you had a referral placed, they will call you to set up an appointment. Please give us a call if you don't hear back in the next 2 weeks.  If you need additional refills before your next appointment, please call your pharmacy first.  

## 2021-11-20 NOTE — Progress Notes (Deleted)
? ? ?  SUBJECTIVE:  ? ?CHIEF COMPLAINT / HPI:  ?No chief complaint on file. ?  ?*** ? ?PERTINENT  PMH / PSH: *** ? ?Patient Care Team: ?Lattie Haw, MD as PCP - General (Family Medicine)  ? ?OBJECTIVE:  ? ?There were no vitals taken for this visit.  ?Physical Exam  ? ?Depression screen [facility] 2/9 06/26/2021  ?Decreased Interest 0  ?Down, Depressed, Hopeless 0  ?PHQ - 2 Score 0  ?Altered sleeping 3  ?Tired, decreased energy 3  ?Change in appetite 0  ?Feeling bad or failure about yourself  0  ?Trouble concentrating 0  ?Moving slowly or fidgety/restless 0  ?Suicidal thoughts 0  ?PHQ-9 Score 6  ?Difficult doing work/chores -  ?  ? ?{Show previous vital signs (optional):23777} ? ?{Labs  Heme  Chem  Endocrine  Serology  Results Review (optional):23779} ? ?ASSESSMENT/PLAN:  ? ?No problem-specific Assessment & Plan notes found for this encounter. ?  ? ?No follow-ups on file.  ? ?Zola Button, MD ?[place]  ?

## 2021-12-05 ENCOUNTER — Other Ambulatory Visit: Payer: Self-pay | Admitting: Family Medicine

## 2021-12-05 DIAGNOSIS — R21 Rash and other nonspecific skin eruption: Secondary | ICD-10-CM

## 2022-01-03 ENCOUNTER — Encounter: Payer: Self-pay | Admitting: Family Medicine

## 2022-01-03 ENCOUNTER — Ambulatory Visit (INDEPENDENT_AMBULATORY_CARE_PROVIDER_SITE_OTHER): Payer: Managed Care, Other (non HMO) | Admitting: Family Medicine

## 2022-01-03 DIAGNOSIS — J019 Acute sinusitis, unspecified: Secondary | ICD-10-CM

## 2022-01-03 DIAGNOSIS — J301 Allergic rhinitis due to pollen: Secondary | ICD-10-CM | POA: Diagnosis not present

## 2022-01-03 DIAGNOSIS — Z0000 Encounter for general adult medical examination without abnormal findings: Secondary | ICD-10-CM

## 2022-01-03 DIAGNOSIS — J4540 Moderate persistent asthma, uncomplicated: Secondary | ICD-10-CM

## 2022-01-03 MED ORDER — ALBUTEROL SULFATE HFA 108 (90 BASE) MCG/ACT IN AERS: INHALATION_SPRAY | RESPIRATORY_TRACT | 0 refills | Status: DC

## 2022-01-03 MED ORDER — LORATADINE 10 MG PO TABS: 10.0000 mg | ORAL_TABLET | Freq: Every day | ORAL | 11 refills | Status: DC

## 2022-01-03 MED ORDER — FLUTICASONE PROPIONATE 50 MCG/ACT NA SUSP: 2.0000 | Freq: Every day | NASAL | 6 refills | Status: DC

## 2022-01-03 MED ORDER — AMOXICILLIN 875 MG PO TABS: 875.0000 mg | ORAL_TABLET | Freq: Two times a day (BID) | ORAL | 0 refills | Status: AC

## 2022-01-03 MED ORDER — BUDESONIDE-FORMOTEROL FUMARATE 160-4.5 MCG/ACT IN AERO: INHALATION_SPRAY | RESPIRATORY_TRACT | 12 refills | Status: DC

## 2022-01-03 NOTE — Progress Notes (Signed)
? ? ? ?  SUBJECTIVE:  ? ?CHIEF COMPLAINT / HPI:  ? ?Taylor Wilkinson is a 46 y.o. female presents for nasal drainage ? ?Primary symptom: nasal drainage-yellow ?Duration: yesterday  ?Severity: moderate  ?Associated symptoms: itchy sore throat,  facial pressure, tired, light headed, cough (light yellow) ?Fever? Tmax?: none ?Sick contacts: none  ?Covid test: no ?Covid vaccination(s): x 1   ?Has taken a tylenol cold and flu which relieves the pressure sensation from nose.  ?Denies chest pain, palpitations, dyspnea or fevers. ?Ex smoker quit 2 months ago. Smokes marijuana.  ?Needs allergy medicine refill.   ? ?[place] Office Visit from 01/03/2022 in [place]  ?PHQ-9 Total Score 0  ? ?  ?  ?PERTINENT  PMH / PSH: asthma, ex smoker ? ?OBJECTIVE:  ? ?BP 125/84   Pulse 88   Temp 98.5 ?F (36.9 ?C)   Ht '5\' 6"'$  (1.676 m)   Wt 185 lb 6.4 oz (84.1 kg)   LMP 12/23/2021   SpO2 98%   BMI 29.92 kg/m?   ? ?General: Alert, no acute distress ?HEENT: NCAT, mild pharyngeal erythema, no edema, ?Cardio: Normal S1 and S2, RRR, no r/m/g ?Pulm: CTAB, normal work of breathing ?Abdomen: Bowel sounds normal. Abdomen soft and non-tender.  ?Extremities: No peripheral edema.  ?Neuro: Cranial nerves grossly intact  ? ?ASSESSMENT/PLAN:  ? ?Sinusitis ?Likely acute sinusitis, likely viral in origin rather than bacterial.  Normal vital signs and normal exam today.  Explained that most cases are managed with supportive management and are likely to result in resolved illness. Recommended hydration, rest, Tylenol, Motrin as needed, nasal saline spray, nasal Flonase etc. Patient was requesting antibiotics, so used shared decision making for "watchful waiting", if patient's symptoms do not improve over the next 7 to 10 days she can take antibiotics which I am prescribing (amoxicillin for presumed bacterial infection for 7 days).  Follow-up as needed.  Strict ER precautions given to patient. ? ?Health maintenance  examination ?Patient is following up with me for a physical in the next 2 weeks. ?  ? ?Lattie Haw, MD PGY-3 ?[place]  ?

## 2022-01-03 NOTE — Patient Instructions (Addendum)
Thank you for coming to see me today. It was a pleasure. Today we discussed your symptoms, sounds like you have a viral sinus infection. I recommend: tylenol, motrin, fluids, rest, steam inhale etc ? ?Sent in refills to the pharmacy ? ?You can take antibiotics if you are not better within one week. ? ?If you have any questions or concerns, please do not hesitate to call the office at [phone]. ? ?Best wishes,  ? ?Dr Posey Pronto   ?

## 2022-01-04 DIAGNOSIS — J329 Chronic sinusitis, unspecified: Secondary | ICD-10-CM | POA: Insufficient documentation

## 2022-01-04 DIAGNOSIS — Z0000 Encounter for general adult medical examination without abnormal findings: Secondary | ICD-10-CM | POA: Insufficient documentation

## 2022-01-04 NOTE — Assessment & Plan Note (Addendum)
Likely acute sinusitis, likely viral in origin rather than bacterial.  Normal vital signs and normal exam today.  Explained that most cases are managed with supportive management and are likely to result in resolved illness. Recommended hydration, rest, Tylenol, Motrin as needed, nasal saline spray, nasal Flonase etc. Patient was requesting antibiotics, so used shared decision making for "watchful waiting", if patient's symptoms do not improve over the next 7 to 10 days she can take antibiotics which I am prescribing (amoxicillin for presumed bacterial infection for 7 days).  Follow-up as needed.  Strict ER precautions given to patient. ?

## 2022-01-04 NOTE — Assessment & Plan Note (Signed)
Patient is following up with me for a physical in the next 2 weeks. ?

## 2022-01-16 ENCOUNTER — Ambulatory Visit: Payer: Managed Care, Other (non HMO) | Admitting: Family Medicine

## 2022-01-16 NOTE — Progress Notes (Deleted)
? ? ? ?  SUBJECTIVE:  ? ?CHIEF COMPLAINT / HPI:  ? ?Taylor Wilkinson is a 46 y.o. female presents for *** ? ? ?*** ? ?[place] Office Visit from 01/03/2022 in [place]  ?PHQ-9 Total Score 0  ? ?  ?  ? ?Health Maintenance Due  ?Topic  ? TETANUS/TDAP   ? COLONOSCOPY (Pts 45-50yr Insurance coverage will need to be confirmed)   ? COVID-19 Vaccine (2 - Pfizer series)  ? ?  ? ?PERTINENT  PMH / PSH:  ? ?OBJECTIVE:  ? ?LMP 12/23/2021   ? ?General: Alert, no acute distress ?Cardio: Normal S1 and S2, RRR, no r/m/g ?Pulm: CTAB, normal work of breathing ?Abdomen: Bowel sounds normal. Abdomen soft and non-tender.  ?Extremities: No peripheral edema.  ?Neuro: Cranial nerves grossly intact  ? ?ASSESSMENT/PLAN:  ? ?No problem-specific Assessment & Plan notes found for this encounter. ?  ? ?PLattie Haw MD PGY-3 ?[place]. ?

## 2022-01-16 NOTE — Progress Notes (Deleted)
    SUBJECTIVE:   Chief compliant/HPI: annual examination  Taylor Wilkinson is a 46 y.o. who presents today for an annual exam.   History tabs reviewed and updated ***.   Review of systems form reviewed and notable for ***.   OBJECTIVE:   LMP 12/23/2021   ***  ASSESSMENT/PLAN:   No problem-specific Assessment & Plan notes found for this encounter.    Annual Examination  See AVS for age appropriate recommendations.   PHQ score ***, reviewed and discussed.  Blood pressure reviewed and at goal ***.  Asked about intimate partner violence and resources given as appropriate  The patient currently uses *** for contraception. Folate recommended as appropriate, minimum of 400 mcg per day.   Considered the following items based upon USPSTF recommendations: Diabetes screening: {discussed/ordered:14545} Screening for elevated cholesterol: {discussed/ordered:14545} HIV testing: {discussed/ordered:14545} Hepatitis C: {discussed/ordered:14545} Hepatitis B: {discussed/ordered:14545} Syphilis if at high risk: {discussed/ordered:14545} GC/CT {GC/CT screening :23818} Reviewed risk factors for latent tuberculosis and {not indicated/requested/declined:14582} Reviewed risk factors for osteoporosis. Using FRAX tool estimated risk of major osteoporotic fracture of  ***%, early screening {ordered not order:23822::"not ordered"}   Discussed family history, BRCA testing {not indicated/requested/declined:14582}. Tool used to risk stratify was ***.  Cervical cancer screening: {PAPTYPE:23819} Breast cancer screening: {mammoscreen:23820} Colorectal cancer screening: {crcscreen:23821::"discussed, colonoscopy ordered"} if age 90 or over.   Follow up in 1 *** year or sooner if indicated.    Lattie Haw, MD [place]

## 2022-01-22 ENCOUNTER — Ambulatory Visit: Payer: Managed Care, Other (non HMO) | Admitting: Internal Medicine

## 2022-01-23 IMAGING — CT CT ABD-PELV W/ CM
2 of 4 series · 16 of 46 positions shown, 18 images · IV contrast (omnipaque)
Comparison: None.

CLINICAL DATA: Acute nonlocalized abdominal pain, left inguinal
pain

EXAM:
CT ABDOMEN AND PELVIS WITH CONTRAST
TECHNIQUE: Multidetector CT imaging of the abdomen and pelvis was performed
using the standard protocol following bolus administration of
intravenous contrast.
CONTRAST:  100mL OMNIPAQUE IOHEXOL 300 MG/ML  SOLN

[Series 2: axial st · axial · 0.74mm/px · z∈[+1143,+1558]mm · 13 of 95 slices shown, 15 images]
[im 6/95  soft-tissue]
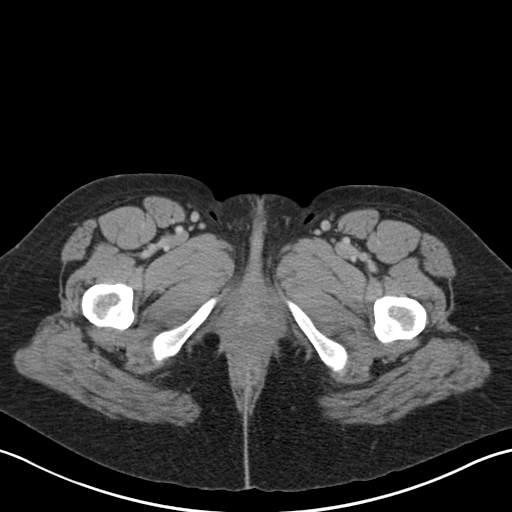
[im 6/95  bone]
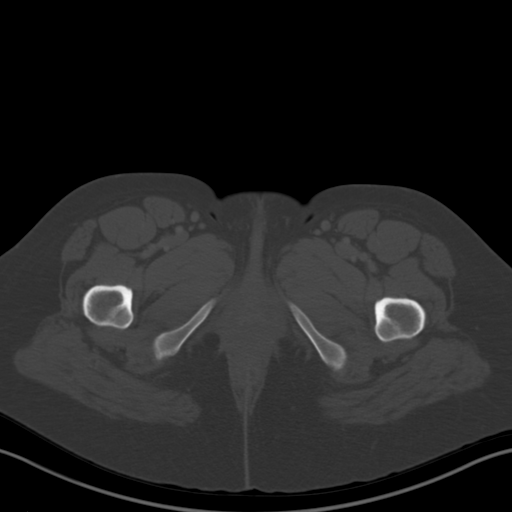
[im 12/95  soft-tissue]
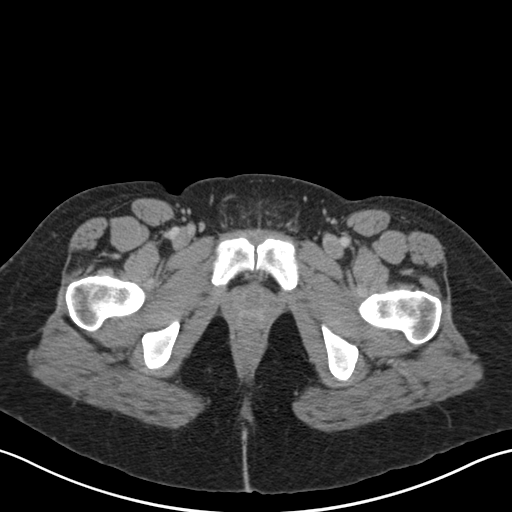
[im 23/95  soft-tissue]
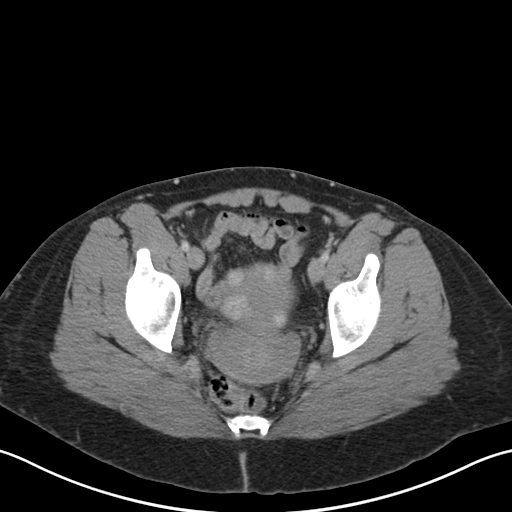
[im 28/95  soft-tissue]
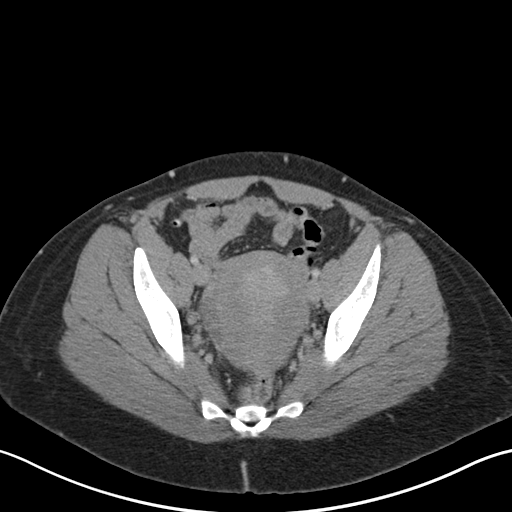
[im 34/95  soft-tissue]
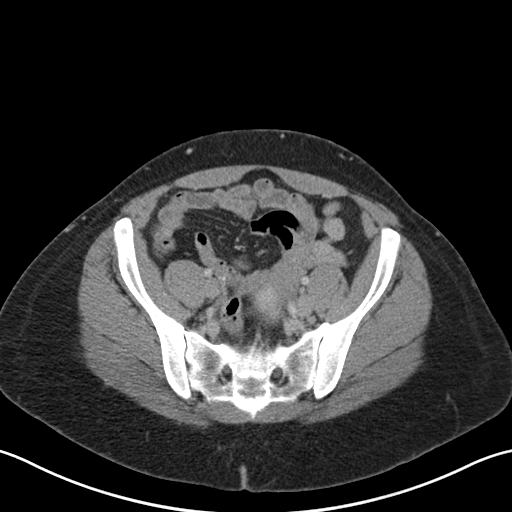
[im 39/95  soft-tissue]
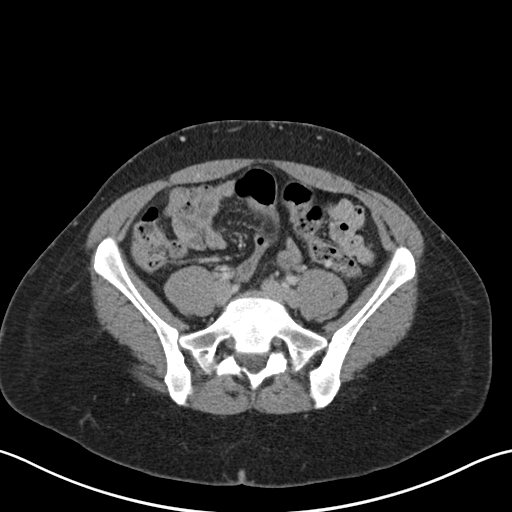
[im 50/95  soft-tissue]
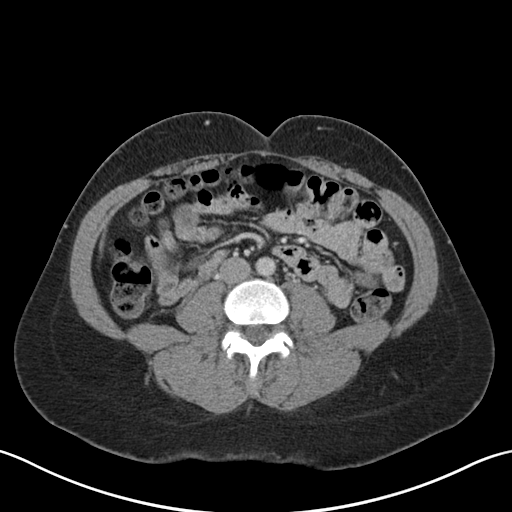
[im 56/95  soft-tissue]
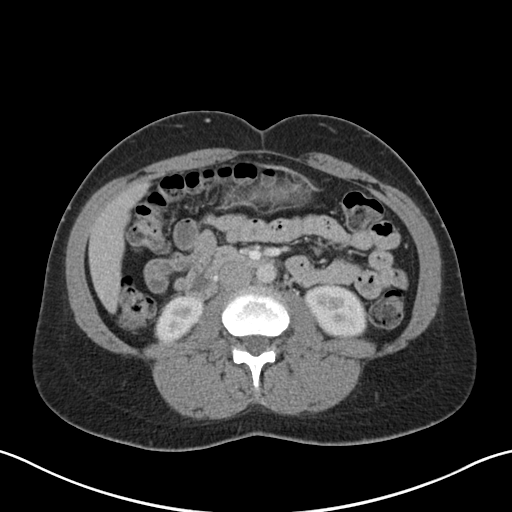
[im 61/95  soft-tissue]
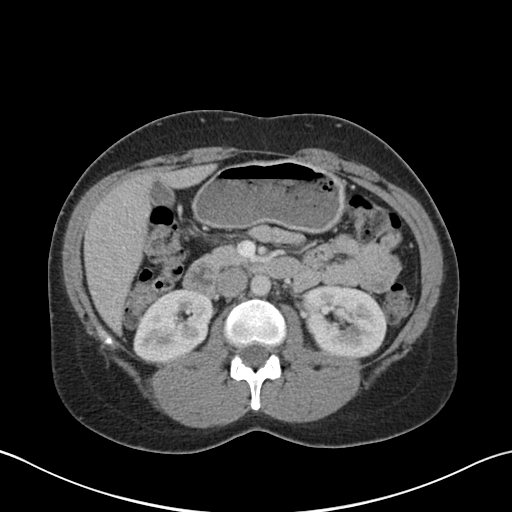
[im 61/95  bone]
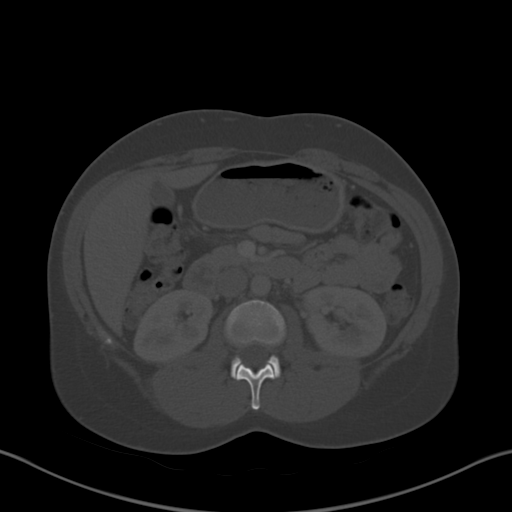
[im 67/95  soft-tissue]
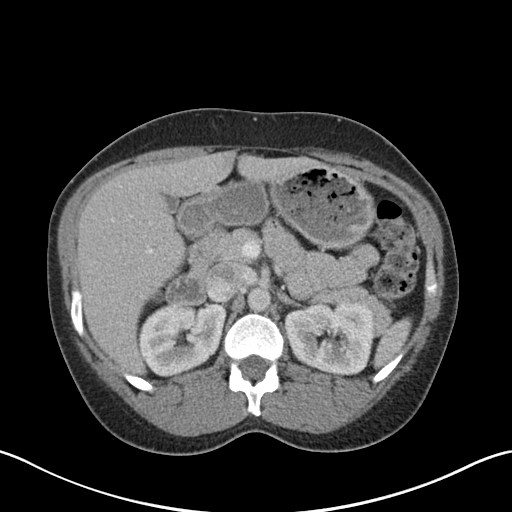
[im 72/95  soft-tissue]
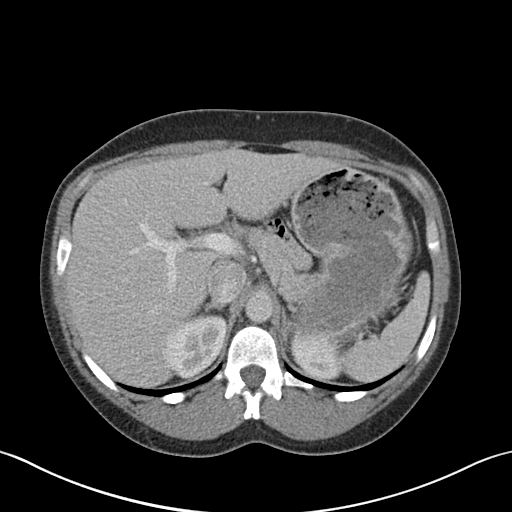
[im 83/95  soft-tissue]
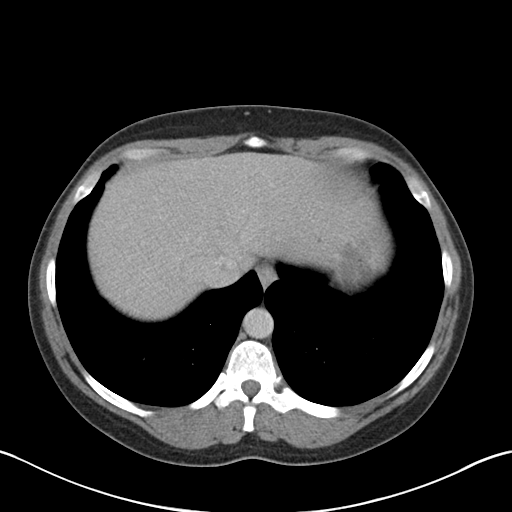
[im 89/95  soft-tissue]
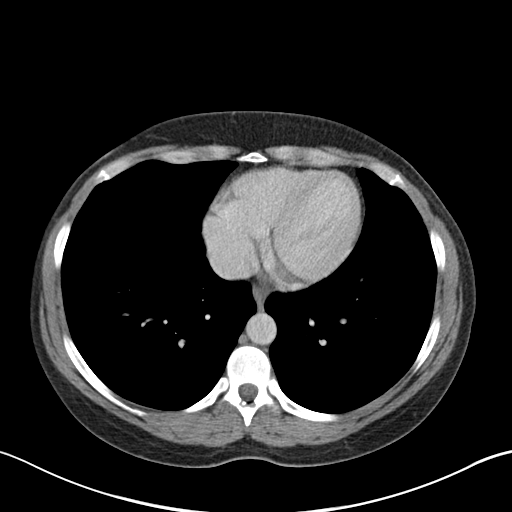

[Series 4: coronal st · coronal · 0.67mm/px · 3 of 151 slices shown]
[im 51/151  soft-tissue]
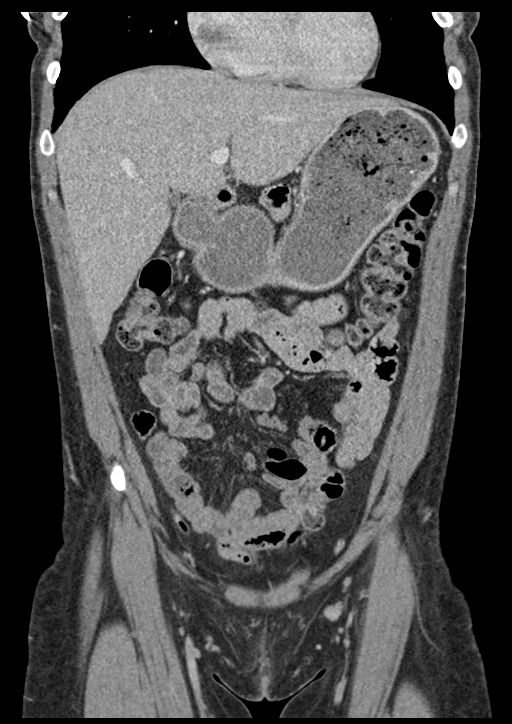
[im 67/151  soft-tissue]
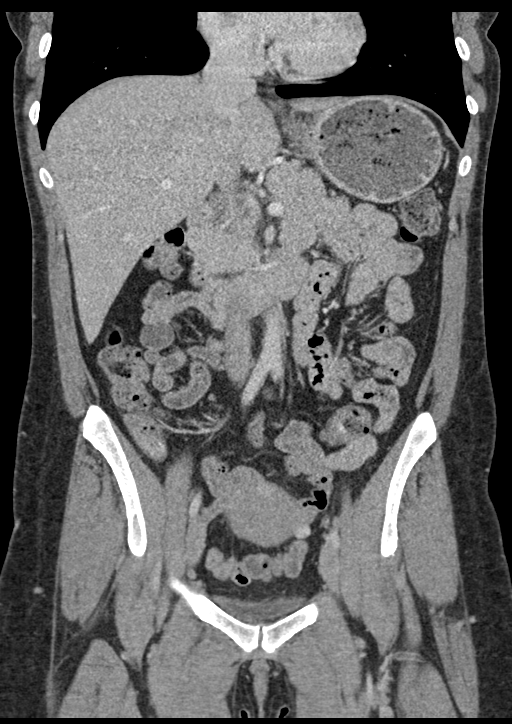
[im 84/151  soft-tissue]
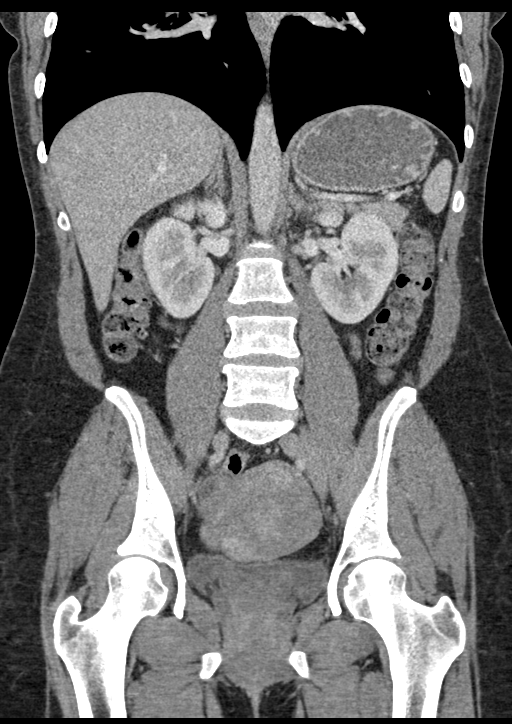

[16 of 46 positions shown; findings below may reference images not displayed]

FINDINGS: Lower chest: The visualized lung bases are clear bilaterally. The
visualized heart and pericardium are unremarkable.

Hepatobiliary: No focal liver abnormality is seen. No gallstones,
gallbladder wall thickening, or biliary dilatation.

Pancreas: Unremarkable

Spleen: Unremarkable

Adrenals/Urinary Tract: Adrenal glands are unremarkable. Kidneys are
normal, without renal calculi, focal lesion, or hydronephrosis.
Bladder is unremarkable.

Stomach/Bowel: Stomach is within normal limits. Appendix appears
normal. No evidence of bowel wall thickening, distention, or
inflammatory changes. No free intraperitoneal gas or fluid.

Vascular/Lymphatic: No significant vascular findings are present. No
enlarged abdominal or pelvic lymph nodes.

Reproductive: Multiple enhancing masses seen within the uterus
likely represent multiple small uterine fibroids measuring up to 2
cm. The pelvic organs are otherwise unremarkable.

Other: Tiny fat containing umbilical hernia. The rectum is
unremarkable. The left inguinal canal is unremarkable.

Musculoskeletal: No acute or significant osseous findings.
IMPRESSION: No acute intra-abdominal pathology identified. No definite
radiographic explanation for the patient's reported symptoms.

Multiple small probable uterine fibroids.

## 2022-01-29 ENCOUNTER — Ambulatory Visit: Payer: Managed Care, Other (non HMO) | Admitting: Internal Medicine

## 2022-01-29 NOTE — Progress Notes (Deleted)
Taylor Wilkinson, female    DOB: 1975/09/26,    MRN: [id number]   Brief patient profile:  86  yobf  active smoker   from [place] moved to [place] and fine until 2010 sob while living in house lots of mold and then moved to present residence 2012 "maint" on alb inhaler, albuterol nebulizer and still symptomatic at new house but finally   Much better while on Breo  But worse off Breo and  but better on advair June 2020 then started singulair 10 mg added Oct 2020 but still doe  referred to pulmonary clinic 07/20/2019 by [name] PA for ?  copd     History of Present Illness  07/20/2019  Pulmonary/ 1st office eval/Florestine Carmical Chief Complaint  Patient presents with   Pulmonary Consult    Referred by [name], PA.  Pt states she was dxed with bronchitis 10 yrs ago. She gets SOB when the weather changes. She is using her albuterol inhaler about once per day.  Dyspnea:  Takes neb prior to ex daily o/w says gets tight/ sob with limited ex tol / worse in cold weather Cough: none Sleep: fine now flat SABA use: typically in afternoon with activity  rec Plan A = Automatic = Always=    BREO x one click take 2 puffs each am  Plan B = Backup (to supplement plan A, not to replace it) Only use your albuterol inhaler as a rescue medication Plan C = Crisis (instead of Plan B but only if Plan B stops working) - only use your albuterol nebulizer if you first try Plan B Please schedule a follow up office visit in 6 weeks, call sooner if needed with all medications /inhalers/ solutions in hand        07/25/2021  f/u ov/Landin Tallon re: AB   maint on nothing x sev weeks  / resumed smoking  Chief Complaint  Patient presents with   Follow-up    Ran out of symbicort x 2 wks ago. She has been using her ventolin about every 4 hours and neb once at night due to "funny feeling in my chest".   Dyspnea:  Not limited by breathing from desired activities    Cough: white mucus  Sleeping: flat / one pillow  SABA use: 2-3 x  day hfa/ neb hs only (was min on symb 160) 02: none  Covid status:   vax x one  Rec Plan A = Automatic = Always=  Resume Symbicort 160 Take 2 puffs first thing in am and then another 2 puffs about 12 hours later.  Work on inhaler technique:  Plan B = Backup (to supplement plan A, not to replace it) Only use your albuterol inhaler as a rescue medication Plan C = Crisis (instead of Plan B but only if Plan B stops working) - only use your albuterol nebulizer if you first try Plan B and it fails to help  The key is to stop smoking completely before smoking completely stops you!      01/29/2022  f/u ov/Captola Teschner re: ***   maint on ***  No chief complaint on file.   Dyspnea:  *** Cough: *** Sleeping: *** SABA use: *** 02: *** Covid status:   ***   No obvious day to day or daytime variability or assoc excess/ purulent sputum or mucus plugs or hemoptysis or cp or chest tightness, subjective wheeze or overt sinus or hb symptoms.   *** without nocturnal  or early  am exacerbation  of respiratory  c/o's or need for noct saba. Also denies any obvious fluctuation of symptoms with weather or environmental changes or other aggravating or alleviating factors except as outlined above   No unusual exposure hx or h/o childhood pna/ asthma or knowledge of premature birth.  Current Allergies, Complete Past Medical History, Past Surgical History, Family History, and Social History were reviewed in [employer] record.  ROS  The following are not active complaints unless bolded Hoarseness, sore throat, dysphagia, dental problems, itching, sneezing,  nasal congestion or discharge of excess mucus or purulent secretions, ear ache,   fever, chills, sweats, unintended wt loss or wt gain, classically pleuritic or exertional cp,  orthopnea pnd or arm/hand swelling  or leg swelling, presyncope, palpitations, abdominal pain, anorexia, nausea, vomiting, diarrhea  or change in bowel habits or change  in bladder habits, change in stools or change in urine, dysuria, hematuria,  rash, arthralgias, visual complaints, headache, numbness, weakness or ataxia or problems with walking or coordination,  change in mood or  memory.        No outpatient medications have been marked as taking for the 01/29/22 encounter (Appointment) with Tanda Rockers, MD.             Past Medical History:  Diagnosis Date   Asthma    Bronchitis    Miscarriage       Objective:    Wts  01/29/2022        ***   07/25/2021    179   03/03/2020      190   08/31/19 197 lb 3.2 oz (89.4 kg)  08/05/19 196 lb 3.2 oz (89 kg)  07/20/19 194 lb (88 kg)      Vital signs reviewed  01/29/2022  - Note at rest 02 sats  ***% on ***   General appearance:    ***          Assessment

## 2022-01-30 ENCOUNTER — Other Ambulatory Visit (HOSPITAL_COMMUNITY): Payer: Self-pay

## 2022-01-31 ENCOUNTER — Ambulatory Visit (INDEPENDENT_AMBULATORY_CARE_PROVIDER_SITE_OTHER): Payer: Managed Care, Other (non HMO) | Admitting: Podiatry

## 2022-01-31 DIAGNOSIS — Z91199 Patient's noncompliance with other medical treatment and regimen due to unspecified reason: Secondary | ICD-10-CM

## 2022-01-31 NOTE — Progress Notes (Signed)
No show

## 2022-02-01 ENCOUNTER — Other Ambulatory Visit: Payer: Self-pay | Admitting: Obstetrics

## 2022-02-13 ENCOUNTER — Ambulatory Visit: Payer: Managed Care, Other (non HMO) | Admitting: Internal Medicine

## 2022-02-13 ENCOUNTER — Encounter: Payer: Self-pay | Admitting: *Deleted

## 2022-03-04 NOTE — Progress Notes (Deleted)
Taylor Wilkinson, female    DOB: 04-01-1976,    MRN: [id number]   Brief patient profile:  72  yobf  Active smoker   from [place] moved to [place] and fine until 2010 sob while living in house lots of mold and then moved to present residence 2012 "maint" on alb inhaler, albuterol nebulizer and still symptomatic at new house but finally   Much better while on Breo  But worse off Breo and  but better on advair June 2020 then started singulair 10 mg added Oct 2020 but still doe  referred to pulmonary clinic 07/20/2019 by [name] PA for ?  copd     History of Present Illness  07/20/2019  Pulmonary/ 1st office eval/Taylor Wilkinson Chief Complaint  Patient presents with   Pulmonary Consult    Referred by [name], PA.  Pt states she was dxed with bronchitis 10 yrs ago. She gets SOB when the weather changes. She is using her albuterol inhaler about once per day.  Dyspnea:  Takes neb prior to ex daily o/w says gets tight/ sob with limited ex tol / worse in cold weather Cough: none Sleep: fine now flat SABA use: typically in afternoon with activity  rec Plan A = Automatic = Always=    BREO x one click take 2 puffs each am  Plan B = Backup (to supplement plan A, not to replace it) Only use your albuterol inhaler as a rescue medication Plan C = Crisis (instead of Plan B but only if Plan B stops working) - only use your albuterol nebulizer if you first try Plan B Please schedule a follow up office visit in 6 weeks, call sooner if needed with all medications /inhalers/ solutions in hand       07/25/2021  f/u ov/Taylor Wilkinson re: AB   maint on nothing x sev weeks  / resumed smoking  Chief Complaint  Patient presents with   Follow-up    Ran out of symbicort x 2 wks ago. She has been using her ventolin about every 4 hours and neb once at night due to "funny feeling in my chest".    Dyspnea:  Not limited by breathing from desired activities    Cough: white mucus  Sleeping: flat / one pillow  SABA use: 2-3 x  day hfa/ neb hs only (was min on symb 160) 02: none  Covid status:   vax x one  Rec Plan A = Automatic = Always=  Resume Symbicort 160 Take 2 puffs first thing in am and then another 2 puffs about 12 hours later.  Work on inhaler technique:   Plan B = Backup (to supplement plan A, not to replace it) Only use your albuterol inhaler as a rescue medication  Plan C = Crisis (instead of Plan B but only if Plan B stops working) - only use your albuterol nebulizer if you first try Plan B  The key is to stop smoking completely before smoking completely stops you!      03/05/2022  f/u ov/Taylor Wilkinson re: AB   maint on ***  No chief complaint on file.   Dyspnea:  *** Cough: *** Sleeping: *** SABA use: *** 02: *** Covid status:   ***   No obvious day to day or daytime variability or assoc excess/ purulent sputum or mucus plugs or hemoptysis or cp or chest tightness, subjective wheeze or overt sinus or hb symptoms.   *** without nocturnal  or early am exacerbation  of respiratory  c/o's or need for noct saba. Also denies any obvious fluctuation of symptoms with weather or environmental changes or other aggravating or alleviating factors except as outlined above   No unusual exposure hx or h/o childhood pna/ asthma or knowledge of premature birth.  Current Allergies, Complete Past Medical History, Past Surgical History, Family History, and Social History were reviewed in [employer] record.  ROS  The following are not active complaints unless bolded Hoarseness, sore throat, dysphagia, dental problems, itching, sneezing,  nasal congestion or discharge of excess mucus or purulent secretions, ear ache,   fever, chills, sweats, unintended wt loss or wt gain, classically pleuritic or exertional cp,  orthopnea pnd or arm/hand swelling  or leg swelling, presyncope, palpitations, abdominal pain, anorexia, nausea, vomiting, diarrhea  or change in bowel habits or change in bladder habits,  change in stools or change in urine, dysuria, hematuria,  rash, arthralgias, visual complaints, headache, numbness, weakness or ataxia or problems with walking or coordination,  change in mood or  memory.        No outpatient medications have been marked as taking for the 03/05/22 encounter (Appointment) with Tanda Rockers, MD.             Past Medical History:  Diagnosis Date   Asthma    Bronchitis    Miscarriage       Objective:    Wts  03/05/2022        ***   07/25/2021    179   03/03/2020      190   08/31/19 197 lb 3.2 oz (89.4 kg)  08/05/19 196 lb 3.2 oz (89 kg)  07/20/19 194 lb (88 kg)    Vital signs reviewed  03/05/2022  - Note at rest 02 sats  ***% on ***   General appearance:    ***             Assessment

## 2022-03-05 ENCOUNTER — Ambulatory Visit: Payer: Managed Care, Other (non HMO) | Admitting: Internal Medicine

## 2022-03-09 ENCOUNTER — Ambulatory Visit: Payer: Managed Care, Other (non HMO) | Admitting: Internal Medicine

## 2022-03-09 NOTE — Progress Notes (Deleted)
Taylor Wilkinson, female    DOB: 08-Oct-1975,    MRN: [id number]   Brief patient profile:  66  yobf  Active smoker   from [place] moved to [place] and fine until 2010 sob while living in house lots of mold and then moved to present residence 2012 "maint" on alb inhaler, albuterol nebulizer and still symptomatic at new house but finally   Much better while on Breo  But worse off Breo and  but better on advair June 2020 then started singulair 10 mg added Oct 2020 but still doe  referred to pulmonary clinic 07/20/2019 by Taylor Eagles PA for ?  copd     History of Present Illness  07/20/2019  Pulmonary/ 1st office eval/Taylor Wilkinson Chief Complaint  Patient presents with   Pulmonary Consult    Referred by Taylor Eagles, PA.  Pt states she was dxed with bronchitis 10 yrs ago. She gets SOB when the weather changes. She is using her albuterol inhaler about once per day.  Dyspnea:  Takes neb prior to ex daily o/w says gets tight/ sob with limited ex tol / worse in cold weather Cough: none Sleep: fine now flat SABA use: typically in afternoon with activity  rec Plan A = Automatic = Always=    BREO x one click take 2 puffs each am  Plan B = Backup (to supplement plan A, not to replace it) Only use your albuterol inhaler as a rescue medication Plan C = Crisis (instead of Plan B but only if Plan B stops working) - only use your albuterol nebulizer if you first try Plan B Please schedule a follow up office visit in 6 weeks, call sooner if needed with all medications /inhalers/ solutions in hand       07/25/2021  f/u ov/Taylor Wilkinson re: AB   maint on nothing x sev weeks  / resumed smoking  Chief Complaint  Patient presents with   Follow-up    Ran out of symbicort x 2 wks ago. She has been using her ventolin about every 4 hours and neb once at night due to "funny feeling in my chest".    Dyspnea:  Not limited by breathing from desired activities    Cough: white mucus  Sleeping: flat / one pillow  SABA use: 2-3 x  day hfa/ neb hs only (was min on symb 160) 02: none  Covid status:   vax x one  Rec Plan A = Automatic = Always=  Resume Symbicort 160 Take 2 puffs first thing in am and then another 2 puffs about 12 hours later.  Work on inhaler technique:   Plan B = Backup (to supplement plan A, not to replace it) Only use your albuterol inhaler as a rescue medication  Plan C = Crisis (instead of Plan B but only if Plan B stops working) - only use your albuterol nebulizer if you first try Plan B  The key is to stop smoking completely before smoking completely stops you!      03/09/2022  f/u ov/Taylor Wilkinson re: AB   maint on ***  No chief complaint on file.   Dyspnea:  *** Cough: *** Sleeping: *** SABA use: *** 02: *** Covid status:   ***   No obvious day to day or daytime variability or assoc excess/ purulent sputum or mucus plugs or hemoptysis or cp or chest tightness, subjective wheeze or overt sinus or hb symptoms.   *** without nocturnal  or early am exacerbation  of respiratory  c/o's or need for noct saba. Also denies any obvious fluctuation of symptoms with weather or environmental changes or other aggravating or alleviating factors except as outlined above   No unusual exposure hx or h/o childhood pna/ asthma or knowledge of premature birth.  Current Allergies, Complete Past Medical History, Past Surgical History, Family History, and Social History were reviewed in [employer] record.  ROS  The following are not active complaints unless bolded Hoarseness, sore throat, dysphagia, dental problems, itching, sneezing,  nasal congestion or discharge of excess mucus or purulent secretions, ear ache,   fever, chills, sweats, unintended wt loss or wt gain, classically pleuritic or exertional cp,  orthopnea pnd or arm/hand swelling  or leg swelling, presyncope, palpitations, abdominal pain, anorexia, nausea, vomiting, diarrhea  or change in bowel habits or change in bladder habits,  change in stools or change in urine, dysuria, hematuria,  rash, arthralgias, visual complaints, headache, numbness, weakness or ataxia or problems with walking or coordination,  change in mood or  memory.        No outpatient medications have been marked as taking for the 03/09/22 encounter (Appointment) with Tanda Rockers, MD.             Past Medical History:  Diagnosis Date   Asthma    Bronchitis    Miscarriage       Objective:    Wts  03/09/2022        ***   07/25/2021    179   03/03/2020      190   08/31/19 197 lb 3.2 oz (89.4 kg)  08/05/19 196 lb 3.2 oz (89 kg)  07/20/19 194 lb (88 kg)    Vital signs reviewed  03/09/2022  - Note at rest 02 sats  ***% on ***   General appearance:    ***             Assessment

## 2022-03-18 ENCOUNTER — Other Ambulatory Visit: Payer: Self-pay | Admitting: Family Medicine

## 2022-03-18 DIAGNOSIS — J4540 Moderate persistent asthma, uncomplicated: Secondary | ICD-10-CM

## 2022-04-01 ENCOUNTER — Other Ambulatory Visit: Payer: Self-pay | Admitting: Family Medicine

## 2022-04-01 DIAGNOSIS — R21 Rash and other nonspecific skin eruption: Secondary | ICD-10-CM

## 2022-04-18 ENCOUNTER — Other Ambulatory Visit: Payer: Self-pay | Admitting: Family Medicine

## 2022-04-18 DIAGNOSIS — J4540 Moderate persistent asthma, uncomplicated: Secondary | ICD-10-CM

## 2022-05-16 ENCOUNTER — Other Ambulatory Visit: Payer: Self-pay | Admitting: Family Medicine

## 2022-05-16 DIAGNOSIS — R21 Rash and other nonspecific skin eruption: Secondary | ICD-10-CM

## 2022-05-23 ENCOUNTER — Other Ambulatory Visit: Payer: Self-pay | Admitting: Family Medicine

## 2022-05-23 DIAGNOSIS — J4540 Moderate persistent asthma, uncomplicated: Secondary | ICD-10-CM

## 2022-06-09 ENCOUNTER — Other Ambulatory Visit: Payer: Self-pay | Admitting: Family Medicine

## 2022-06-09 DIAGNOSIS — R21 Rash and other nonspecific skin eruption: Secondary | ICD-10-CM

## 2022-06-10 ENCOUNTER — Other Ambulatory Visit: Payer: Self-pay | Admitting: Family Medicine

## 2022-06-10 DIAGNOSIS — J4540 Moderate persistent asthma, uncomplicated: Secondary | ICD-10-CM

## 2022-06-11 ENCOUNTER — Other Ambulatory Visit: Payer: Self-pay | Admitting: Family Medicine

## 2022-06-11 DIAGNOSIS — J4540 Moderate persistent asthma, uncomplicated: Secondary | ICD-10-CM

## 2022-07-18 ENCOUNTER — Other Ambulatory Visit: Payer: Self-pay | Admitting: Family Medicine

## 2022-07-18 DIAGNOSIS — J4540 Moderate persistent asthma, uncomplicated: Secondary | ICD-10-CM

## 2022-07-19 ENCOUNTER — Other Ambulatory Visit (HOSPITAL_COMMUNITY): Payer: Self-pay

## 2022-07-25 ENCOUNTER — Other Ambulatory Visit (HOSPITAL_COMMUNITY): Payer: Self-pay

## 2022-08-03 ENCOUNTER — Other Ambulatory Visit: Payer: Self-pay | Admitting: Family Medicine

## 2022-08-03 DIAGNOSIS — J4540 Moderate persistent asthma, uncomplicated: Secondary | ICD-10-CM

## 2022-08-03 DIAGNOSIS — R21 Rash and other nonspecific skin eruption: Secondary | ICD-10-CM

## 2022-09-15 ENCOUNTER — Other Ambulatory Visit: Payer: Self-pay | Admitting: Family Medicine

## 2022-09-15 DIAGNOSIS — R21 Rash and other nonspecific skin eruption: Secondary | ICD-10-CM

## 2022-09-15 DIAGNOSIS — J4540 Moderate persistent asthma, uncomplicated: Secondary | ICD-10-CM

## 2022-10-02 ENCOUNTER — Encounter: Payer: Self-pay | Admitting: Family Medicine

## 2022-10-02 ENCOUNTER — Ambulatory Visit (INDEPENDENT_AMBULATORY_CARE_PROVIDER_SITE_OTHER): Payer: Managed Care, Other (non HMO) | Admitting: Family Medicine

## 2022-10-02 VITALS — BP 136/84 | HR 89 | Ht 66.0 in | Wt 196.4 lb

## 2022-10-02 DIAGNOSIS — J4540 Moderate persistent asthma, uncomplicated: Secondary | ICD-10-CM | POA: Diagnosis not present

## 2022-10-02 MED ORDER — BUDESONIDE-FORMOTEROL FUMARATE 160-4.5 MCG/ACT IN AERO: 2.0000 | INHALATION_SPRAY | Freq: Two times a day (BID) | RESPIRATORY_TRACT | 0 refills | Status: DC

## 2022-10-02 MED ORDER — ALBUTEROL SULFATE HFA 108 (90 BASE) MCG/ACT IN AERS: INHALATION_SPRAY | RESPIRATORY_TRACT | 2 refills | Status: DC

## 2022-10-02 NOTE — Progress Notes (Unsigned)
    SUBJECTIVE:   CHIEF COMPLAINT / HPI:   Asthma Follow-Up -Was told she needs to come in before her albuterol inhaler would be refilled -Previously on Symbicort, stopped due to cost reasons, been off for several months -Uses her albuterol maybe once per day, uses it when exerting herself at work, gets relief from this   PERTINENT  PMH / PSH: ***  OBJECTIVE:   BP 136/84   Pulse 89   Ht '5\' 6"'$  (1.676 m)   Wt 196 lb 6.4 oz (89.1 kg)   LMP 09/21/2022   SpO2 99%   BMI 31.70 kg/m   ***  ASSESSMENT/PLAN:   No problem-specific Assessment & Plan notes found for this encounter.     Alcus Dad, MD [place]

## 2022-10-02 NOTE — Patient Instructions (Addendum)
It was great to see you!  -Please schedule a follow-up with the pulmonologist -We should resume a daily controller medication for your asthma. I have sent Symbicort to your pharmacy. Take this every day. If you have issues with cost, please contact our office. -When you're exerting yourself at work, see if your symptoms improve after a few minutes of rest and calming deep breaths. -Try to only use your albuterol inhaler for true shortness of breath, wheezing, or chest tightness. -Continue to work on quitting smoking entirely.   Take care, Dr Rock Nephew

## 2022-10-03 NOTE — Assessment & Plan Note (Addendum)
Patient with moderate persistent asthma based on the need for daily SABA use. Would benefit from step-up therapy, therefore will resume Symbicort 160 2puffs BID for maintenance. She will let me know if this is cost-prohibitive. Reviewed differences between maintenance and rescue inhalers with patient. Discussed true indications for rescue inhaler use as she is currently using it quite liberally. Advised routine pulm f/u and smoking cessation.

## 2022-11-01 ENCOUNTER — Other Ambulatory Visit: Payer: Self-pay | Admitting: Family Medicine

## 2022-11-01 DIAGNOSIS — R21 Rash and other nonspecific skin eruption: Secondary | ICD-10-CM

## 2022-12-14 ENCOUNTER — Other Ambulatory Visit: Payer: Self-pay | Admitting: Family Medicine

## 2022-12-14 DIAGNOSIS — R21 Rash and other nonspecific skin eruption: Secondary | ICD-10-CM

## 2023-01-12 ENCOUNTER — Other Ambulatory Visit: Payer: Self-pay | Admitting: Family Medicine

## 2023-01-12 DIAGNOSIS — J4540 Moderate persistent asthma, uncomplicated: Secondary | ICD-10-CM

## 2023-01-22 ENCOUNTER — Other Ambulatory Visit: Payer: Self-pay | Admitting: Family Medicine

## 2023-01-22 DIAGNOSIS — R21 Rash and other nonspecific skin eruption: Secondary | ICD-10-CM

## 2023-01-29 ENCOUNTER — Other Ambulatory Visit: Payer: Self-pay

## 2023-01-29 MED ORDER — IPRATROPIUM-ALBUTEROL 0.5-2.5 (3) MG/3ML IN SOLN: 3.0000 mL | RESPIRATORY_TRACT | 0 refills | Status: DC | PRN

## 2023-02-06 ENCOUNTER — Telehealth: Payer: Self-pay

## 2023-02-06 NOTE — Telephone Encounter (Signed)
Patient calls nurse line requesting Prednisone.   She reports increased SOB over the last several days. She reports she has been using her inhaler and nebulizer solution, however doesn't feel much relief.   She denies any fevers or wheezing. She was speaking in full sentences on the phone.   We do not have any apts today and she reports she is going out of town tomorrow morning [place].   Advised will send to PCP.   Precautions discussed.

## 2023-02-07 NOTE — Telephone Encounter (Signed)
Recommend patient schedule virtual visit today or tomorrow to discuss.   Maury Dus, MD PGY-3, [facility]

## 2023-02-08 NOTE — Telephone Encounter (Signed)
Called patient. Patient in [place] right now.   Having productive cough with white sputum. Using inhaler about every two hours.   Patient traveling home tomorrow. Scheduled patient for office visit eval on Monday morning.   ED precautions discussed.  Veronda Prude, RN

## 2023-02-11 ENCOUNTER — Ambulatory Visit (INDEPENDENT_AMBULATORY_CARE_PROVIDER_SITE_OTHER): Payer: Self-pay | Admitting: Family Medicine

## 2023-02-11 ENCOUNTER — Other Ambulatory Visit: Payer: Self-pay

## 2023-02-11 VITALS — BP 145/60 | HR 85 | Ht 66.0 in | Wt 188.2 lb

## 2023-02-11 DIAGNOSIS — I10 Essential (primary) hypertension: Secondary | ICD-10-CM | POA: Insufficient documentation

## 2023-02-11 DIAGNOSIS — J45901 Unspecified asthma with (acute) exacerbation: Secondary | ICD-10-CM

## 2023-02-11 DIAGNOSIS — R030 Elevated blood-pressure reading, without diagnosis of hypertension: Secondary | ICD-10-CM

## 2023-02-11 MED ORDER — BUDESONIDE-FORMOTEROL FUMARATE 160-4.5 MCG/ACT IN AERO
2.0000 | INHALATION_SPRAY | Freq: Two times a day (BID) | RESPIRATORY_TRACT | 0 refills | Status: DC
  Filled 2023-02-11: qty 10.2, 30d supply, fill #0
  Filled 2023-02-11: qty 1, fill #0

## 2023-02-11 MED ORDER — PREDNISONE 20 MG PO TABS
40.0000 mg | ORAL_TABLET | Freq: Every day | ORAL | 0 refills | Status: AC
  Filled 2023-02-11: qty 10, 5d supply, fill #0

## 2023-02-11 NOTE — Patient Instructions (Signed)
I am sending steroids and another inhaler to the [facility]. I would talk to the pharmacist there and see if they are able to help you through their assistance program to get your inhaler.  The Symbicort is the daily inhaler that you need to take. Albuterol is the as needed medication for shortness of breath.   Please return if you continue to have shortness of breath next week and go to the ER if it becomes severe.

## 2023-02-11 NOTE — Assessment & Plan Note (Signed)
BP elevated in the office, currently is sick with mild asthma exacerbation so expect elevations temporarily.  Discussed with patient to check her blood pressures at home and if consistently > 140/90 then she needs to call the office to be seen for another appointment to address possible hypertension.

## 2023-02-11 NOTE — Progress Notes (Signed)
    SUBJECTIVE:   CHIEF COMPLAINT / HPI:   Productive Cough - Began several weeks ago, coughing up thin phlegm  - Sometimes has some shortness of breath with activity and wheezing in the last week - Worse at night - No fevers - Not using Symbicort and hasn't had for >6 months - Having issues with her insurance since she no longer is working and is waiting on [employer] - Using albuterol every 2 hours, new inhaler has used >100 doses in the last week - Feels some chest tightness - Previously seen in January 024 for asthma and restarted on symbicort BID - Last saw pulmonology in Nov 2022  HTN - No diagnosis of HTN - Is currently sick - Is able to take BP at home as she has a cuff for her husband  PERTINENT  PMH / PSH: moderate persistent asthma, seasonal allergies  OBJECTIVE:   BP (!) 145/60   Pulse 85   Ht 5\' 6"  (1.676 m)   Wt 188 lb 3.2 oz (85.4 kg)   LMP 01/19/2023   SpO2 100%   BMI 30.38 kg/m   Gen: well-appearing, NAD CV: RRR, no m/r/g appreciated, no peripheral edema Pulm: diffuse wheezing worse in the LLL, no shortness of breath, able to speak in full sentences HEENT: TM clear bilaterally, no cervical LAD, no sinus tenderness, no pharyngeal erythema or exudates  ASSESSMENT/PLAN:   Mild Asthma Exacerbation Physical exam and history concerning for mild asthma exacerbation.  Has moderate persistent asthma and has been unable to afford daily inhaler medication other than albuterol.  Currently using albuterol every 2 hours for the last week.  Currently does not have insurance and inhalers are expensive for her, we do not have any samples available in the clinic today but prescriptions will be sent to community pharmacy and routed to pharmacy team for financial assistance. - Symbicort 160 mg twice daily - Prednisone 40 mg daily x 5 days - Strict ER and return precautions discussed  Elevated blood pressure reading BP elevated in the office, currently is sick with mild asthma  exacerbation so expect elevations temporarily.  Discussed with patient to check her blood pressures at home and if consistently > 140/90 then she needs to call the office to be seen for another appointment to address possible hypertension.   Evelena Leyden, DO [facility]

## 2023-02-12 ENCOUNTER — Other Ambulatory Visit (HOSPITAL_COMMUNITY): Payer: Self-pay

## 2023-02-20 ENCOUNTER — Telehealth: Payer: Self-pay

## 2023-02-20 NOTE — Telephone Encounter (Signed)
Submitted application for BREYNA INHALER to [employer] for patient assistance.   Phone: [phone]  (Instead of symbicort)

## 2023-03-11 ENCOUNTER — Other Ambulatory Visit: Payer: Self-pay | Admitting: Family Medicine

## 2023-03-11 ENCOUNTER — Encounter: Payer: Self-pay | Admitting: Family Medicine

## 2023-03-11 DIAGNOSIS — R21 Rash and other nonspecific skin eruption: Secondary | ICD-10-CM

## 2023-03-11 MED ORDER — FLUTICASONE PROPIONATE 50 MCG/ACT NA SUSP: 2.0000 | Freq: Every day | NASAL | 6 refills | Status: DC

## 2023-03-28 ENCOUNTER — Other Ambulatory Visit: Payer: Self-pay | Admitting: Obstetrics

## 2023-03-28 ENCOUNTER — Other Ambulatory Visit: Payer: Self-pay | Admitting: Family Medicine

## 2023-03-28 DIAGNOSIS — Z0000 Encounter for general adult medical examination without abnormal findings: Secondary | ICD-10-CM

## 2023-03-28 DIAGNOSIS — Z1231 Encounter for screening mammogram for malignant neoplasm of breast: Secondary | ICD-10-CM

## 2023-03-29 ENCOUNTER — Ambulatory Visit
Admission: RE | Admit: 2023-03-29 | Discharge: 2023-03-29 | Disposition: A | Discharge status: Home / Self Care | Payer: 59 | Source: Ambulatory Visit | Attending: Family Medicine | Admitting: Family Medicine

## 2023-03-29 DIAGNOSIS — Z1231 Encounter for screening mammogram for malignant neoplasm of breast: Secondary | ICD-10-CM

## 2023-04-11 ENCOUNTER — Ambulatory Visit (INDEPENDENT_AMBULATORY_CARE_PROVIDER_SITE_OTHER): Payer: 59 | Admitting: Family Medicine

## 2023-04-11 ENCOUNTER — Encounter: Payer: Self-pay | Admitting: Family Medicine

## 2023-04-11 ENCOUNTER — Other Ambulatory Visit (HOSPITAL_COMMUNITY)
Admission: RE | Admit: 2023-04-11 | Discharge: 2023-04-11 | Disposition: A | Discharge status: Home / Self Care | Payer: 59 | Source: Ambulatory Visit | Attending: Family Medicine | Admitting: Family Medicine

## 2023-04-11 VITALS — BP 138/80 | HR 97 | Ht 65.0 in | Wt 191.0 lb

## 2023-04-11 DIAGNOSIS — Z114 Encounter for screening for human immunodeficiency virus [HIV]: Secondary | ICD-10-CM | POA: Diagnosis not present

## 2023-04-11 DIAGNOSIS — Z113 Encounter for screening for infections with a predominantly sexual mode of transmission: Secondary | ICD-10-CM | POA: Diagnosis not present

## 2023-04-11 DIAGNOSIS — N898 Other specified noninflammatory disorders of vagina: Secondary | ICD-10-CM | POA: Diagnosis not present

## 2023-04-11 DIAGNOSIS — J4540 Moderate persistent asthma, uncomplicated: Secondary | ICD-10-CM | POA: Diagnosis not present

## 2023-04-11 DIAGNOSIS — E7800 Pure hypercholesterolemia, unspecified: Secondary | ICD-10-CM

## 2023-04-11 DIAGNOSIS — J301 Allergic rhinitis due to pollen: Secondary | ICD-10-CM

## 2023-04-11 MED ORDER — FLUCONAZOLE 150 MG PO TABS: 150.0000 mg | ORAL_TABLET | Freq: Once | ORAL | 0 refills | Status: AC

## 2023-04-11 MED ORDER — LORATADINE 10 MG PO TABS
10.0000 mg | ORAL_TABLET | Freq: Every day | ORAL | 11 refills | Status: DC
Start: 2023-04-11 — End: 2024-04-13

## 2023-04-11 MED ORDER — BUDESONIDE-FORMOTEROL FUMARATE 160-4.5 MCG/ACT IN AERO: 2.0000 | INHALATION_SPRAY | Freq: Two times a day (BID) | RESPIRATORY_TRACT | 0 refills | Status: DC

## 2023-04-11 MED ORDER — IPRATROPIUM-ALBUTEROL 0.5-2.5 (3) MG/3ML IN SOLN: 3.0000 mL | RESPIRATORY_TRACT | 0 refills | Status: AC | PRN

## 2023-04-11 MED ORDER — ALBUTEROL SULFATE HFA 108 (90 BASE) MCG/ACT IN AERS
INHALATION_SPRAY | RESPIRATORY_TRACT | 2 refills | Status: DC
Start: 2023-04-11 — End: 2023-07-03

## 2023-04-11 MED ORDER — TERCONAZOLE 0.8 % VA CREA: 1.0000 | TOPICAL_CREAM | Freq: Every day | VAGINAL | 1 refills | Status: AC

## 2023-04-11 NOTE — Patient Instructions (Signed)
Thank you for coming to clinic today - it was great to meet you!  Today we discussed your overall health and refilled the medications you've been low on. We also conducted a cervical exam which appeared normal. We conducted STI/STD testing, and checked your cholesterol as well. We will follow up with you as we receive results.   We also discussed starting to see a therapist, which is such a wonderful thing to do for everyone! Please reach out to your health insurance company for further clarification on your coverage.  Please contact us with any questions you may have. We are always here for you!  Ivery Quale, MD

## 2023-04-12 ENCOUNTER — Other Ambulatory Visit: Payer: Self-pay | Admitting: Family Medicine

## 2023-04-12 ENCOUNTER — Encounter: Payer: Self-pay | Admitting: Family Medicine

## 2023-04-12 NOTE — Progress Notes (Signed)
    SUBJECTIVE:   CHIEF COMPLAINT / HPI:   Annual physical: Overall, Taylor Wilkinson feels like her health has been stable, no significant changes recently. She has an upcoming trip and would like to refill her asthma medications beforehand for reassurance in case she needs them. She otherwise would like to be up to date on her maintenance labs, screenings, etc.  Vulvar Itchiness: For about two weeks now, patient has been experiencing vulvar itchiness. No vaginal itchiness, no discharge, no pain with sex. No dysuria, no changes in urine frequency, no blood in urine, no changes in urine color.   STI/STD testing: Patient recently learned that her partner of 15 years has a history of herpes. Patient would like to learn about her status and risks. She has not noticed any blistering rashes in herself or her partner. She is sexually active with one female partner, no condom use, no contraceptive use.   Mental health: Patient shares that her brother's life was tragically taken in recent times. She feels like she has been processing over time and would like to speak with a therapist now. No SI/HI. Interested in trying talk therapy before any psych medications.   PERTINENT  PMH / PSH: Asthma, Hyperlipidemia  OBJECTIVE:   BP 138/80   Pulse 97   Ht 5\' 5"  (1.651 m)   Wt 191 lb (86.6 kg)   LMP 03/22/2023   SpO2 98%   BMI 31.78 kg/m   General: Well-appearing, resting comfortably in room Cardio: Normal S1/S2. No extra heart sounds. Warm and well-perfused. Pulm: CTAB. No increased WOB. Abd: Soft, nontender, nondistended Skin: Warm, dry GU: Dry, erythematous rash predominantly on R labia majora. Normal appearing cervix, no erythema, no lesions. Normal whitish discharge noted in vaginal canal.  Psych: Alert and oriented. Appropriate mentation and affect.   ASSESSMENT/PLAN:   Annual physical: Given stable health recently, will refill her long term medications. - Lipid panel today - Discussed  colonoscopy - patient will think it over and discuss at a further time - Refilled albuterol, symbicort, duonebs, and claritin   Vulvar itchiness: Symptoms and physical exam consistent with yeast infection.  - Terconazole 0.8%  STI/STD: Normal cervical exam. Health maintenance STI/STD screening. - G/C, HIV, RPR tests collected - Dicussed the low utility of HSV blood tests. Advised about condom use, monitoring for symptoms, abstaining from sex during flare ups.   Mental health: PHQ-9 score of 2 today.  - Discussed reaching out to her insurance company to find out which therapists may be covered by her policy - Messaged patient with other means of finding a therapist ([facility], [facility]help)   Ivery Quale, MD [facility]

## 2023-04-14 ENCOUNTER — Encounter: Payer: Self-pay | Admitting: Family Medicine

## 2023-04-15 ENCOUNTER — Telehealth: Payer: Self-pay | Admitting: Family Medicine

## 2023-04-15 ENCOUNTER — Other Ambulatory Visit: Payer: Self-pay | Admitting: Family Medicine

## 2023-04-15 MED ORDER — METRONIDAZOLE 500 MG PO TABS
500.0000 mg | ORAL_TABLET | Freq: Two times a day (BID) | ORAL | 0 refills | Status: DC
Start: 2023-04-15 — End: 2024-04-14

## 2023-04-15 NOTE — Telephone Encounter (Signed)
Called and spoke with patient about recent lab results. Discussed slightly elevated cholesterol levels, and how statins are not indicated at this time. Encouraged healthy lifestyle behaviors and recheck in 1 year. Patient to pick up metronidazole prescription for symptomatic BV.

## 2023-04-16 NOTE — Addendum Note (Signed)
Addended by: Ivery Quale on: 04/16/2023 08:59 PM   Modules accepted: Level of Service

## 2023-04-16 NOTE — Telephone Encounter (Signed)
Spoke with [employer] rep.  Application incomplete. Will update provider and provider pages.

## 2023-04-29 ENCOUNTER — Other Ambulatory Visit (HOSPITAL_COMMUNITY): Payer: Self-pay

## 2023-04-29 NOTE — Telephone Encounter (Signed)
Received notification from [employer] regarding patient assistance DENIAL for BREYNA INHALER.  Reason: Pt now has Scientist, research (physical sciences) also doesn't cover [place] or Symbicort. Advair Diskus is covered for copay of $40 on patients insurance - 30 day supply  Phone: [phone]

## 2023-05-06 ENCOUNTER — Ambulatory Visit (INDEPENDENT_AMBULATORY_CARE_PROVIDER_SITE_OTHER): Payer: 59

## 2023-05-06 DIAGNOSIS — Z0184 Encounter for antibody response examination: Secondary | ICD-10-CM | POA: Diagnosis not present

## 2023-05-06 DIAGNOSIS — Z111 Encounter for screening for respiratory tuberculosis: Secondary | ICD-10-CM

## 2023-05-06 DIAGNOSIS — Z23 Encounter for immunization: Secondary | ICD-10-CM | POA: Diagnosis not present

## 2023-05-06 NOTE — Progress Notes (Unsigned)
Patient presents to nurse clinic for TB screening. Patient also has [employer] form that needs to be completed. Form is asking about vaccination status. Verbal orders for immunity titers and Quantiferon Gold given by Dr. Perley Jain.   Patient is also in need for Tdap vaccination.   Administered in RD, site unremarkable, tolerated injection well.   Clinical information completed on form. Form discussed and given to PCP.   Veronda Prude, RN

## 2023-05-07 ENCOUNTER — Encounter: Payer: Self-pay | Admitting: Family Medicine

## 2023-05-08 NOTE — Progress Notes (Signed)
Reviewed and agree.

## 2023-05-09 LAB — QUANTIFERON-TB GOLD PLUS
QuantiFERON Mitogen Value: >10 [IU]/mL
QuantiFERON Nil Value: 0 [IU]/mL
QuantiFERON TB1 Ag Value: 0.02 IU/mL
QuantiFERON TB2 Ag Value: 0.02 [IU]/mL
QuantiFERON-TB Gold Plus: NEGATIVE

## 2023-05-09 LAB — MEASLES/MUMPS/RUBELLA IMMUNITY
MUMPS ABS, IGG: 11.7 AU/mL (ref >10.9)
RUBEOLA AB, IGG: >300 [AU]/ml (ref >16.4)
Rubella Antibodies, IGG: 4.33 {index} (ref >0.99)

## 2023-05-09 LAB — HEPATITIS B SURFACE ANTIBODY, QUANTITATIVE: Hepatitis B Surf Ab Quant: <3.5 m[IU]/mL — ABNORMAL LOW

## 2023-05-09 LAB — VARICELLA ZOSTER ANTIBODY, IGG: Varicella zoster IgG: 2357 {index} (ref >165)

## 2023-05-10 ENCOUNTER — Telehealth: Payer: Self-pay

## 2023-05-10 NOTE — Telephone Encounter (Signed)
Received message from Dr. McDiarmid regarding results from patient's labs. Patient non-immune for Hep B.   Please advise once form is ready, so that patient can be scheduled for nurse visit for Hep B vaccination and to pick up form.   Veronda Prude, RN

## 2023-05-20 ENCOUNTER — Ambulatory Visit (INDEPENDENT_AMBULATORY_CARE_PROVIDER_SITE_OTHER): Payer: 59

## 2023-05-20 DIAGNOSIS — Z23 Encounter for immunization: Secondary | ICD-10-CM

## 2023-05-20 NOTE — Progress Notes (Signed)
Patient presents to nurse clinic for Hepatitis B vaccination. Administered in RD, site unremarkable, tolerated injection well.   Provided patient with updated immunization record and health assessment form that was previously completed by Dr. Threasa Beards.   Copy of paperwork placed in batch scanning.   Veronda Prude, RN

## 2023-05-20 NOTE — Telephone Encounter (Signed)
Form in RN box and complete.   Patient coming in this afternoon for Hep B vaccine.  Form is at my desk in RN room.

## 2023-05-22 ENCOUNTER — Other Ambulatory Visit: Payer: Self-pay

## 2023-05-22 MED ORDER — BUDESONIDE-FORMOTEROL FUMARATE 160-4.5 MCG/ACT IN AERO: 2.0000 | INHALATION_SPRAY | Freq: Two times a day (BID) | RESPIRATORY_TRACT | 0 refills | Status: DC

## 2023-05-29 ENCOUNTER — Other Ambulatory Visit (HOSPITAL_COMMUNITY): Payer: Self-pay

## 2023-05-29 ENCOUNTER — Telehealth: Payer: Self-pay

## 2023-05-29 ENCOUNTER — Other Ambulatory Visit: Payer: Self-pay

## 2023-05-29 NOTE — Telephone Encounter (Signed)
Patient calls nurse line in regards to Symbicort inhaler.   Patient was upset and does not understand why this was not sent to her pharmacy. Patient advised we sent this prescription in on 9/11.  She reports she spoke to [facility] yesterday and they stated they did not receive anything.   I do see where her insurance prefers Advair for a 40 dollar copay. See mychart message from 8/27. I advised patient this may be the issue, however I will need to call [facility] for more information.   I tried calling [facility] without being able to get through.   I sent a message to pharmacy for further assistance.

## 2023-05-29 NOTE — Telephone Encounter (Signed)
Spoke with [employer].   She reports the patients insurance will cover generic advair diskus for a 40 dollar copay.   Please send in to pharmacy.   Will forward to PCP.

## 2023-05-31 ENCOUNTER — Other Ambulatory Visit: Payer: Self-pay | Admitting: Family Medicine

## 2023-05-31 DIAGNOSIS — J4541 Moderate persistent asthma with (acute) exacerbation: Secondary | ICD-10-CM

## 2023-05-31 MED ORDER — FLUTICASONE-SALMETEROL 100-50 MCG/ACT IN AEPB: 1.0000 | INHALATION_SPRAY | Freq: Two times a day (BID) | RESPIRATORY_TRACT | 3 refills | Status: DC

## 2023-06-17 ENCOUNTER — Ambulatory Visit (INDEPENDENT_AMBULATORY_CARE_PROVIDER_SITE_OTHER): Payer: 59

## 2023-06-17 DIAGNOSIS — Z23 Encounter for immunization: Secondary | ICD-10-CM

## 2023-06-17 NOTE — Progress Notes (Signed)
Patient presents to nurse clinic today for second Hepatitis B vaccination.   Administered in LD, site unremarkable, tolerated injection well. Provided patient with updated immunization record.    During visit, patient states that she has concerns with left ear. Reports cleaning ear with Q-tip and is unsure if wax was pushed down further into ear.   No clinic availability for today. Scheduled patient for further evaluation by provider tomorrow morning.   Veronda Prude, RN

## 2023-06-18 ENCOUNTER — Ambulatory Visit (INDEPENDENT_AMBULATORY_CARE_PROVIDER_SITE_OTHER): Payer: 59 | Admitting: Family Medicine

## 2023-06-18 VITALS — BP 120/80 | HR 88 | Wt 199.2 lb

## 2023-06-18 DIAGNOSIS — H6122 Impacted cerumen, left ear: Secondary | ICD-10-CM

## 2023-06-18 NOTE — Progress Notes (Signed)
    SUBJECTIVE:   CHIEF COMPLAINT / HPI:   Left ear fullness since Friday Patient feels like she has wax buildup and has noticed a sense of fullness and decreased hearing due to this.  She generally uses Q-tips to clean out her ears and she tried doing this but feels like she placed in the wax further Denies any pain or swelling, no fevers, no headaches, no dizziness   PERTINENT  PMH / PSH: Asthma  OBJECTIVE:   BP 120/80   Pulse 88   Wt 199 lb 4 oz (90.4 kg)   LMP 06/03/2023   SpO2 99%   BMI 33.16 kg/m   General: NAD, pleasant, able to participate in exam HEENT: Left ear with cerumen impaction.  Unable to visualize TM.  Otherwise canal and external ear normal and nontender.  No periauricular swelling or abscess noted.  No sinus tenderness.  Right TM, canal, external ear normal.  L ear visualized again after irrigation with no remaining cerumen, TM visualized w/ no bulging or erythema, canal and external ear normal.  Respiratory: No respiratory distress Skin: warm and dry, no rashes noted Psych: Normal affect and mood  ASSESSMENT/PLAN:   Assessment & Plan Impacted cerumen of left ear Irrigation performed in office today with success and removal of cerumen impaction.  Advised trial of OTC Debrox drops if symptoms return.  Provided return precautions for pain, infection, headaches/dizziness.  Follow-up as needed   Vonna Drafts, MD [facility]

## 2023-06-18 NOTE — Patient Instructions (Addendum)
You can try using Debrox drops with an irrigation syringe

## 2023-07-03 ENCOUNTER — Other Ambulatory Visit: Payer: Self-pay | Admitting: Family Medicine

## 2023-07-03 DIAGNOSIS — J4540 Moderate persistent asthma, uncomplicated: Secondary | ICD-10-CM

## 2023-07-09 ENCOUNTER — Telehealth: Payer: Self-pay

## 2023-07-09 NOTE — Telephone Encounter (Signed)
Patient calls nurse line in regards to Advair.   She reports she has been using this medication for a few weeks now in place of Symbicort, however she reports insufficient symptom management.  She reports her insurance changed and they will not cover Symbicort without a PA. She reports she was willing to try the Advair, however she reports it does not work as well and she has been relying on her Albuterol inhaler more.  Will ask PCP to send in Symbicort and will attempt a prior authorization.

## 2023-07-10 ENCOUNTER — Other Ambulatory Visit: Payer: Self-pay | Admitting: Family Medicine

## 2023-07-10 MED ORDER — BUDESONIDE-FORMOTEROL FUMARATE 160-4.5 MCG/ACT IN AERO: 2.0000 | INHALATION_SPRAY | Freq: Two times a day (BID) | RESPIRATORY_TRACT | 0 refills | Status: DC

## 2023-07-10 NOTE — Progress Notes (Signed)
Refilled patient's former Symbicort prescription. Patient was recently trying Advair due to cost of Symbicort. Patient does not find Advair as helpful and would like to return to Symbicort as before.

## 2023-07-15 ENCOUNTER — Other Ambulatory Visit (HOSPITAL_COMMUNITY): Payer: Self-pay

## 2023-07-19 NOTE — Telephone Encounter (Signed)
Pharmacy Patient Advocate Encounter  Received notification from [facility] [employer] that Prior Authorization for [facility] has been DENIED.  Full denial letter will be uploaded to the media tab. See denial reason below.  Your plan only covers this product when you meet all of the following:  A) You have tried a different version of this product (preferred products),  B) You had a bad side effect, and  C) The bad side effect was not expected from the active ingredient of this product.   For your plan, you may need to try up to three preferred products. We have denied your request because you do not meet these conditions. We reviewed the information we had. Your request has been denied. Your doctor can send [place] any new or missing information for [place] to review. The preferred products for your plan are: budesonide-formoterol, fluticasone-salmeterol diskus, Breo Ellipta. Your doctor may need to get approval from your plan for preferred products. For this drug, you may have to meet other criteria. You can request the drug policy for more details. You can also request other plan documents for your review.  PA #/Case ID/Reference #: [id number]

## 2023-07-30 ENCOUNTER — Other Ambulatory Visit: Payer: Self-pay

## 2023-07-31 ENCOUNTER — Telehealth: Payer: Self-pay | Admitting: Family Medicine

## 2023-07-31 ENCOUNTER — Encounter: Payer: Self-pay | Admitting: Family Medicine

## 2023-07-31 MED ORDER — BUDESONIDE-FORMOTEROL FUMARATE 160-4.5 MCG/ACT IN AERO: 2.0000 | INHALATION_SPRAY | Freq: Two times a day (BID) | RESPIRATORY_TRACT | 2 refills | Status: DC

## 2023-07-31 NOTE — Telephone Encounter (Signed)
Attempted to call patient regarding a fax from her pharmacy. Was not able to reach, left message informing patient that I would message her on MyChart.   The [facility] (from [facility]) contained a recommendation to review possible duplicate medications including: Budesonide/Formoterol and Wixela. From my chart review, I do not see a prescription for Wixela. I will message the patient and ask her to clarify.

## 2023-08-02 ENCOUNTER — Ambulatory Visit (INDEPENDENT_AMBULATORY_CARE_PROVIDER_SITE_OTHER): Payer: 59 | Admitting: Family Medicine

## 2023-08-02 VITALS — BP 130/90 | HR 98 | Ht 66.0 in | Wt 198.2 lb

## 2023-08-02 DIAGNOSIS — J069 Acute upper respiratory infection, unspecified: Secondary | ICD-10-CM | POA: Diagnosis not present

## 2023-08-02 NOTE — Progress Notes (Signed)
    SUBJECTIVE:   CHIEF COMPLAINT / HPI:   Sick symptoms Has had cough with yellow phlegm, pressure in the eyes, and sore throat that started earlier this week.  Has been taking over-the-counter cough medicines.  Now, her sinus pressure is improved.  She still has a sore throat and is still coughing up some phlegm.  Denies chest pain, shortness of breath, fevers.  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  PERTINENT  PMH / PSH: Asthma  OBJECTIVE:   BP (!) 130/90   Pulse 98   Ht 5\' 6"  (1.676 m)   Wt 198 lb 3.2 oz (89.9 kg)   LMP 07/20/2023   SpO2 98%   BMI 31.99 kg/m   General: Alert and oriented, in NAD Skin: Warm, dry, and intact without lesions HEENT: NCAT, EOM grossly normal, midline nasal septum Cardiac: RRR, no m/r/g appreciated Respiratory: CTAB, breathing and speaking comfortably on RA Abdominal: Soft, nontender, nondistended, normoactive bowel sounds Extremities: Moves all extremities grossly equally Neurological: No gross focal deficit Psychiatric: Appropriate mood and affect   ASSESSMENT/PLAN:   Viral URI History and exam most concerning for viral process.  Reassuringly no focal findings on exam.  Recommended conservative management with continued OTC meds including Tylenol/ibuprofen as well as honey and warm tea.  Discussed returning to care should she have fever, chest pain, increasing shortness of breath, or otherwise is not improving.   Health maintenance Consider COVID vaccination with PCP when not ill.  Janeal Holmes, MD [facility]

## 2023-08-02 NOTE — Patient Instructions (Signed)
You have a viral infection.  Continue to use honey with tea, Tylenol, ibuprofen.  If the symptoms do not improve or get worse especially for shortness of breath, chest pain, fever let me know.

## 2023-09-25 ENCOUNTER — Other Ambulatory Visit: Payer: Self-pay | Admitting: Family Medicine

## 2023-09-25 DIAGNOSIS — J4540 Moderate persistent asthma, uncomplicated: Secondary | ICD-10-CM

## 2023-09-27 NOTE — Telephone Encounter (Signed)
Chart reviewed. Rx refilled.

## 2023-10-07 ENCOUNTER — Telehealth: Payer: Self-pay

## 2023-10-07 NOTE — Telephone Encounter (Signed)
Received call from [place] at [facility] regarding PA denial on Symbicort.   Patient is requesting that our office submit an appeal.   Rolly Salter advised that verbal appeal could be made by provider calling [phone].  We could also submit documentation for need of medication/letter of medical necessity to fax number [phone].  Will forward to PCP.   Veronda Prude, RN

## 2023-10-10 ENCOUNTER — Encounter: Payer: Self-pay | Admitting: Family Medicine

## 2023-10-17 ENCOUNTER — Encounter: Payer: Self-pay | Admitting: Family Medicine

## 2023-10-24 ENCOUNTER — Ambulatory Visit (INDEPENDENT_AMBULATORY_CARE_PROVIDER_SITE_OTHER): Payer: 59 | Admitting: Family Medicine

## 2023-10-24 ENCOUNTER — Encounter: Payer: Self-pay | Admitting: Family Medicine

## 2023-10-24 VITALS — BP 137/94 | HR 98 | Ht 65.0 in | Wt 202.0 lb

## 2023-10-24 DIAGNOSIS — J4540 Moderate persistent asthma, uncomplicated: Secondary | ICD-10-CM | POA: Diagnosis not present

## 2023-10-24 MED ORDER — FLUTICASONE FUROATE-VILANTEROL 100-25 MCG/ACT IN AEPB: 1.0000 | INHALATION_SPRAY | Freq: Every day | RESPIRATORY_TRACT | 5 refills | Status: DC

## 2023-10-24 NOTE — Patient Instructions (Addendum)
Thank you for visiting clinic today - it is always our pleasure to care for you.  Today we discussed your asthma and inhalers. I will continue reaching out to folks about your insurance coverage of Symbicort. For now, I have sent a prescription for [facility] for you to see if it will be covered.   Please schedule an appointment for your yearly well visit.   Reach out any time with any questions or concerns you may have - we are here for you!  Taylor Quale, MD [facility] [phone]

## 2023-10-24 NOTE — Assessment & Plan Note (Signed)
Last LFT in 2019 with mild obstructive lung disease, suggesting asthma with component of COPD.  Unfortunately asthma symptoms not well-controlled by preferred alternatives.  Previous efforts to authorize Symbicort have been unsuccessful. -Breo Ellipta ordered -Will continue to work with [employer] as needed if [employer] is not covered -Discussed pneumococcal vaccine, patient to consider at next appointment

## 2023-10-24 NOTE — Progress Notes (Signed)
    SUBJECTIVE:   CHIEF COMPLAINT / HPI:   Asthma  Patient presenting today for asthma follow-up.  Recently insurance has denied coverage for her long-term medication Symbicort.  Patient has tried the preferred alternatives including Wixela and Advair, which did not provide adequate relief or management of her symptoms.  When taking Wixela or Advair, patient with continued dyspnea and chest tightness.  Only Symbicort provides appropriate relief and management.  Has recently been using albuterol daily, particularly before exercising.  Was using DuoNebs when she did not have Symbicort available.  Distant history of Breo Ellipta use, which was then switched to Symbicort due to insurance.  Notes that [employer] worked as well as Symbicort does.  Quit smoking tobacco several months ago.  PERTINENT  PMH / PSH: Asthma  OBJECTIVE:   BP (!) 137/94   Pulse 98   Ht 5\' 5"  (1.651 m)   Wt 202 lb (91.6 kg)   SpO2 99%   BMI 33.61 kg/m   General: Well-appearing. Resting comfortably in room. CV: Normal S1/S2. No extra heart sounds. Warm and well-perfused. Pulm: Breathing comfortably on room air.  Scattered end inspiratory wheezing.  Appropriate air movement throughout.  No increased WOB. Skin:  Warm, dry.  Psych: Pleasant and appropriate.    ASSESSMENT/PLAN:   Assessment & Plan Moderate persistent asthma without complication Last LFT in 2019 with mild obstructive lung disease, suggesting asthma with component of COPD.  Unfortunately asthma symptoms not well-controlled by preferred alternatives.  Previous efforts to authorize Symbicort have been unsuccessful. -Breo Ellipta ordered -Will continue to work with [employer] as needed if [employer] is not covered -Discussed pneumococcal vaccine, patient to consider at next appointment  Return to clinic when convenient for annual exam.   Ivery Quale, MD [facility]

## 2023-12-14 ENCOUNTER — Other Ambulatory Visit: Payer: Self-pay | Admitting: Family Medicine

## 2023-12-14 DIAGNOSIS — J4540 Moderate persistent asthma, uncomplicated: Secondary | ICD-10-CM

## 2023-12-17 NOTE — Telephone Encounter (Signed)
 Chart reviewed. Rx refilled.

## 2023-12-20 ENCOUNTER — Other Ambulatory Visit: Payer: Self-pay | Admitting: Family Medicine

## 2023-12-20 DIAGNOSIS — Z1231 Encounter for screening mammogram for malignant neoplasm of breast: Secondary | ICD-10-CM

## 2024-03-30 ENCOUNTER — Ambulatory Visit
Admission: RE | Admit: 2024-03-30 | Discharge: 2024-03-30 | Disposition: A | Discharge status: Home / Self Care | Source: Ambulatory Visit | Attending: Family Medicine | Admitting: Family Medicine

## 2024-03-30 DIAGNOSIS — Z1231 Encounter for screening mammogram for malignant neoplasm of breast: Secondary | ICD-10-CM | POA: Diagnosis not present

## 2024-04-01 ENCOUNTER — Ambulatory Visit: Payer: Self-pay | Admitting: Family Medicine

## 2024-04-03 ENCOUNTER — Encounter: Payer: Self-pay | Admitting: Family Medicine

## 2024-04-03 ENCOUNTER — Ambulatory Visit: Payer: Self-pay | Admitting: Family Medicine

## 2024-04-03 ENCOUNTER — Ambulatory Visit (INDEPENDENT_AMBULATORY_CARE_PROVIDER_SITE_OTHER): Admitting: Family Medicine

## 2024-04-03 VITALS — BP 151/98 | HR 93 | Ht 65.0 in | Wt 200.2 lb

## 2024-04-03 DIAGNOSIS — R030 Elevated blood-pressure reading, without diagnosis of hypertension: Secondary | ICD-10-CM | POA: Diagnosis not present

## 2024-04-03 DIAGNOSIS — M545 Low back pain, unspecified: Secondary | ICD-10-CM | POA: Diagnosis not present

## 2024-04-03 DIAGNOSIS — E7800 Pure hypercholesterolemia, unspecified: Secondary | ICD-10-CM | POA: Diagnosis not present

## 2024-04-03 DIAGNOSIS — Z0000 Encounter for general adult medical examination without abnormal findings: Secondary | ICD-10-CM | POA: Diagnosis not present

## 2024-04-03 LAB — POCT GLYCOSYLATED HEMOGLOBIN (HGB A1C): Hemoglobin A1C: 5.3 % (ref 4.0–5.6)

## 2024-04-03 MED ORDER — LIDOCAINE 5 % EX PTCH
1.0000 | MEDICATED_PATCH | CUTANEOUS | 0 refills | Status: DC
Start: 2024-04-03 — End: 2024-04-14

## 2024-04-03 NOTE — Patient Instructions (Signed)
 Thank you for visiting clinic today and allowing us  to participate in your care!  For your back pain, please try taking Tylenol  and using lidocaine patches.   You are scheduled to meet with our clinical pharmacist, Dr Koval, on Monday to set up at home blood pressure monitoring.   We are checking some labwork for you today and I will contact you with the results. We have also placed a routine colonoscopy referral for you today - someone will contact you to schedule this.   Please schedule an appointment in 4 weeks to follow up.   Reach out any time with any questions or concerns you may have - we are here for you!  Taylor Cassis, MD [facility] [phone]

## 2024-04-03 NOTE — Progress Notes (Cosign Needed Addendum)
    SUBJECTIVE:   CHIEF COMPLAINT / HPI:   Low back pain  -[place] a month ago -Fell from step stool -Landed on bottom/lower back -Has been sore since -Worse this week, especially L lower back   -Worse with moving/sitting/standing  -Walking without issue   PERTINENT  PMH / PSH: Elevated BP, Elevated cholesterol  OBJECTIVE:   BP (!) 151/98   Pulse 93   Ht 5' 5 (1.651 m)   Wt 200 lb 3.2 oz (90.8 kg)   LMP 03/24/2024 (Exact Date)   SpO2 99%   BMI 33.32 kg/m   General: Well-appearing. Resting comfortably in room. MSK: No spinal bony tenderness. Tender to palpation of R lower back soft tissue. Ambulating independently without issue.  Skin:  Warm, dry. Psych: Pleasant and appropriate.    ASSESSMENT/PLAN:   Assessment & Plan Low back pain, non-specific Exam most consistent with soft tissue etiology.  - Discussed tylenol , lidocaine  patches as needed - Rest and supportive care  Elevated blood pressure reading Elevated BP on recheck today, 151/98. Hx elevated BP in recent visits to 130s/90s. - Discussed and patient prefers to not be on BP meds if possible - Discussed ambulatory 24 hr monitoring - patient interested  - Scheduled patient to see Dr Koval on Monday for BP monitoring   Elevated cholesterol Patient would like cholesterol checked today. Hx mildly elevated cholesterol, LDL 119 with total 208 a year ago.  - Lipid panel  Healthcare maintenance Patient feels open to routine colonoscopy at this time.  - Colonoscopy referral placed   RTC in 4 weeks.   Damien Cassis, MD [facility]

## 2024-04-04 LAB — BASIC METABOLIC PANEL WITH GFR
BUN/Creatinine Ratio: 11 (ref 9–23)
BUN: 9 mg/dL (ref 6–24)
CO2: 23 mmol/L (ref 20–29)
Calcium: 9.3 mg/dL (ref 8.7–10.2)
Chloride: 105 mmol/L (ref 96–106)
Creatinine, Ser: 0.8 mg/dL (ref 0.57–1.00)
Glucose: 81 mg/dL (ref 70–99)
Potassium: 3.9 mmol/L (ref 3.5–5.2)
Sodium: 141 mmol/L (ref 134–144)
eGFR: 91 mL/min/1.73 (ref >59)

## 2024-04-04 LAB — LIPID PANEL
Chol/HDL Ratio: 3.7 ratio (ref 0.0–4.4)
Cholesterol, Total: 194 mg/dL (ref 100–199)
HDL: 53 mg/dL (ref >39)
LDL Chol Calc (NIH): 120 mg/dL — ABNORMAL HIGH (ref 0–99)
Triglycerides: 120 mg/dL (ref 0–149)
VLDL Cholesterol Cal: 21 mg/dL (ref 5–40)

## 2024-04-05 NOTE — Assessment & Plan Note (Signed)
 Elevated BP on recheck today, 151/98. Hx elevated BP in recent visits to 130s/90s. - Discussed and patient prefers to not be on BP meds if possible - Discussed ambulatory 24 hr monitoring - patient interested  - Scheduled patient to see Dr Koval on Monday for BP monitoring

## 2024-04-06 ENCOUNTER — Ambulatory Visit: Admitting: Pharmacist

## 2024-04-07 ENCOUNTER — Telehealth: Payer: Self-pay

## 2024-04-07 NOTE — Telephone Encounter (Signed)
 Pharmacy Patient Advocate Encounter   Received notification from Cover[employer]eds that prior authorization for LIDOCAINE  5% PATCHES is required/requested.   Insurance verification completed.   The patient is insured through [facility] .   PA required; PA submitted to above mentioned insurance via CoverMyMeds Key/confirmation #/EOC [id number]. Status is pending

## 2024-04-08 NOTE — Telephone Encounter (Signed)
 Pharmacy Patient Advocate Encounter  Received notification from [facility] that Prior Authorization for lidocaine  5% patches has been DENIED.  Full denial letter will be uploaded to the media tab. See denial reason below.  Your plan only covers this drug when it is used for certain health conditions. Covered uses are:  A) pain associated with post-herpetic neuralgia,  B) pain associated with diabetic neuropathy,  C) pain associated with cancer-related neuropathy (including treatment-related neuropathy).  Your plan does not cover this drug for your health condition that your doctor told us  you have.  LIDOCAINE  4% PATCHES AVAILABLE OTC  PA #/Case ID/Reference #: [id number]

## 2024-04-12 ENCOUNTER — Other Ambulatory Visit: Payer: Self-pay | Admitting: Family Medicine

## 2024-04-12 DIAGNOSIS — J301 Allergic rhinitis due to pollen: Secondary | ICD-10-CM

## 2024-04-13 NOTE — Telephone Encounter (Signed)
 Chart reviewed. Rx refilled.

## 2024-04-14 ENCOUNTER — Encounter: Payer: Self-pay | Admitting: Pharmacist

## 2024-04-14 ENCOUNTER — Ambulatory Visit (INDEPENDENT_AMBULATORY_CARE_PROVIDER_SITE_OTHER): Admitting: Pharmacist

## 2024-04-14 VITALS — BP 153/98 | Wt 201.0 lb

## 2024-04-14 DIAGNOSIS — R030 Elevated blood-pressure reading, without diagnosis of hypertension: Secondary | ICD-10-CM

## 2024-04-14 NOTE — Progress Notes (Signed)
 Reviewed and agree with Dr Macky Lower plan.

## 2024-04-14 NOTE — Progress Notes (Signed)
 S:     Chief Complaint  Patient presents with   Medication Management    Ambulatory bp monitor day 1   48 y.o. female who presents for hypertension evaluation, education, and management. Patient arrives in good spirits and presents without any assistance.   Patient was referred and last seen by Primary Care Provider, Dr. Diona, on 04/03/24.  At last visit, pressure was elevated in office, pt was recommended for ambulatory bp monitor.   PMH is significant for Asthma, Hyperlipidemia, and Venous insufficiency. [facility] of hypertension (maternal).     Medication compliance is reported to be good.  Discussed procedure for wearing the monitor and gave patient written instructions. Monitor was placed on non-dominant arm with instructions to return in the morning.   Current BP Medications include:  N/A  Antihypertensives tried in the past include: N/A  Dietary habits include: reports using seasoning salt but rare use of table salt and pepper Pt reports home monitoring with arm cuff readings: 134-152/88-91 O:  Review of Systems  All other systems reviewed and are negative.   Physical Exam Vitals reviewed.  Constitutional:      Appearance: Normal appearance.  Pulmonary:     Effort: Pulmonary effort is normal.  Neurological:     Mental Status: She is alert.  Psychiatric:        Mood and Affect: Mood normal.        Behavior: Behavior normal.        Thought Content: Thought content normal.        Judgment: Judgment normal.     Last 3 Office BP readings: BP Readings from Last 3 Encounters:  04/14/24 (!) 153/98  04/03/24 (!) 151/98  10/24/23 (!) 137/94    Clinical Atherosclerotic Cardiovascular Disease (ASCVD): No  The 10-year ASCVD risk score (Arnett DK, et al., 2019) is: 7.2%   Values used to calculate the score:     Age: 29 years     Clincally relevant sex: Female     Is Non-Hispanic African American: Yes     Diabetic: No     Tobacco smoker: Yes     Systolic Blood  Pressure: 153 mmHg     Is BP treated: No     HDL Cholesterol: 53 mg/dL     Total Cholesterol: 194 mg/dL  Basic Metabolic Panel    Component Value Date/Time   NA 141 04/03/2024 1453   K 3.9 04/03/2024 1453   CL 105 04/03/2024 1453   CO2 23 04/03/2024 1453   GLUCOSE 81 04/03/2024 1453   GLUCOSE 91 03/01/2021 2015   BUN 9 04/03/2024 1453   CREATININE 0.80 04/03/2024 1453   CALCIUM 9.3 04/03/2024 1453   GFRNONAA >60 03/01/2021 2015   GFRAA 127 05/13/2019 1044    Renal function: Estimated Creatinine Clearance: 94.9 mL/min (by C-G formula based on SCr of 0.8 mg/dL).   ABPM Study Data: Arm Placement right arm  For Office Goal BP of <130/80 mmHg:  ABPM thresholds: Overall BP <125/75 mmHg, daytime BP <130/80 mmHg, sleeptime BP <110/65 mmHg     A/P: History of elevated blood pressure in office since 02/11/23; with goal presssure of <130/80 mm Hg.     -Placed blood pressure cuff, provided education, patient instructed to wear cuff for 24 hours and return tomorrow to review results.   Written patient instructions provided including activity/symptom/event log. Patient verbalized understanding of plan. Total time in face to face counseling 15 minutes.    Follow-up: Tomorrow AM - early morning  appointment 9 am  Patient seen with Fonda Blase, PharmD Candidate - PY3 student and Calton Nash, PharmD Candidate - PY4 student.

## 2024-04-14 NOTE — Patient Instructions (Signed)
 Blood Pressure Activity Diary Time Lying down/ Sleeping Walking/ Exercise Stressed/ Angry Headache/ Pain Dizzy  9 AM       10 AM       11 AM       12 PM       1 PM       2 PM       Time Lying down/ Sleeping Walking/ Exercise Stressed/ Angry Headache/ Pain Dizzy  3 PM       4 PM        5 PM       6 PM       7 PM       8 PM       Time Lying down/ Sleeping Walking/ Exercise Stressed/ Angry Headache/ Pain Dizzy  9 PM       10 PM       11 PM       12 AM       1 AM       2 AM       3 AM       Time Lying down/ Sleeping Walking/ Exercise Stressed/ Angry Headache/ Pain Dizzy  4 AM       5 AM       6 AM       7 AM       8 AM       9 AM       10 AM        Time you woke up: _________                  Time you went to sleep:__________  Come back tomorrow at 9:00AM to have the monitor removed  Call the [facility] if you have any questions before then [phone])  Wearing the Blood Pressure Monitor The cuff will inflate every 20 minutes during the day and every 30 minutes while you sleep. Fill out the blood pressure-activity diary during the day, especially during activities that may affect your reading -- such as exercise, stress, walking, taking your blood pressure medications  Important things to know: Avoid taking the monitor off for the next 24 hours, unless it causes you discomfort or pain. Do NOT get the monitor wet and do NOT try to clean the monitor with any cleaning products. Do NOT put the monitor on anyone else's arm. When the cuff inflates, avoid excess movement. Let the cuffed arm hang loosely, slightly away from the body. Avoid flexing the muscles or moving the hand/fingers. Remember to fill out the blood pressure activity diary. If you experience severe pain or unusual pain (not associated with getting your blood pressure checked), remove the monitor.  Troubleshooting:  Code  Troubleshooting   1  Check cuff position, tighten cuff   2, 3  Remain  still during reading   4, 87  Check air hose connections and make sure cuff is tight   85, 89  Check hose connections and make tubing is not crimped   86  Push START/STOP to restart reading   88, 91  Retry by pushing START/STOP   90  Replace batteries. If problem persists, remove monitor and bring back to   clinic at follow up   97, 98, 99  Service required - Remove monitor and bring back to clinic at follow up

## 2024-04-14 NOTE — Assessment & Plan Note (Signed)
 History of elevated blood pressure in office since 02/11/23; with goal presssure of <130/80 mm Hg.     -Placed blood pressure cuff, provided education, patient instructed to wear cuff for 24 hours and return tomorrow to review results.

## 2024-04-15 ENCOUNTER — Encounter: Payer: Self-pay | Admitting: Pharmacist

## 2024-04-15 ENCOUNTER — Ambulatory Visit: Admitting: Pharmacist

## 2024-04-15 VITALS — BP 141/86 | Wt 201.0 lb

## 2024-04-15 DIAGNOSIS — R030 Elevated blood-pressure reading, without diagnosis of hypertension: Secondary | ICD-10-CM | POA: Diagnosis not present

## 2024-04-15 MED ORDER — OLMESARTAN MEDOXOMIL 20 MG PO TABS: 20.0000 mg | ORAL_TABLET | Freq: Every day | ORAL | 1 refills | Status: DC

## 2024-04-15 NOTE — Patient Instructions (Signed)
 It was nice to see you today!  Thank you for completing the blood pressure monitoring evaluation.  Your goal blood pressure is < 130/80 mmHg   Medication Changes: START olmesartan  20 mg 1 tab by mouth daily   Continue all other medication the same.   Monitor blood pressure at home and keep a log (on a piece of paper) to bring with you to your next visit. Check at least 3-5 times between starting medication and your next visit on 04/27/24.  Please report any tongue or lip swelling or breathing difficulty.

## 2024-04-15 NOTE — Assessment & Plan Note (Signed)
 History of elevated blood pressure completed 24-hour ambulatory blood pressure study. Avg awake period Blood Pressure elevated at 141/86 mmHg indicating need for therapeutic intervention to reach goal of <130/80 mmHg. First-line options include ACEi/ARB, thiazides, or CCBs. Family history of CHD and hypertension, patient denies family history of Chronic Kidney Disease. ARB preferred over ACEi to reduce risk of angioedema. Olmesartan  preferred over other ARBs due to minimal comorbidities and ability to increase to triple therapy formulation if needed in future. Found to have persistently elevated and uncontrolled blood pressure with 24-hour ambulatory blood pressure evaluation which demonstrates an average AWAKE blood pressure of 141/86 mmHg. Nocturnal dipping pattern is normal.   Changes to medications -Start olmesartan  20 mg 1 tab daily at bedtime. -Patient informed of potential adverse effects (tongue/lip swelling, difficulty breathing) and advised to report immediately if occurs. -Continue all other medications as prescribed. -Plan to check home Blood Pressure 3-5 times between now and next visit.

## 2024-04-15 NOTE — Progress Notes (Signed)
 S:     Chief Complaint  Patient presents with   Medication Management    Day 2 Amb Blood Pressure    48 y.o. female who presents for hypertension evaluation, education, and management.  Patient arrives in good spirits and presents without any assistance.   Patient returns to clinic with 24 hour blood pressure monitor and reports they were able to wear the ambulatory blood pressure cuff for the entire 24 evaluation period.   Patient reports noticing high Blood Pressure around 11 am, and 6-7 pm. Reports eating dinner ([employer]) around 8-9 pm. HR spiked around 9:30 pm, although Blood Pressure did not spike - Patient reports smoking weed at this time and then getting into bed.  Family History: Denies hx of kidney disease. Reports hx of heart failure. Patient unaware of antihypertensive her mother takes.  Patient denies swelling late in the day, patient reports she never swells.   O:  Review of Systems  All other systems reviewed and are negative.   Physical Exam Constitutional:      Appearance: Normal appearance.  Pulmonary:     Effort: Pulmonary effort is normal.  Neurological:     Mental Status: She is alert.  Psychiatric:        Mood and Affect: Mood normal.        Behavior: Behavior normal.        Thought Content: Thought content normal.        Judgment: Judgment normal.     Last 3 Office BP readings: BP Readings from Last 3 Encounters:  04/14/24 (!) 153/98  04/03/24 (!) 151/98  10/24/23 (!) 137/94    Clinical Atherosclerotic Cardiovascular Disease (ASCVD): No  The 10-year ASCVD risk score (Arnett DK, et al., 2019) is: 7.2%   Values used to calculate the score:     Age: 27 years     Clincally relevant sex: Female     Is Non-Hispanic African American: Yes     Diabetic: No     Tobacco smoker: Yes     Systolic Blood Pressure: 153 mmHg     Is BP treated: No     HDL Cholesterol: 53 mg/dL     Total Cholesterol: 194 mg/dL  Basic Metabolic Panel    Component  Value Date/Time   NA 141 04/03/2024 1453   K 3.9 04/03/2024 1453   CL 105 04/03/2024 1453   CO2 23 04/03/2024 1453   GLUCOSE 81 04/03/2024 1453   GLUCOSE 91 03/01/2021 2015   BUN 9 04/03/2024 1453   CREATININE 0.80 04/03/2024 1453   CALCIUM 9.3 04/03/2024 1453   GFRNONAA >60 03/01/2021 2015   GFRAA 127 05/13/2019 1044    Renal function: Estimated Creatinine Clearance: 94.9 mL/min (by C-G formula based on SCr of 0.8 mg/dL).   ABPM Study Data: Arm Placement right arm  Overall Mean 24hr BP:   131/79 mmHg  HR: 69  Daytime Mean BP:  141/86 mmHg  HR: 68  Nighttime Mean BP:  113/65 mmHg  HR: 70  Dipping Pattern: Yes.    Sys:   19.7%   Dia: 23.9%  [normal dipping ~10-20%]   For Office Goal BP of <130/80 mmHg:  ABPM thresholds: Overall BP <125/75 mmHg, daytime BP <130/80 mmHg, sleeptime BP <110/65 mmHg    A/P: History of elevated blood pressure completed 24-hour ambulatory blood pressure study. Avg awake period Blood Pressure elevated at 141/86 mmHg indicating need for therapeutic intervention to reach goal of <130/80 mmHg. First-line options include ACEi/ARB,  thiazides, or CCBs. Family history of CHD and hypertension, patient denies family history of Chronic Kidney Disease. ARB preferred over ACEi to reduce risk of angioedema. Olmesartan  preferred over other ARBs due to minimal comorbidities and ability to increase to triple therapy formulation if needed in future. Found to have persistently elevated and uncontrolled blood pressure with 24-hour ambulatory blood pressure evaluation which demonstrates an average AWAKE blood pressure of 141/86 mmHg. Nocturnal dipping pattern is normal.   Changes to medications -Start olmesartan  20 mg 1 tab daily at bedtime. -Patient informed of potential adverse effects (tongue/lip swelling, difficulty breathing) and advised to report immediately if occurs. -Continue all other medications as prescribed. -Plan to check home Blood Pressure 3-5 times between  now and next visit.    Results reviewed and written information provided.    Written patient instructions provided. Patient verbalized understanding of treatment plan.  Total time in face to face counseling 26 minutes.    Follow-up:  Pharmacist 04/27/24 PCP clinic visit in TBD Patient seen with Fonda Blase, PharmD Candidate - PY3 student and Calton Nash, PharmD Candidate - PY4 student.

## 2024-04-16 NOTE — Progress Notes (Signed)
 Reviewed and agree with Dr Macky Lower plan.

## 2024-04-17 ENCOUNTER — Encounter: Payer: Self-pay | Admitting: Family Medicine

## 2024-04-27 ENCOUNTER — Ambulatory Visit (INDEPENDENT_AMBULATORY_CARE_PROVIDER_SITE_OTHER): Admitting: Pharmacist

## 2024-04-27 ENCOUNTER — Encounter: Payer: Self-pay | Admitting: Pharmacist

## 2024-04-27 VITALS — BP 139/90 | HR 78 | Wt 201.0 lb

## 2024-04-27 DIAGNOSIS — R21 Rash and other nonspecific skin eruption: Secondary | ICD-10-CM

## 2024-04-27 DIAGNOSIS — I159 Secondary hypertension, unspecified: Secondary | ICD-10-CM

## 2024-04-27 DIAGNOSIS — R030 Elevated blood-pressure reading, without diagnosis of hypertension: Secondary | ICD-10-CM

## 2024-04-27 MED ORDER — OLMESARTAN MEDOXOMIL-HCTZ 20-12.5 MG PO TABS: 1.0000 | ORAL_TABLET | Freq: Every day | ORAL | 2 refills | Status: DC

## 2024-04-27 MED ORDER — TRIAMCINOLONE ACETONIDE 0.1 % EX OINT: TOPICAL_OINTMENT | CUTANEOUS | 0 refills | Status: DC

## 2024-04-27 NOTE — Progress Notes (Signed)
 S:     Chief Complaint  Patient presents with   Medication Management    Bp f/u   48 y.o. female who presents for hypertension evaluation, education, and management.   Patient was referred and last seen by Primary Care Provider, Dr. Diona, on 04/03/24.  At last visit, started Olmesartan  20 mg daily 0n 04/15/24.   PMH is significant for asthma, Hyperlipidemia, and venous insufficiency.   Today, patient arrives in good spirits and presents without assistance. Denies dizziness, blurred vision, swelling. Reports headaches have lessened    Medication adherence is good. Patient reports taking blood pressure medications today.   Current antihypertensives include: olmesartan  20 mg  Reported home blood pressure readings: 116/84, 124/87, 150/98, 119/89, and diastolic up to 101 at one time.   Reports no cigarette or black and mild use since initiating Olmesartan  on 04/15/24. Has reported to still smoke weed when she craves it.  O Review of Systems  Neurological:  Positive for headaches.  All other systems reviewed and are negative.   Physical Exam Constitutional:      Appearance: Normal appearance.  Pulmonary:     Effort: Pulmonary effort is normal.  Neurological:     Mental Status: She is alert.  Psychiatric:        Mood and Affect: Mood normal.        Behavior: Behavior normal.        Thought Content: Thought content normal.        Judgment: Judgment normal.     Last 3 Office BP readings: BP Readings from Last 3 Encounters:  04/27/24 138/83  04/15/24 (!) 141/86  04/14/24 (!) 153/98    BMET    Component Value Date/Time   NA 141 04/03/2024 1453   K 3.9 04/03/2024 1453   CL 105 04/03/2024 1453   CO2 23 04/03/2024 1453   GLUCOSE 81 04/03/2024 1453   GLUCOSE 91 03/01/2021 2015   BUN 9 04/03/2024 1453   CREATININE 0.80 04/03/2024 1453   CALCIUM 9.3 04/03/2024 1453   GFRNONAA >60 03/01/2021 2015   GFRAA 127 05/13/2019 1044    Renal function: CrCl cannot be  calculated (Patient's most recent lab result is older than the maximum 21 days allowed.).  Clinical ASCVD: No  The 10-year ASCVD risk score (Arnett DK, et al., 2019) is: 4.8%   Values used to calculate the score:     Age: 79 years     Clincally relevant sex: Female     Is Non-Hispanic African American: Yes     Diabetic: No     Tobacco smoker: Yes     Systolic Blood Pressure: 138 mmHg     Is BP treated: No     HDL Cholesterol: 53 mg/dL     Total Cholesterol: 194 mg/dL     A/P: Hypertension diagnosed currently improving on current medications. BP goal < 130/80 mmHg. Medication adherence appears good. Control is suboptimal due to regimen.  -Discontinued Olmesartan  20 mg.  -Started Olmesartan /hydrochlorothiazide 20-12.5 mg daily.  -Patient educated on purpose, proper use, and potential adverse effects.  -F/u labs ordered - BMET (04/28/24) -Counseled on lifestyle modifications for blood pressure control including reduced dietary sodium, increased exercise, adequate sleep. -Encouraged patient to check BP at home and bring log of readings to next visit. Counseled on proper use of home BP cuff.   To discuss ways to quit smoking tobacco in the future.  Results reviewed and written information provided.    Written patient instructions provided. Patient  verbalized understanding of treatment plan.  Total time in face to face counseling 25 minutes.    Follow-up:  PCP clinic visit on 05/04/24.  Patient seen with Fonda Blase, PharmD Candidate - PY3 student and Calton Nash, PharmD Candidate - PY4 student.

## 2024-04-27 NOTE — Patient Instructions (Signed)
 It was nice to see you today! Happy to hear that you've been smoking less.  Your goal blood pressure is <130/80 mm Hg.  Medication Changes: - START olmesartan /hydrochlorothiazide 20-12.5 mg  - Discontinue Olmesartan  20 mg  - Continue all other medication the same.  Keep up the good work with diet and exercise. Aim for a diet full of vegetables, fruit and lean meats (chicken, [place], fish). Try to limit salt intake by eating fresh or frozen vegetables (instead of canned), rinse canned vegetables prior to cooking and do not add any additional salt to meals.   Monitor blood pressure at home daily and keep a log (on your phone or piece of paper) to bring with you to your next visit. Write down date, time, blood pressure and pulse.   Please bring all medications to your clinic visits.  Please arrive 10-15 minutes prior to your scheduled visit time.

## 2024-04-27 NOTE — Assessment & Plan Note (Signed)
 Hypertension diagnosed currently improving on current medications. BP goal < 130/80 mmHg. Medication adherence appears good. Control is suboptimal due to regimen.  -Discontinued Olmesartan  20 mg.  -Started Olmesartan /hydrochlorothiazide 20-12.5 mg daily.  -Patient educated on purpose, proper use, and potential adverse effects.  -F/u labs ordered - BMET (04/28/24) -Counseled on lifestyle modifications for blood pressure control including reduced dietary sodium, increased exercise, adequate sleep. -Encouraged patient to check BP at home and bring log of readings to next visit. Counseled on proper use of home BP cuff.

## 2024-04-27 NOTE — Progress Notes (Signed)
 Reviewed and agree with Dr Rennis plan.

## 2024-05-04 ENCOUNTER — Encounter: Admitting: Family Medicine

## 2024-05-26 ENCOUNTER — Encounter: Admitting: Family Medicine

## 2024-06-01 ENCOUNTER — Ambulatory Visit (AMBULATORY_SURGERY_CENTER)

## 2024-06-01 ENCOUNTER — Encounter: Payer: Self-pay | Admitting: Internal Medicine

## 2024-06-01 VITALS — Ht 66.0 in | Wt 191.0 lb

## 2024-06-01 DIAGNOSIS — Z1211 Encounter for screening for malignant neoplasm of colon: Secondary | ICD-10-CM

## 2024-06-01 MED ORDER — NA SULFATE-K SULFATE-MG SULF 17.5-3.13-1.6 GM/177ML PO SOLN: 1.0000 | Freq: Once | ORAL | 0 refills | Status: AC

## 2024-06-01 NOTE — Progress Notes (Unsigned)

## 2024-06-03 ENCOUNTER — Ambulatory Visit (INDEPENDENT_AMBULATORY_CARE_PROVIDER_SITE_OTHER): Admitting: Family Medicine

## 2024-06-03 ENCOUNTER — Encounter: Payer: Self-pay | Admitting: Family Medicine

## 2024-06-03 VITALS — BP 124/85 | HR 84 | Ht 66.0 in | Wt 195.2 lb

## 2024-06-03 DIAGNOSIS — R21 Rash and other nonspecific skin eruption: Secondary | ICD-10-CM | POA: Diagnosis not present

## 2024-06-03 DIAGNOSIS — Z139 Encounter for screening, unspecified: Secondary | ICD-10-CM

## 2024-06-03 DIAGNOSIS — Z23 Encounter for immunization: Secondary | ICD-10-CM | POA: Diagnosis not present

## 2024-06-03 DIAGNOSIS — I10 Essential (primary) hypertension: Secondary | ICD-10-CM

## 2024-06-03 MED ORDER — TRIAMCINOLONE ACETONIDE 0.1 % EX OINT: TOPICAL_OINTMENT | Freq: Two times a day (BID) | CUTANEOUS | 1 refills | Status: AC | PRN

## 2024-06-03 NOTE — Progress Notes (Signed)
    SUBJECTIVE:   Chief compliant/HPI: annual examination  Taylor Wilkinson is a 48 y.o. who presents today for an annual exam.   HTN  -Last took meds on Sunday, prefers without medications if possible  -Home readings recently are usually 120-30s/80s, one time lowest at 98/73 after waking up -Continues exercising and eating well  -Has quit smoking cigarettes for 1 month now and reports feeling much better with breathing and chest symptoms   OBJECTIVE:   BP 124/85   Pulse 84   Ht 5' 6 (1.676 m)   Wt 195 lb 3.2 oz (88.5 kg)   LMP 06/01/2024   SpO2 97%   BMI 31.51 kg/m   General: Well-appearing. Resting comfortably in room. CV: Normal S1/S2. No extra heart sounds. Warm and well-perfused. Pulm: Breathing comfortably on room air. CTAB. No increased WOB. Skin:  Warm, dry. No significant extremity swelling.  Psych: Pleasant and appropriate.   ASSESSMENT/PLAN:   Assessment & Plan Hypertension, unspecified type BP stable today, without medication since Sunday. Patient goal for blood pressure control without medication. Through SDM, patient will continue without medication for next couple of weeks, check BP daily, and plan for follow up soon. Encouraged continued healthy lifestyle modifications. Emergency precautions discussed.  Encounter for screening involving social determinants of health [facility]) Patient reports receiving notice that her health insurance was ending. Inquiring for support regarding new health insurance. [facility] referral placed.  Encounter for immunization Received annual flu vaccine and pneumococcal vaccine today.  Annual Examination  BP stable. See plan above.  PHQ score 0 today.  Considered the following items based upon USPSTF recommendations: Diabetes screening: recently completed and result reviewed, normal  HIV testing:NR in 2024 Hepatitis C: Negative 5 years ago Hepatitis B:Received HepB vaccine in 2024 Lipid panel recently completed and repeat not yet  indicated. Discussed possibility of starting statin given previous mild ascvd risk with HTN - patient prefers lifestyle modifications first.  Cervical cancer screening: HPV negative with normal pap 06/08/24 - due for repeat soon  Breast cancer screening: recently completed and repeat not yet indicated Colorectal cancer screening: has upcoming appointment for this   Follow up in 2 weeks for BP.  MyChart Activation: Already signed up  Damien Cassis, MD [facility] Medicine [facility]

## 2024-06-03 NOTE — Patient Instructions (Signed)
 Thank you for visiting clinic today and allowing us  to participate in your care!  Your blood pressure looks good today! Please continue to check your blood pressure every day and come back to see us  in clinic in 2 weeks.   Reach out any time with any questions or concerns you may have - we are here for you!  Damien Cassis, MD [facility] [phone]

## 2024-06-03 NOTE — Assessment & Plan Note (Signed)
 BP stable today, without medication since Sunday. Patient goal for blood pressure control without medication. Through SDM, patient will continue without medication for next couple of weeks, check BP daily, and plan for follow up soon. Encouraged continued healthy lifestyle modifications. Emergency precautions discussed.

## 2024-06-05 ENCOUNTER — Telehealth: Payer: Self-pay | Admitting: *Deleted

## 2024-06-05 NOTE — Progress Notes (Signed)
 Complex Care Management Note  Care Guide Note 06/05/2024 Name: Taylor Wilkinson MRN: [id number] DOB: 03-07-1976  Taylor Wilkinson is a 48 y.o. year old female who sees Diona Perkins, MD for primary care. I reached out to Shona CHRISTELLA Cola by phone today to offer complex care management services.  [name] was given information about Complex Care Management services today including:   The Complex Care Management services include support from the care team which includes your Nurse Care Manager, Clinical Social Worker, or Pharmacist.  The Complex Care Management team is here to help remove barriers to the health concerns and goals most important to you. Complex Care Management services are voluntary, and the patient may decline or stop services at any time by request to their care team member.   Complex Care Management Consent Status: Patient agreed to services and verbal consent obtained.   Follow up plan:  Telephone appointment with complex care management team member scheduled for:  10/1 with BSW and 10/7 with RNCM  Encounter Outcome:  Patient Scheduled  Harlene Satterfield  [facility]  [facility], [facility] Guide  Direct Dial: [phone]  Fax [phone]

## 2024-06-10 ENCOUNTER — Encounter: Payer: Self-pay | Admitting: Licensed Clinical Social Worker

## 2024-06-10 ENCOUNTER — Telehealth: Payer: Self-pay | Admitting: Licensed Clinical Social Worker

## 2024-06-10 NOTE — Patient Outreach (Signed)
 06/10/2024  Patient had an emergency, grandchild was hit by a car (hit and run) a few days ago and the the child was being released from the hospital and they needed to get the childe settled and situated. Rescheduled to 06/17/2024 at 10:45 am   Tobias CHARM [name] HEDWIG, PhD [facility], [facility] Social Worker Direct Dial: [phone]  Fax: [phone]

## 2024-06-15 ENCOUNTER — Ambulatory Visit: Admitting: Internal Medicine

## 2024-06-15 ENCOUNTER — Other Ambulatory Visit: Payer: Self-pay | Admitting: Family Medicine

## 2024-06-15 ENCOUNTER — Encounter: Payer: Self-pay | Admitting: Internal Medicine

## 2024-06-15 VITALS — BP 129/81 | HR 72 | Temp 97.5°F | Resp 17 | Ht 66.0 in | Wt 191.0 lb

## 2024-06-15 DIAGNOSIS — K5730 Diverticulosis of large intestine without perforation or abscess without bleeding: Secondary | ICD-10-CM

## 2024-06-15 DIAGNOSIS — K648 Other hemorrhoids: Secondary | ICD-10-CM

## 2024-06-15 DIAGNOSIS — D122 Benign neoplasm of ascending colon: Secondary | ICD-10-CM | POA: Diagnosis not present

## 2024-06-15 DIAGNOSIS — D123 Benign neoplasm of transverse colon: Secondary | ICD-10-CM

## 2024-06-15 DIAGNOSIS — I10 Essential (primary) hypertension: Secondary | ICD-10-CM | POA: Diagnosis not present

## 2024-06-15 DIAGNOSIS — K635 Polyp of colon: Secondary | ICD-10-CM | POA: Diagnosis not present

## 2024-06-15 DIAGNOSIS — R030 Elevated blood-pressure reading, without diagnosis of hypertension: Secondary | ICD-10-CM

## 2024-06-15 DIAGNOSIS — Z1211 Encounter for screening for malignant neoplasm of colon: Secondary | ICD-10-CM | POA: Diagnosis not present

## 2024-06-15 DIAGNOSIS — J45909 Unspecified asthma, uncomplicated: Secondary | ICD-10-CM | POA: Diagnosis not present

## 2024-06-15 MED ORDER — SODIUM CHLORIDE 0.9 % IV SOLN: 500.0000 mL | INTRAVENOUS | Status: DC

## 2024-06-15 NOTE — Progress Notes (Signed)
 HISTORY OF PRESENT ILLNESS:  Taylor Wilkinson is a 48 y.o. female who is sent directly for screening colonoscopy.  No complaints.  REVIEW OF SYSTEMS:  All non-GI ROS negative except for  Past Medical History:  Diagnosis Date   Asthma    Bronchitis    Hypertension    Miscarriage     Past Surgical History:  Procedure Laterality Date   LIGAMENT REPAIR Right    Foot   NO PAST SURGERIES      Social History Taylor Wilkinson  reports that she has quit smoking. Her smoking use included cigars and cigarettes. She has a 8 pack-year smoking history. She has been exposed to tobacco smoke. She has never used smokeless tobacco. She reports current alcohol use. She reports current drug use. Drug: Marijuana.  family history includes Asthma in her brother; Diabetes in her mother; Healthy in her father; Hypertension in her mother.  No Known Allergies     PHYSICAL EXAMINATION: Vital signs: BP 108/62   Pulse 85   Temp (!) 97.5 F (36.4 C) (Temporal)   Ht 5' 6 (1.676 m)   Wt 191 lb (86.6 kg)   LMP 06/01/2024   SpO2 100%   BMI 30.83 kg/m  General: Well-developed, well-nourished, no acute distress HEENT: Sclerae are anicteric, conjunctiva pink. Oral mucosa intact Lungs: Clear Heart: Regular Abdomen: soft, nontender, nondistended, no obvious ascites, no peritoneal signs, normal bowel sounds. No organomegaly. Extremities: No edema Psychiatric: alert and oriented x3. Cooperative     ASSESSMENT:   Colon cancer screening  PLAN:  Screening colonoscopy

## 2024-06-15 NOTE — Op Note (Signed)
 [facility] Patient Name: Taylor Wilkinson Procedure Date: 06/15/2024 11:21 AM MRN: [id number] Endoscopist: Norleen SAILOR. Abran , MD, [id number] Age: 48 Referring MD:  Date of Birth: [birth year] Gender: Female Account #: [id number] Procedure:                Colonoscopy with cold snare polypectomy x 2 Indications:              Screening for colorectal malignant neoplasm Medicines:                Monitored Anesthesia Care Procedure:                Pre-Anesthesia Assessment:                           - Prior to the procedure, a History and Physical                            was performed, and patient medications and                            allergies were reviewed. The patient's tolerance of                            previous anesthesia was also reviewed. The risks                            and benefits of the procedure and the sedation                            options and risks were discussed with the patient.                            All questions were answered, and informed consent                            was obtained. Prior Anticoagulants: The patient has                            taken no anticoagulant or antiplatelet agents. ASA                            Grade Assessment: II - A patient with mild systemic                            disease. After reviewing the risks and benefits,                            the patient was deemed in satisfactory condition to                            undergo the procedure.                           After obtaining informed consent, the colonoscope  was passed under direct vision. Throughout the                            procedure, the patient's blood pressure, pulse, and                            oxygen  saturations were monitored continuously. The                            CF [id number] #[id number] was introduced through the anus                            and advanced to the the cecum, identified by                             appendiceal orifice and ileocecal valve. The                            ileocecal valve, appendiceal orifice, and rectum                            were photographed. The quality of the bowel                            preparation was excellent. The colonoscopy was                            performed without difficulty. The patient tolerated                            the procedure well. The bowel preparation used was                            SUPREP via split dose instruction. Scope In: 11:41:41 AM Scope Out: 11:55:27 AM Scope Withdrawal Time: 0 hours 11 minutes 3 seconds  Total Procedure Duration: 0 hours 13 minutes 46 seconds  Findings:                 Two sessile polyps were found in the transverse                            colon and ascending colon. The polyps were 3 to 5                            mm in size. These polyps were removed with a cold                            snare. Resection and retrieval were complete.                           A few diverticula were found in the left colon and  right colon.                           Internal hemorrhoids were found during retroflexion.                           The exam was otherwise without abnormality on                            direct and retroflexion views. Complications:            No immediate complications. Estimated blood loss:                            None. Estimated Blood Loss:     Estimated blood loss: none. Impression:               - Two 3 to 5 mm polyps in the transverse colon and                            in the ascending colon, removed with a cold snare.                            Resected and retrieved.                           - Diverticulosis in the left colon and in the right                            colon.                           - Internal hemorrhoids.                           - The examination was otherwise normal on direct                            and  retroflexion views. Recommendation:           - Repeat colonoscopy in 7-10 years for surveillance.                           - Patient has a contact number available for                            emergencies. The signs and symptoms of potential                            delayed complications were discussed with the                            patient. Return to normal activities tomorrow.                            Written discharge instructions were provided to the  patient.                           - Resume previous diet.                           - Continue present medications.                           - Await pathology results. Norleen SAILOR. Abran, MD 06/15/2024 12:01:20 PM This report has been signed electronically.

## 2024-06-15 NOTE — Progress Notes (Signed)
 Called to room to assist during endoscopic procedure.  Patient ID and intended procedure confirmed with present staff. Received instructions for my participation in the procedure from the performing physician.

## 2024-06-15 NOTE — Progress Notes (Signed)
 Transferred to PACU via stretcher, arousing, VSS.

## 2024-06-15 NOTE — Progress Notes (Signed)
 Pt's states no medical or surgical changes since previsit or office visit.

## 2024-06-15 NOTE — Patient Instructions (Signed)

## 2024-06-16 ENCOUNTER — Other Ambulatory Visit: Payer: Self-pay | Admitting: *Deleted

## 2024-06-16 ENCOUNTER — Other Ambulatory Visit: Payer: Self-pay

## 2024-06-16 ENCOUNTER — Telehealth: Payer: Self-pay

## 2024-06-16 DIAGNOSIS — I10 Essential (primary) hypertension: Secondary | ICD-10-CM

## 2024-06-16 NOTE — Patient Instructions (Signed)
 Visit Information  Thank you for taking time to visit with me today. Please don't hesitate to contact me if I can be of assistance to you before our next scheduled appointment.  Your next care management appointment is by telephone on 07/07/24 at  11 am  Appointment with LCSW 07/06/24 @ 9 am.  Please call the care guide team at [phone] if you need to cancel, schedule, or reschedule an appointment.   Please call the Suicide and Crisis Lifeline: 988 call the [place]  National Suicide Prevention Lifeline: (669)657-1947 or TTY: 301-556-9758 TTY 205-259-8361) to talk to a trained counselor call 1-800-273-TALK (toll free, 24 hour hotline) go to [facility] [address], [place] [phone]) call 911 if you are experiencing a Mental Health or Behavioral Health Crisis or need someone to talk to.  Colleene Swarthout, RN, BSN, Theatre manager [employer] [phone]

## 2024-06-16 NOTE — Telephone Encounter (Signed)
  Follow up Call-     06/15/2024   10:23 AM  Call back number  Post procedure Call Back phone  # [phone]  Permission to leave phone message Yes     Patient questions:  Do you have a fever, pain , or abdominal swelling? No. Pain Score  0 *  Have you tolerated food without any problems? Yes.    Have you been able to return to your normal activities? Yes.    Do you have any questions about your discharge instructions: Diet   No. Medications  No. Follow up visit  No.  Do you have questions or concerns about your Care? No.  Actions: * If pain score is 4 or above: No action needed, pain <4.

## 2024-06-16 NOTE — Patient Outreach (Signed)
 Complex Care Management   Visit Note  06/16/2024  Name:  Taylor Wilkinson MRN: [id number] DOB: 1976-01-11  Situation: Referral received for Complex Care Management related to SDOH Barriers:  Depression and HTN I obtained verbal consent from Patient.  Visit completed with Patient  on the phone  Background:   Past Medical History:  Diagnosis Date   Asthma    Bronchitis    Hypertension    Miscarriage     Assessment: Patient Reported Symptoms:  Cognitive Cognitive Status: No symptoms reported, Able to follow simple commands, Alert and oriented to person, place, and time, Insightful and able to interpret abstract concepts, Normal speech and language skills Cognitive/Intellectual Conditions Management [RPT]: None reported or documented in medical history or problem list   Health Maintenance Behaviors: Annual physical exam, Healthy diet, Sleep adequate, Stress management Healing Pattern: Average  Neurological Neurological Review of Symptoms: No symptoms reported Neurological Management Strategies: Adequate rest, Coping strategies, Routine screening Neurological Self-Management Outcome: 4 (good)  HEENT HEENT Symptoms Reported: No symptoms reported HEENT Management Strategies: Adequate rest, Routine screening HEENT Self-Management Outcome: 4 (good)    Cardiovascular Cardiovascular Symptoms Reported: No symptoms reported Does patient have uncontrolled Hypertension?: No Cardiovascular Management Strategies: Adequate rest, Coping strategies, Routine screening Cardiovascular Self-Management Outcome: 4 (good) Cardiovascular Comment: Reports that Sunday at 1103 am was 133/86  Respiratory Respiratory Symptoms Reported: No symptoms reported Respiratory Management Strategies: Adequate rest, Routine screening Respiratory Self-Management Outcome: 4 (good)  Endocrine Endocrine Symptoms Reported: No symptoms reported Is patient diabetic?: No Endocrine Self-Management Outcome: 4 (good)   Gastrointestinal Gastrointestinal Symptoms Reported: No symptoms reported Gastrointestinal Management Strategies: Adequate rest Gastrointestinal Self-Management Outcome: 4 (good)    Genitourinary Genitourinary Symptoms Reported: No symptoms reported Genitourinary Management Strategies: Adequate rest Genitourinary Self-Management Outcome: 4 (good)  Integumentary Integumentary Symptoms Reported: No symptoms reported Skin Management Strategies: Adequate rest, Routine screening Skin Self-Management Outcome: 4 (good)  Musculoskeletal Musculoskelatal Symptoms Reviewed: Back pain Additional Musculoskeletal Details: Reports occassional lower left back pain.  She takes Tyenol as needed. Musculoskeletal Management Strategies: Adequate rest, Coping strategies, Routine screening Musculoskeletal Self-Management Outcome: 4 (good) Falls in the past year?: Yes Number of falls in past year: 1 or less Was there an injury with Fall?: Yes (Reports that she hurt her back.  Footstool collapsed) Fall Risk Category Calculator: 2 Patient Fall Risk Level: Moderate Fall Risk Patient at Risk for Falls Due to: Other (Comment) (Patient reports that chair collapsed at her mother's home.) Fall risk Follow up: Falls evaluation completed, Education provided, Falls prevention discussed  Psychosocial Psychosocial Symptoms Reported: Sadness - if selected complete PHQ 2-9, Depression - if selected complete PHQ 2-9 Additional Psychological Details: Sadness and Depression due to death of her brother Behavioral Management Strategies: Support system, Coping strategies Behavioral Health Comment: LSCW referral for grief couseling Major Change/Loss/Stressor/Fears (CP): Denies, Death of a loved one Techniques to [employer] with Loss/Stress/Change: Support group, Diversional activities Quality of Family Relationships: helpful, involved, supportive Do you feel physically threatened by others?: No    06/16/2024    PHQ2-9 Depression  Screening   Little interest or pleasure in doing things Not at all  Feeling down, depressed, or hopeless Not at all  PHQ-2 - Total Score 0  Trouble falling or staying asleep, or sleeping too much Not at all  Feeling tired or having little energy Not at all  Poor appetite or overeating  Not at all  Feeling bad about yourself - or that you are a failure or have let yourself or  your family down Not at all  Trouble concentrating on things, such as reading the newspaper or watching television Not at all  Moving or speaking so slowly that other people could have noticed.  Or the opposite - being so fidgety or restless that you have been moving around a lot more than usual Not at all  Thoughts that you would be better off dead, or hurting yourself in some way Not at all  PHQ2-9 Total Score 0  If you checked off any problems, how difficult have these problems made it for you to do your work, take care of things at home, or get along with other people Not difficult at all  Depression Interventions/Treatment      There were no vitals filed for this visit.  Medications Reviewed Today     Reviewed by Jorja Nichole LABOR, RN (Case Manager) on 06/16/24 at 1154  Med List Status: <None>   Medication Order Taking? Sig Documenting Provider Last Dose Status Informant  acetaminophen  (TYLENOL ) 500 MG tablet [id number] Yes Take 1,000 mg by mouth 2 (two) times daily as needed. [provider]  Active   albuterol  (VENTOLIN  HFA) 108 (90 Base) MCG/ACT inhaler [id number] Yes INHALE 2 PUFFS EVERY 4 HOURS AS NEEDED ONLY IF YOU CANT CATCH YOUR SHERIDA Diona Perkins, MD  Active   Biotin 89999 MCG TABS [id number] Yes Take 1 tablet by mouth daily. [provider]  Active   fluticasone  furoate-vilanterol (BREO ELLIPTA ) 100-25 MCG/ACT AEPB [id number] Yes Inhale 1 puff into the lungs daily. Diona Perkins, MD  Active   ipratropium-albuterol  (DUONEB) 0.5-2.5 (3) MG/3ML SOLN [id number] Yes Take 3 mLs by nebulization every  4 (four) hours as needed. Diona Perkins, MD  Active   loratadine  (CLARITIN ) 10 MG tablet [id number] Yes TAKE 1 TABLET BY MOUTH EVERY DAY  Patient taking differently: TAKE 1 TABLET BY MOUTH EVERY DAY   Diona Perkins, MD  Active   olmesartan -hydrochlorothiazide (BENICAR  HCT) 20-12.5 MG tablet [id number]  Take 1 tablet by mouth daily.  Patient taking differently: Take 1 tablet by mouth every other day. Pt states BP has been running low; states she plans to talk to PCP on Wednesday this week   McDiarmid, Krystal BIRCH, MD  Active   triamcinolone  ointment (KENALOG ) 0.1 % [id number] Yes Apply topically 2 (two) times daily as needed. Diona Perkins, MD  Active             Recommendation:   PCP Follow-up Specialty provider follow-up : Gastrology-06/16/24 Continue Current Plan of Care  Follow Up Plan:   Telephone follow-up 2 weeks: 07/07/24 @ 11 am  Manal Kreutzer, RN, Scientist, research (physical sciences), Theatre manager [employer] [phone]

## 2024-06-17 ENCOUNTER — Ambulatory Visit: Payer: Self-pay | Admitting: Internal Medicine

## 2024-06-17 ENCOUNTER — Other Ambulatory Visit: Payer: Self-pay | Admitting: Licensed Clinical Social Worker

## 2024-06-17 LAB — SURGICAL PATHOLOGY

## 2024-06-17 NOTE — Patient Outreach (Signed)
 Complex Care Management   Visit Note  06/17/2024  Name:  Taylor Wilkinson MRN: [id number] DOB: 15-May-1976  Situation: Referral received for Complex Care Management related to Insurance  I obtained verbal consent from Patient.  Visit completed with Patient  on the phone  Background:   Past Medical History:  Diagnosis Date   Asthma    Bronchitis    Hypertension    Miscarriage     Assessment: Patient is currently on [employer] and received a letter in the mail stating that she needs to buy new insurance by 09/09/2024. Patient use to have Medicaid but when her child turned 19, the Medicaid was cut off. SW will refer her to thew Medicaid walk in clinic and also discussed about applying online.    SDOH Interventions    Flowsheet Row Patient Outreach Telephone from 06/16/2024 in [place] POPULATION HEALTH DEPARTMENT  SDOH Interventions   Food Insecurity Interventions Intervention Not Indicated  Housing Interventions Intervention Not Indicated  Transportation Interventions Intervention Not Indicated  Utilities Interventions Intervention Not Indicated    Recommendation:   none  Follow Up Plan:   Telephone follow up appointment date/time:  07/01/2024 at 1:00 pm  Taylor Wilkinson Taylor Wilkinson Taylor HEDWIG, PhD [facility], [facility] Social Worker Direct Dial: [phone]  Fax: [phone]

## 2024-06-17 NOTE — Patient Instructions (Signed)
 Visit Information  Thank you for taking time to visit with me today. Please don't hesitate to contact me if I can be of assistance to you before our next scheduled appointment.  Our next appointment is by telephone on 07/01/2024 at 1:00 pm Please call the care guide team at [phone] if you need to cancel or reschedule your appointment.   Following is a copy of your care plan:   Goals Addressed             This Visit's Progress    BSW VBCI Social Work Care Plan       Problems:   insurance  CSW Clinical Goal(s):   Over the next 2 weeks the Patient will will follow up with Medicaid walk in clinic as directed by Social Work.  Interventions:  SW will make a referral to the Medicaid walk in clinic  Patient Goals/Self-Care Activities:  Coordinate with PQA to assist with getting a CM assigned to patient .  Plan:   Telephone follow up appointment with care management team member scheduled for:  07/01/2024 at 1:00 pm        Please call the Suicide and Crisis Lifeline: 988 go to [facility] Urgent [facility] [address] [place] [phone]) call 911 if you are experiencing a Mental Health or Behavioral Health Crisis or need someone to talk to.  Patient verbalizes understanding of instructions and care plan provided today and agrees to view in MyChart. Active MyChart status and patient understanding of how to access instructions and care plan via MyChart confirmed with patient.     Tobias CHARM [name] HEDWIG, PhD [facility], [facility] Social Worker Direct Dial: [phone]  Fax: [phone]

## 2024-06-19 ENCOUNTER — Other Ambulatory Visit: Payer: Self-pay | Admitting: Family Medicine

## 2024-06-19 DIAGNOSIS — J4540 Moderate persistent asthma, uncomplicated: Secondary | ICD-10-CM

## 2024-06-22 ENCOUNTER — Other Ambulatory Visit: Payer: Self-pay

## 2024-06-22 MED ORDER — FLUTICASONE FUROATE-VILANTEROL 100-25 MCG/ACT IN AEPB: 1.0000 | INHALATION_SPRAY | Freq: Every day | RESPIRATORY_TRACT | 5 refills | Status: DC

## 2024-06-22 NOTE — Telephone Encounter (Signed)
 Chart reviewed. Rx refilled.

## 2024-07-01 ENCOUNTER — Other Ambulatory Visit: Payer: Self-pay | Admitting: Licensed Clinical Social Worker

## 2024-07-01 NOTE — Patient Outreach (Signed)
 Complex Care Management   Visit Note  07/01/2024  Name:  Taylor Wilkinson MRN: [id number] DOB: Jun 16, 1976  Situation: Referral received for Complex Care Management related to Medicaid application  I obtained verbal consent from Patient.  Visit completed with Patient  on the phone  Background:   Past Medical History:  Diagnosis Date   Asthma    Bronchitis    Hypertension    Miscarriage     Assessment:Patient has not applied for the Medicaid yet and is willing to use the walk in Medicaid clinic   SDOH Interventions    Flowsheet Row Patient Outreach Telephone from 06/16/2024 in [place] POPULATION HEALTH DEPARTMENT  SDOH Interventions   Food Insecurity Interventions Intervention Not Indicated  Housing Interventions Intervention Not Indicated  Transportation Interventions Intervention Not Indicated  Utilities Interventions Intervention Not Indicated      Recommendation:   SW will send a referral to the Medicaid Walk in clinic  Follow Up Plan:   Telephone follow up appointment date/time:  07/17/2024 at 10:00 am  Tobias CHARM [name] HEDWIG, PhD [facility], [facility] Social Worker Direct Dial: [phone]  Fax: [phone]

## 2024-07-01 NOTE — Patient Instructions (Signed)
 Visit Information  Thank you for taking time to visit with me today. Please don't hesitate to contact me if I can be of assistance to you before our next scheduled appointment.  Your next care management appointment is by telephone on 07/17/2024 at 10:00am   Please call the care guide team at [phone] if you need to cancel, schedule, or reschedule an appointment.   Please call the Suicide and Crisis Lifeline: 988 go to [facility] Urgent [facility] [address] [place] [phone]) call 911 if you are experiencing a Mental Health or Behavioral Health Crisis or need someone to talk to.  Tobias CHARM [name] HEDWIG, PhD [facility], [facility] Social Worker Direct Dial: [phone]  Fax: [phone]

## 2024-07-06 ENCOUNTER — Other Ambulatory Visit: Payer: Self-pay | Admitting: Licensed Clinical Social Worker

## 2024-07-06 NOTE — Patient Instructions (Addendum)
 Visit Information  Thank you for taking time to visit with me today. Please don't hesitate to contact me if I can be of assistance to you before our next scheduled appointment.  Our next appointment is by telephone on 11/12 at 10:30 AM Please call the care guide team at [phone] if you need to cancel or reschedule your appointment.   Following is a copy of your care plan:   Goals Addressed             This Visit's Progress    LCSW VBCI Social Work Care Plan   On track    Problems:   Disease Management support and education needs related to Grief  CSW Clinical Goal(s):   Over the next 90 days the Patient will attend all scheduled medical appointments as evidenced by patient report and care team review of appointment completion in electronic MEDICAL RECORD NUMBER  demonstrate a reduction in symptoms related to Grief .  Interventions:  Mental Health:  Evaluation of current treatment plan related to Grief Active listening / Reflection utilized Financial Risk Analyst / information provided Depression screen reviewed Discussed referral options to connect for ongoing therapy: LCSW discussed options to establish therapy to assist with management of grief symptoms Emotional Support Provided Provided general psycho-education for mental health needs Solution-Focued Strategies employed: Pt agreed to review supportive resources provided   Patient Goals/Self-Care Activities:  Connect with provider for ongoing mental health treatment.   Continue taking your medication as prescribed.   Increase coping skills and self-management skills  Plan:   Telephone follow up appointment with care management team member scheduled for:  2-4 weeks        Please call the Suicide and Crisis Lifeline: 988 go to [facility] Urgent [facility] [address] [place] [phone]) call 911 if you are experiencing a Mental Health or Behavioral Health Crisis or need someone to  talk to.  Patient verbalized understanding of Care plan and visit instructions communicated this visit  Rolin Kerns, LCSW [facility]  [facility], [facility] Clinical Social Worker Direct Dial: [phone]  Fax: [phone] Website: [email or web address] 10:13 AM

## 2024-07-06 NOTE — Patient Outreach (Signed)
 Complex Care Management   Visit Note  07/06/2024  Name:  Taylor Wilkinson MRN: [id number] DOB: [birth year]  Situation: Referral received for Complex Care Management related to Grief I obtained verbal consent from Patient.  Visit completed with Patient  on the phone  Background:   Past Medical History:  Diagnosis Date   Asthma    Bronchitis    Hypertension    Miscarriage     Assessment: Patient Reported Symptoms:  Cognitive Cognitive Status: No symptoms reported, Alert and oriented to person, place, and time, Normal speech and language skills Cognitive/Intellectual Conditions Management [RPT]: None reported or documented in medical history or problem list   Health Maintenance Behaviors: Annual physical exam, Stress management, Sleep adequate  Neurological Neurological Review of Symptoms: No symptoms reported Neurological Management Strategies: Adequate rest, Coping strategies, Routine screening  HEENT HEENT Symptoms Reported: No symptoms reported HEENT Management Strategies: Adequate rest, Routine screening    Cardiovascular Cardiovascular Symptoms Reported: Not assessed, No symptoms reported Does patient have uncontrolled Hypertension?: No Cardiovascular Management Strategies: Adequate rest, Coping strategies, Routine screening  Respiratory Respiratory Symptoms Reported: No symptoms reported Respiratory Management Strategies: Adequate rest, Routine screening  Endocrine Endocrine Symptoms Reported: No symptoms reported Is patient diabetic?: No    Gastrointestinal Gastrointestinal Symptoms Reported: No symptoms reported Gastrointestinal Management Strategies: Adequate rest    Genitourinary Genitourinary Symptoms Reported: No symptoms reported    Integumentary Integumentary Symptoms Reported: No symptoms reported    Musculoskeletal Musculoskelatal Symptoms Reviewed: Back pain Musculoskeletal Management Strategies: Adequate rest, Coping strategies, Medication therapy       Psychosocial Psychosocial Symptoms Reported: Other Other Psychosocial Conditions: Grief Behavioral Management Strategies: Adequate rest, Coping strategies, Counseling, Support system Major Change/Loss/Stressor/Fears (CP): Death of a loved one Techniques to Cope with Loss/Stress/Change: Diversional activities Quality of Family Relationships: involved, helpful, supportive    07/06/2024    PHQ2-9 Depression Screening   Little interest or pleasure in doing things    Feeling down, depressed, or hopeless    PHQ-2 - Total Score    Trouble falling or staying asleep, or sleeping too much    Feeling tired or having little energy    Poor appetite or overeating     Feeling bad about yourself - or that you are a failure or have let yourself or your family down    Trouble concentrating on things, such as reading the newspaper or watching television    Moving or speaking so slowly that other people could have noticed.  Or the opposite - being so fidgety or restless that you have been moving around a lot more than usual    Thoughts that you would be better off dead, or hurting yourself in some way    PHQ2-9 Total Score    If you checked off any problems, how difficult have these problems made it for you to do your work, take care of things at home, or get along with other people    Depression Interventions/Treatment      There were no vitals filed for this visit.  Medications Reviewed Today     Reviewed by Lanell Dubie D, LCSW (Social Worker) on 07/06/24 at 0912  Med List Status: <None>   Medication Order Taking? Sig Documenting Provider Last Dose Status Informant  acetaminophen  (TYLENOL ) 500 MG tablet [id number]  Take 1,000 mg by mouth 2 (two) times daily as needed. [provider]  Active   albuterol  (VENTOLIN  HFA) 108 (90 Base) MCG/ACT inhaler [id number]  INHALE 2 PUFFS EVERY 4 HOURS  AS NEEDED FOR WHEEZING OR DIFFICULTY BREATHING Diona Perkins, MD  Active   Biotin 89999 MCG TABS  [id number]  Take 1 tablet by mouth daily. [provider]  Active   fluticasone  furoate-vilanterol (BREO ELLIPTA ) 100-25 MCG/ACT AEPB [id number]  Inhale 1 puff into the lungs daily. Diona Perkins, MD  Active   ipratropium-albuterol  (DUONEB) 0.5-2.5 (3) MG/3ML SOLN [id number]  Take 3 mLs by nebulization every 4 (four) hours as needed. Diona Perkins, MD  Active   loratadine  (CLARITIN ) 10 MG tablet [id number]  TAKE 1 TABLET BY MOUTH EVERY DAY  Patient taking differently: TAKE 1 TABLET BY MOUTH EVERY DAY   Diona Perkins, MD  Active   olmesartan -hydrochlorothiazide (BENICAR  HCT) 20-12.5 MG tablet [id number] No Take 1 tablet by mouth daily.  Patient taking differently: Take 1 tablet by mouth every other day. Pt states BP has been running low; states she plans to talk to PCP on Wednesday this week   McDiarmid, Krystal BIRCH, MD 06/15/2024 Active   olmesartan -hydrochlorothiazide (BENICAR  HCT) 20-12.5 MG tablet [id number]  Take 1 tablet by mouth daily. [provider]  Active   triamcinolone  ointment (KENALOG ) 0.1 % [id number]  Apply topically 2 (two) times daily as needed. Diona Perkins, MD  Active             Recommendation:   Continue Current Plan of Care  Follow Up Plan:   Telephone follow-up 2 weeks  Rolin Kerns, LCSW [facility]  [facility], [facility] Clinical Social Worker Direct Dial: [phone]  Fax: [phone] Website: [email or web address] 10:12 AM

## 2024-07-07 ENCOUNTER — Telehealth: Payer: Self-pay | Admitting: *Deleted

## 2024-07-17 ENCOUNTER — Other Ambulatory Visit: Payer: Self-pay | Admitting: Licensed Clinical Social Worker

## 2024-07-17 ENCOUNTER — Encounter: Payer: Self-pay | Admitting: Licensed Clinical Social Worker

## 2024-07-17 NOTE — Patient Outreach (Signed)
 Social Drivers of Health  Community Resource and Care Coordination Visit Note   07/17/2024  Name: Taylor Wilkinson MRN: [id number] DOB:[birth year]  Situation: Referral received for [facility] needs assessment and assistance related to Medicaid. I obtained verbal consent from Patient.  Visit completed with Patient on the phone.   Background:      Assessment:   Goals Addressed             This Visit's Progress    COMPLETED: BSW VBCI Social Work Care Plan       Problems:   insurance  CSW Clinical Goal(s):   Over the next 2 weeks the Patient will will follow up with Medicaid walk in clinic as directed by Social Work.  Interventions:  SW will make a referral to the Medicaid walk in clinic  Patient Goals/Self-Care Activities:  Coordinate with PQA to assist with getting a CM assigned to patient .  Plan:   Telephone follow up appointment with care management team member scheduled for:  08/03/2024 at 10:00 am        Recommendation:   SW received the following message via email from the Medicaid walk I clinic that they contacted the patient today and she is now taken care of, the SW also called the patient and confirmed that the clinic called and the patient stated that they called and completed the Medicaid application and signed using a voice consent over the phone. The SW reminded the patient about upcoming appointment with the [facility] and the LCSW and told that patient that she will be closing her BSW part of the case and if hshe needed any other SDOH needs to let the PCP know or the [facility] or LCSW   Follow Up Plan:   Patient has achieved all patient stated goals. [employer] will be closed. Patient has been provided contact information should new needs arise.   Taylor CHARM [name] HEDWIG, PhD [facility], [facility] Social Worker Direct Dial: [phone]  Fax: [phone]

## 2024-07-17 NOTE — Patient Outreach (Signed)
 Social Drivers of Health  Community Resource and Care Coordination Visit Note   07/17/2024  Name: Taylor Wilkinson MRN: [id number] DOB:1976/04/02  Situation: Referral received for [facility] needs assessment and assistance related to Medicaid. I obtained verbal consent from Patient.  Visit completed with Patient on the phone.   Background:      Assessment:   Goals Addressed             This Visit's Progress    BSW VBCI Social Work Care Plan       Problems:   insurance  CSW Clinical Goal(s):   Over the next 2 weeks the Patient will will follow up with Medicaid walk in clinic as directed by Social Work.  Interventions:  SW will make a referral to the Medicaid walk in clinic  Patient Goals/Self-Care Activities:  Coordinate with PQA to assist with getting a CM assigned to patient .  Plan:   Telephone follow up appointment with care management team member scheduled for:  08/03/2024 at 10:00 am        Recommendation:   Connect with the Walk in clinic to discuss applying for Medicaid   Follow Up Plan:   Telephone follow up appointment date/time:  04/02/2024 at 10:00 am  Tobias CHARM [name] HEDWIG, PhD [facility], [facility] Social Worker Direct Dial: [phone]  Fax: [phone]

## 2024-07-17 NOTE — Patient Instructions (Signed)
 Visit Information  Thank you for taking time to visit with me today. Please don't hesitate to contact me if I can be of assistance to you before our next scheduled appointment.  Your next care management appointment is no further scheduled appointments.  on     Please call the care guide team at [phone] if you need to cancel, schedule, or reschedule an appointment.   Please call the Suicide and Crisis Lifeline: 988 go to [facility] Urgent [facility] [address], [place] [phone]) call 911 if you are experiencing a Mental Health or Behavioral Health Crisis or need someone to talk to.  Tobias CHARM [name] HEDWIG, PhD [facility], [facility] Social Worker Direct Dial: [phone]  Fax: [phone]

## 2024-07-17 NOTE — Patient Instructions (Signed)
 Visit Information  Thank you for taking time to visit with me today. Please don't hesitate to contact me if I can be of assistance to you before our next scheduled appointment.  Your next care management appointment is by telephone on 08/03/2024 at 10:00 am   Please call the care guide team at [phone] if you need to cancel, schedule, or reschedule an appointment.   Please call the Suicide and Crisis Lifeline: 988 go to [facility]gent [facility] [address] [place] [phone]) call 911 if you are experiencing a Mental Health or Behavioral Health Crisis or need someone to talk to.  Tobias CHARM [name] HEDWIG, PhD [facility], [facility] Social Worker Direct Dial: [phone]  Fax: [phone]

## 2024-07-22 ENCOUNTER — Other Ambulatory Visit: Payer: Self-pay | Admitting: Licensed Clinical Social Worker

## 2024-07-22 NOTE — Patient Outreach (Signed)
 Complex Care Management   Visit Note  07/22/2024  Name:  Taylor Wilkinson MRN: [id number] DOB: [birth year]  Situation: Referral received for Complex Care Management related to Grief/Stress I obtained verbal consent from Patient.  Visit completed with Patient  on the phone  Background:   Past Medical History:  Diagnosis Date   Asthma    Bronchitis    Hypertension    Miscarriage     Assessment: Patient Reported Symptoms:  Cognitive Cognitive Status: No symptoms reported, Alert and oriented to person, place, and time, Normal speech and language skills Cognitive/Intellectual Conditions Management [RPT]: None reported or documented in medical history or problem list      Neurological Neurological Review of Symptoms: No symptoms reported Neurological Management Strategies: Adequate rest, Coping strategies, Routine screening  HEENT HEENT Symptoms Reported: No symptoms reported      Cardiovascular Cardiovascular Symptoms Reported: No symptoms reported    Respiratory Respiratory Symptoms Reported: No symptoms reported    Endocrine Endocrine Symptoms Reported: No symptoms reported Is patient diabetic?: No    Gastrointestinal Gastrointestinal Symptoms Reported: No symptoms reported      Genitourinary Genitourinary Symptoms Reported: No symptoms reported    Integumentary Integumentary Symptoms Reported: No symptoms reported    Musculoskeletal Musculoskelatal Symptoms Reviewed: Back pain Musculoskeletal Management Strategies: Adequate rest, Coping strategies, Medication therapy      Psychosocial Psychosocial Symptoms Reported: Other, Report of significant loss, deaths, abandonment, traumatic incidents Other Psychosocial Conditions: Grief          07/22/2024    PHQ2-9 Depression Screening   Little interest or pleasure in doing things    Feeling down, depressed, or hopeless    PHQ-2 - Total Score    Trouble falling or staying asleep, or sleeping too much    Feeling tired  or having little energy    Poor appetite or overeating     Feeling bad about yourself - or that you are a failure or have let yourself or your family down    Trouble concentrating on things, such as reading the newspaper or watching television    Moving or speaking so slowly that other people could have noticed.  Or the opposite - being so fidgety or restless that you have been moving around a lot more than usual    Thoughts that you would be better off dead, or hurting yourself in some way    PHQ2-9 Total Score    If you checked off any problems, how difficult have these problems made it for you to do your work, take care of things at home, or get along with other people    Depression Interventions/Treatment      There were no vitals filed for this visit.    Medications Reviewed Today     Reviewed by Hilmer Aliberti D, LCSW (Social Worker) on 07/22/24 at 1039  Med List Status: <None>   Medication Order Taking? Sig Documenting Provider Last Dose Status Informant  acetaminophen  (TYLENOL ) 500 MG tablet [id number]  Take 1,000 mg by mouth 2 (two) times daily as needed. [provider]  Active   albuterol  (VENTOLIN  HFA) 108 (90 Base) MCG/ACT inhaler [id number]  INHALE 2 PUFFS EVERY 4 HOURS AS NEEDED FOR WHEEZING OR DIFFICULTY BREATHING Diona Perkins, MD  Active   Biotin 89999 MCG TABS [id number]  Take 1 tablet by mouth daily. [provider]  Active   fluticasone  furoate-vilanterol (BREO ELLIPTA ) 100-25 MCG/ACT AEPB [id number]  Inhale 1 puff into the lungs daily. Diona Perkins, MD  Active   ipratropium-albuterol  (DUONEB) 0.5-2.5 (3) MG/3ML SOLN [id number]  Take 3 mLs by nebulization every 4 (four) hours as needed. Diona Perkins, MD  Active   loratadine  (CLARITIN ) 10 MG tablet [id number]  TAKE 1 TABLET BY MOUTH EVERY DAY  Patient taking differently: TAKE 1 TABLET BY MOUTH EVERY DAY   Diona Perkins, MD  Active   olmesartan -hydrochlorothiazide (BENICAR  HCT) 20-12.5 MG tablet [id number] No Take 1  tablet by mouth daily.  Patient taking differently: Take 1 tablet by mouth every other day. Pt states BP has been running low; states she plans to talk to PCP on Wednesday this week   McDiarmid, Krystal BIRCH, MD 06/15/2024 Active   olmesartan -hydrochlorothiazide (BENICAR  HCT) 20-12.5 MG tablet [id number]  Take 1 tablet by mouth daily. [provider]  Active   triamcinolone  ointment (KENALOG ) 0.1 % [id number]  Apply topically 2 (two) times daily as needed. Diona Perkins, MD  Active             Recommendation:   Continue Current Plan of Care  Follow Up Plan:   Telephone follow-up in 1 month  Rolin Kerns, LCSW [facility]  [facility], [facility] Clinical Social Worker Direct Dial: [phone]  Fax: [phone] Website: [email or web address] 12:13 PM

## 2024-07-22 NOTE — Patient Instructions (Signed)
 Visit Information  Thank you for taking time to visit with me today. Please don't hesitate to contact me if I can be of assistance to you before our next scheduled appointment.  Your next care management appointment is by telephone on 12/10 at 10:30 AM  Please call the care guide team at [phone] if you need to cancel, schedule, or reschedule an appointment.   Please call the Suicide and Crisis Lifeline: 988 go to [facility] Urgent [facility] [address], [place] [phone]) call 911 if you are experiencing a Mental Health or Behavioral Health Crisis or need someone to talk to.  Rolin Kerns, LCSW [place]  [facility], [facility] Clinical Social Worker Direct Dial: [phone]  Fax: [phone] Website: [email or web address] 12:14 PM

## 2024-07-31 ENCOUNTER — Telehealth: Payer: Self-pay | Admitting: *Deleted

## 2024-08-03 ENCOUNTER — Other Ambulatory Visit: Admitting: Licensed Clinical Social Worker

## 2024-08-12 ENCOUNTER — Other Ambulatory Visit: Payer: Self-pay | Admitting: Family Medicine

## 2024-08-12 DIAGNOSIS — R030 Elevated blood-pressure reading, without diagnosis of hypertension: Secondary | ICD-10-CM

## 2024-08-13 ENCOUNTER — Telehealth: Payer: Self-pay | Admitting: *Deleted

## 2024-08-16 ENCOUNTER — Other Ambulatory Visit: Payer: Self-pay

## 2024-08-16 ENCOUNTER — Emergency Department (HOSPITAL_COMMUNITY)
Admission: EM | Admit: 2024-08-16 | Discharge: 2024-08-16 | Disposition: A | Discharge status: Home / Self Care | Attending: Emergency Medicine | Admitting: Emergency Medicine

## 2024-08-16 ENCOUNTER — Emergency Department (HOSPITAL_COMMUNITY)

## 2024-08-16 ENCOUNTER — Other Ambulatory Visit: Payer: Self-pay | Admitting: Family Medicine

## 2024-08-16 DIAGNOSIS — R030 Elevated blood-pressure reading, without diagnosis of hypertension: Secondary | ICD-10-CM

## 2024-08-16 DIAGNOSIS — Z23 Encounter for immunization: Secondary | ICD-10-CM | POA: Diagnosis not present

## 2024-08-16 DIAGNOSIS — X58XXXA Exposure to other specified factors, initial encounter: Secondary | ICD-10-CM | POA: Diagnosis not present

## 2024-08-16 DIAGNOSIS — W458XXA Other foreign body or object entering through skin, initial encounter: Secondary | ICD-10-CM | POA: Diagnosis not present

## 2024-08-16 DIAGNOSIS — S60455A Superficial foreign body of left ring finger, initial encounter: Secondary | ICD-10-CM | POA: Diagnosis not present

## 2024-08-16 DIAGNOSIS — S6992XA Unspecified injury of left wrist, hand and finger(s), initial encounter: Secondary | ICD-10-CM

## 2024-08-16 MED ORDER — LIDOCAINE HCL (PF) 1 % IJ SOLN
5.0000 mL | Freq: Once | INTRAMUSCULAR | Status: AC
  Administered 2024-08-16: 5 mL
  Filled 2024-08-16: qty 30

## 2024-08-16 MED ORDER — CEPHALEXIN 500 MG PO CAPS: 500.0000 mg | ORAL_CAPSULE | Freq: Two times a day (BID) | ORAL | 0 refills | Status: AC

## 2024-08-16 MED ORDER — TETANUS-DIPHTH-ACELL PERTUSSIS 5-2-15.5 LF-MCG/0.5 IM SUSP
0.5000 mL | Freq: Once | INTRAMUSCULAR | Status: AC
  Administered 2024-08-16: 0.5 mL via INTRAMUSCULAR
  Filled 2024-08-16: qty 0.5

## 2024-08-16 NOTE — ED Notes (Signed)
 Patient came out complaining that she is still sitting there. That she has not seen anyone in an hour. I explained to her that the PA would be up here as soon as she could and was going to try to address her finger. She went back to the room talking out loud how staff is not doing anything and she is just sitting there. I informed her there is nothing we can do but message the doctor, she is the one that is going to be doing the removal, that we have nothing to do with that part. Informed PA of same.

## 2024-08-16 NOTE — ED Triage Notes (Signed)
 Pt came in for left ring finger issue. Pt was cleaning the floors and now has a splinter under her nail.

## 2024-08-16 NOTE — Discharge Instructions (Signed)
 Thank you for visiting the Emergency Department today. It was a pleasure to be part of your healthcare team.  You were seen today for a splinter in your left ring finger.  The splinter was removed, a corner of your nail was also removed.  As discussed, please keep finger clean with antibacterial soap. You have been prescribed an antibiotic - please take full course.  Please follow-up with your primary care provider in 1 week for reevaluation. Thank you for trusting us  with your health.

## 2024-08-16 NOTE — ED Provider Notes (Signed)
 [place] EMERGENCY DEPARTMENT AT [facility] Provider Note   CSN: [id number] Arrival date & time: 08/16/24  1545     Patient presents with: No chief complaint on file.   Taylor Wilkinson is a 48 y.o. female who presents to the emergency department for evaluation for a wood splinter beneath the nail of her left ring finger.  She reports she was cleaning her hardwood floors when her hand slipped and she felt the piece of wood penetrate the nail.  She was initially evaluated at urgent care, where she was told they cannot remove the piece of wood, and was referred to the emergency department for further management.  She denies numbness or altered sensation to the digit and appears in no acute distress.   HPI     Prior to Admission medications   Medication Sig Start Date End Date Taking? Authorizing Provider  cephALEXin  (KEFLEX ) 500 MG capsule Take 1 capsule (500 mg total) by mouth 2 (two) times daily for 7 days. 08/16/24 08/23/24 Yes Milferd Ansell L, PA  acetaminophen  (TYLENOL ) 500 MG tablet Take 1,000 mg by mouth 2 (two) times daily as needed.    [provider]  albuterol  (VENTOLIN  HFA) 108 (90 Base) MCG/ACT inhaler INHALE 2 PUFFS EVERY 4 HOURS AS NEEDED FOR WHEEZING OR DIFFICULTY BREATHING 06/19/24   Diona Perkins, MD  Biotin 89999 MCG TABS Take 1 tablet by mouth daily.    [provider]  fluticasone  furoate-vilanterol (BREO ELLIPTA ) 100-25 MCG/ACT AEPB Inhale 1 puff into the lungs daily. 06/22/24   Diona Perkins, MD  ipratropium-albuterol  (DUONEB) 0.5-2.5 (3) MG/3ML SOLN Take 3 mLs by nebulization every 4 (four) hours as needed. 04/11/23   Diona Perkins, MD  loratadine  (CLARITIN ) 10 MG tablet TAKE 1 TABLET BY MOUTH EVERY DAY Patient taking differently: TAKE 1 TABLET BY MOUTH EVERY DAY 04/13/24   Diona Perkins, MD  olmesartan -hydrochlorothiazide (BENICAR  HCT) 20-12.5 MG tablet Take 1 tablet by mouth daily. Patient taking differently: Take 1 tablet by mouth every other  day. Pt states BP has been running low; states she plans to talk to PCP on Wednesday this week 04/27/24   McDiarmid, Krystal BIRCH, MD  olmesartan -hydrochlorothiazide (BENICAR  HCT) 20-12.5 MG tablet Take 1 tablet by mouth daily.    [provider]  triamcinolone  ointment (KENALOG ) 0.1 % Apply topically 2 (two) times daily as needed. 06/03/24   Diona Perkins, MD    Allergies: Patient has no known allergies.    Review of Systems  Skin:  Positive for wound.       Foreign body    Updated Vital Signs BP 131/85 (BP Location: Right Arm)   Pulse 77   Temp 98 F (36.7 C) (Oral)   Resp 18   Ht 5' 5 (1.651 m)   Wt 86.2 kg   SpO2 100%   BMI 31.62 kg/m   Physical Exam Vitals and nursing note reviewed.  Constitutional:      General: She is not in acute distress.    Appearance: Normal appearance.  HENT:     Head: Normocephalic and atraumatic.  Eyes:     Extraocular Movements: Extraocular movements intact.     Conjunctiva/sclera: Conjunctivae normal.     Pupils: Pupils are equal, round, and reactive to light.  Cardiovascular:     Rate and Rhythm: Normal rate and regular rhythm.     Pulses: Normal pulses.  Pulmonary:     Effort: Pulmonary effort is normal. No respiratory distress.  Musculoskeletal:  General: Normal range of motion.     Cervical back: Normal range of motion.     Comments: A linear wooden foreign body approximately 9.31mm in length is visualized beneath the distal nail plate.  Splinter is intact with edge protruding slightly beyond the tip of the nail.  The nailbed is intact.  No involvement of the cuticle. There is no surrounding soft tissue trauma.  Sensation and capillary refill are intact.  Skin:    General: Skin is warm and dry.     Capillary Refill: Capillary refill takes less than 2 seconds.  Neurological:     General: No focal deficit present.     Mental Status: She is alert. Mental status is at baseline.  Psychiatric:        Mood and Affect: Mood normal.      (all labs ordered are listed, but only abnormal results are displayed) Labs Reviewed - No data to display  EKG: None  Radiology: DG Hand 2 View Left Result Date: 08/16/2024 CLINICAL DATA:  Splinter under fourth digit fingernail EXAM: LEFT HAND - 2 VIEW COMPARISON:  11/01/2006 FINDINGS: Frontal, oblique, and lateral views of the left hand are obtained. No acute fracture, subluxation, or dislocation. Joint spaces are well preserved. Soft tissues are unremarkable. There is no radiopaque foreign body. IMPRESSION: 1. Unremarkable left hand. 2. No radiopaque foreign body. Please note that wood is not typically radiopaque. Electronically Signed   By: Ozell Daring M.D.   On: 08/16/2024 17:21     .Nerve Block  Date/Time: 08/16/2024 6:51 PM  Performed by: Willma Duwaine CROME, PA Authorized by: Willma Duwaine CROME, PA   Consent:    Consent obtained:  Verbal   Consent given by:  Patient   Risks, benefits, and alternatives were discussed: yes     Risks discussed:  Allergic reaction, infection, nerve damage, swelling, unsuccessful block, pain, intravenous injection and bleeding   Alternatives discussed:  No treatment, delayed treatment and alternative treatment Universal protocol:    Procedure explained and questions answered to patient or proxy's satisfaction: yes     Relevant documents present and verified: yes     Patient identity confirmed:  Verbally with patient Indications:    Indications:  Pain relief Location:    Body area:  Upper extremity   Upper extremity nerve:  Metacarpal   Laterality:  Left Pre-procedure details:    Skin preparation:  Chlorhexidine   Preparation: Patient was prepped and draped in usual sterile fashion   Skin anesthesia:    Skin anesthesia method:  None Procedure details:    Block needle gauge:  25 G   Anesthetic injected:  Lidocaine  1% w/o epi   Steroid injected:  None   Additive injected:  None   Injection procedure:  Negative aspiration for blood    Paresthesia:  None Post-procedure details:    Dressing:  None   Outcome:  Anesthesia achieved   Procedure completion:  Tolerated well, no immediate complications    Medications Ordered in the ED  lidocaine  (PF) (XYLOCAINE ) 1 % injection 5 mL (5 mLs Infiltration Given by Other 08/16/24 1850)  Tdap (ADACEL ) injection 0.5 mL (0.5 mLs Intramuscular Given 08/16/24 1900)                                 Medical Decision Making Amount and/or Complexity of Data Reviewed Radiology: ordered.  Risk Prescription drug management.   Patient presents to the ED  for: Foreign body removal  Clinical Course as of 08/18/24 0945  Sun Aug 16, 2024  1553 Temp: 98.2 F (36.8 C) Patient afebrile, vital stable, patient in no acute distress. [ML]  1730 DG Hand 2 View Left No acute findings [ML]  1820 Nerve block successful.  Foreign body successfully removed. [ML]    Clinical Course User Index [ML] Willma Duwaine CROME, [place]    Data Reviewed / Actions Taken: Imaging ordered/reviewed with my independent interpretation in ED course above. I agree with the radiologists interpretation.   Management / Treatments: See ED course above for medications, treatments administered, and clinical rationale.   I have reviewed the patients home medicines and have made adjustments as needed  ED Course / Reassessments: Problem List: Foreign body removal 48 year old female presented for foreign body under her left nail. Initial assessment included history, physical exam, and review of prior medical records. Patients physical exam revealed a large, wooden splinter under her left nail. Management included foreign body removal: a nerve block was utilized for anesthetic, foreign body was successfully removed with forceps, however, on removal some spots of debris were still left under the nail - a small distal corner of the nail was trimmed back to the level of the cuticle using surgical scissors and forceps to facilitate the removal  of the retained splinter fragments.  Only minimal nailbed disruption occurred during nail removal, and the underlying nailbed remained largely intact.  Patient tolerated the procedure well, and was given Tdap injection afterwards.  Patient was prescribed a short course of antibiotics given wooden splinter was acquired when she was cleaning floors and the potential for introducing bacteria. Patient was given wound care instructions and discharged with close follow-up with PCP for further evaluation and care. Patient response: improved  Disposition: Disposition: Discharge with close follow-up with PCP for further evaluation and care Rationale for disposition: stable for discharge The disposition plan and rationale were discussed with the patient at the bedside, all questions were addressed, and the patient demonstrated understanding.  This note was produced using Electronics Engineer. While I have reviewed and verified all clinical information, transcription errors may remain.      Final diagnoses:  Injury of nail bed of finger of left hand, initial encounter    ED Discharge Orders          Ordered    cephALEXin  (KEFLEX ) 500 MG capsule  2 times daily        08/16/24 1859               Willma Duwaine CROME, [place] 08/18/24 [id number]

## 2024-08-19 ENCOUNTER — Other Ambulatory Visit: Payer: Self-pay | Admitting: Licensed Clinical Social Worker

## 2024-09-04 ENCOUNTER — Other Ambulatory Visit: Payer: Self-pay | Admitting: *Deleted

## 2024-09-04 NOTE — Patient Instructions (Signed)
 Shona HERO Marmol - I am sorry I was unable to reach you today for our scheduled appointment. I work with Diona Perkins, MD and am calling to support your healthcare needs. Please contact me at [phone] at your earliest convenience. I have rescheduled your appointment for 10/21/24 @ 9:30 am.   I look forward to speaking with you soon.   Thank you,  Gerado Nabers, RN, BSN, ACM RN Care Manager [employer] [phone]

## 2024-09-07 ENCOUNTER — Ambulatory Visit: Admitting: Family Medicine

## 2024-09-07 ENCOUNTER — Encounter: Payer: Self-pay | Admitting: Family Medicine

## 2024-09-07 VITALS — BP 143/93 | HR 81 | Temp 98.5°F | Wt 193.6 lb

## 2024-09-07 DIAGNOSIS — J4540 Moderate persistent asthma, uncomplicated: Secondary | ICD-10-CM

## 2024-09-07 DIAGNOSIS — I10 Essential (primary) hypertension: Secondary | ICD-10-CM | POA: Diagnosis not present

## 2024-09-07 MED ORDER — BUDESONIDE-FORMOTEROL FUMARATE 160-4.5 MCG/ACT IN AERO: 2.0000 | INHALATION_SPRAY | Freq: Two times a day (BID) | RESPIRATORY_TRACT | 12 refills | Status: AC

## 2024-09-07 MED ORDER — OLMESARTAN MEDOXOMIL-HCTZ 20-12.5 MG PO TABS: 1.0000 | ORAL_TABLET | Freq: Every day | ORAL | 3 refills | Status: AC

## 2024-09-07 NOTE — Assessment & Plan Note (Signed)
 Well-controlled on current regimen as above. Given insurance changes, sent for Symbicort  in place of Breo Ellipta .

## 2024-09-07 NOTE — Assessment & Plan Note (Signed)
 BP elevated on repeat check today. Patient asymptomatic. Home readings from last month appear in goal range while on medication. Discussed continuing prior regimen of olmesartan -hydrochlorothiazide 20-12.5 daily. Continue daily home BP checks. Monitoring for hypotension/hypotensive symptoms discussed. RTC in 1-2 weeks for BP follow up and kidney function evaluation following new adherence to medication.

## 2024-09-07 NOTE — Patient Instructions (Addendum)
 Thank you for visiting clinic today and allowing us  to participate in your care!  Please take your blood pressure medication (olmesartan -hydrochlorothiazide) daily. Continue to check your blood pressure at home daily. If the top number drops below 100 or the bottom number drops below 65, please let us  know. Plan to return to clinic in 1-2 weeks for follow up.   We switched your maintenance inhaler to Symbicort  2puffs twice a day to help with insurance coverage. When you run out of Breo Ellipta , please switch to Symbicort .   Reach out any time with any questions or concerns you may have - we are here for you!  Damien Cassis, MD [facility] [phone]

## 2024-09-07 NOTE — Progress Notes (Signed)
" ° ° °  SUBJECTIVE:   CHIEF COMPLAINT / HPI:   HTN -Has not taken medications for a few weeks, not taking regularly  -Last took medication Nov 30  -Readings from November when taking medication: Nov 30: 113/73  Nov 20: 109/73  Nov 11: 113/70  -Has not checked BP in past couple of weeks  -No recent HA, CP, SOB, VC -Previously interested in non-medication approach to BP control, is now open to meds  Asthma -Current regimen: Breo Ellipta  daily, Albuterol  prn -Regimen working well overall  -Only uses albuterol  occasionally prior to exercise  -Reports recent insurance change, was informed by pharmacy that the following would be covered going forward:  Budes/formo Aer 160-4.5 Wixela inhub Aero 250/50 Fluticasone /Salm aer 250/50 -Has tried several inhalers in the past, Breo Ellipta  and Symbicort  have worked best   PERTINENT  PMH / PSH: HTN, Asthma   OBJECTIVE:   BP (!) 143/93   Pulse 81   Temp 98.5 F (36.9 C) (Oral)   Wt 193 lb 9.6 oz (87.8 kg)   LMP 08/19/2024 (Exact Date)   SpO2 100%   BMI 32.22 kg/m   General: Well-appearing. Resting comfortably in room. CV: Normal S1/S2. No extra heart sounds. Warm and well-perfused. Pulm: Breathing comfortably on room air. CTAB. No wheezing. No increased WOB. Skin:  Warm, dry. Psych: Pleasant and appropriate.    ASSESSMENT/PLAN:   Assessment & Plan Hypertension, unspecified type BP elevated on repeat check today. Patient asymptomatic. Home readings from last month appear in goal range while on medication. Discussed continuing prior regimen of olmesartan -hydrochlorothiazide 20-12.5 daily. Continue daily home BP checks. Monitoring for hypotension/hypotensive symptoms discussed. RTC in 1-2 weeks for BP follow up and kidney function evaluation following new adherence to medication.  Moderate persistent asthma without complication Well-controlled on current regimen as above. Given insurance changes, sent for Symbicort  in place of Breo  Ellipta.    RTC in 1-2 weeks.  Damien Cassis, MD [facility]  "

## 2024-09-09 NOTE — Patient Instructions (Signed)
 Visit Information  Thank you for taking time to visit with me today. Please don't hesitate to contact me if I can be of assistance to you before our next scheduled appointment.  Your next care management appointment is by telephone on 1/7 at 10:30 AM  Please call the care guide team at [phone] if you need to cancel, schedule, or reschedule an appointment.   Please call the Suicide and Crisis Lifeline: 988 go to [facility] Urgent [facility] [address], [place] [phone]) call 911 if you are experiencing a Mental Health or Behavioral Health Crisis or need someone to talk to.  Rolin Kerns, LCSW [place]  [facility], [facility] Clinical Social Worker Direct Dial: [phone]  Fax: [phone] Website: [email or web address] 2:39 PM

## 2024-09-09 NOTE — Patient Outreach (Signed)
 Complex Care Management   Visit Note  08/19/2024  Name:  Taylor Wilkinson MRN: [id number] DOB: 06-02-1976  Situation: Referral received for Complex Care Management related to Grief I obtained verbal consent from Patient.  Visit completed with Patient  on the phone  Background:   Past Medical History:  Diagnosis Date   Asthma    Bronchitis    Hypertension    Miscarriage     Assessment: Patient Reported Symptoms:  Cognitive Cognitive Status: No symptoms reported, Normal speech and language skills, Alert and oriented to person, place, and time Cognitive/Intellectual Conditions Management [RPT]: None reported or documented in medical history or problem list   Health Maintenance Behaviors: Annual physical exam  Neurological Neurological Review of Symptoms: Not assessed    HEENT HEENT Symptoms Reported: Not assessed      Cardiovascular Cardiovascular Symptoms Reported: Not assessed    Respiratory Respiratory Symptoms Reported: Not assesed    Endocrine Endocrine Symptoms Reported: Not assessed    Gastrointestinal Gastrointestinal Symptoms Reported: Not assessed      Genitourinary Genitourinary Symptoms Reported: Not assessed    Integumentary Integumentary Symptoms Reported: Not assessed    Musculoskeletal Musculoskelatal Symptoms Reviewed: Back pain Musculoskeletal Management Strategies: Coping strategies, Medication therapy, Routine screening      Psychosocial Psychosocial Symptoms Reported: Report of significant loss, deaths, abandonment, traumatic incidents, Other Other Psychosocial Conditions: Stress Behavioral Management Strategies: Coping strategies, Support system, Adequate rest Major Change/Loss/Stressor/Fears (CP): Death of a loved one, Medical condition, self Techniques to Cope with Loss/Stress/Change: Diversional activities      09/09/2024    PHQ2-9 Depression Screening   Little interest or pleasure in doing things    Feeling down, depressed, or hopeless     PHQ-2 - Total Score    Trouble falling or staying asleep, or sleeping too much    Feeling tired or having little energy    Poor appetite or overeating     Feeling bad about yourself - or that you are a failure or have let yourself or your family down    Trouble concentrating on things, such as reading the newspaper or watching television    Moving or speaking so slowly that other people could have noticed.  Or the opposite - being so fidgety or restless that you have been moving around a lot more than usual    Thoughts that you would be better off dead, or hurting yourself in some way    PHQ2-9 Total Score    If you checked off any problems, how difficult have these problems made it for you to do your work, take care of things at home, or get along with other people    Depression Interventions/Treatment      There were no vitals filed for this visit.    Medications Reviewed Today     Reviewed by Annastyn Silvey D, LCSW (Social Worker) on 09/09/24 at 1434  Med List Status: <None>   Medication Order Taking? Sig Documenting Provider Last Dose Status Informant  acetaminophen  (TYLENOL ) 500 MG tablet [id number]  Take 1,000 mg by mouth 2 (two) times daily as needed. [provider]  Active   albuterol  (VENTOLIN  HFA) 108 (90 Base) MCG/ACT inhaler [id number]  INHALE 2 PUFFS EVERY 4 HOURS AS NEEDED FOR WHEEZING OR DIFFICULTY BREATHING Diona Perkins, MD  Active   Biotin 89999 MCG TABS [id number]  Take 1 tablet by mouth daily. [provider]  Active   budesonide -formoterol  (SYMBICORT ) 160-4.5 MCG/ACT inhaler [id number]  Inhale 2 puffs into  the lungs 2 (two) times daily. Diona Perkins, MD  Active   ipratropium-albuterol  (DUONEB) 0.5-2.5 (3) MG/3ML SOLN [id number]  Take 3 mLs by nebulization every 4 (four) hours as needed. Diona Perkins, MD  Active   loratadine  (CLARITIN ) 10 MG tablet [id number]  TAKE 1 TABLET BY MOUTH EVERY DAY Diona Perkins, MD  Active   olmesartan -hydrochlorothiazide (BENICAR   HCT) 20-12.5 MG tablet [id number]  Take 1 tablet by mouth daily. Diona Perkins, MD  Active   triamcinolone  ointment (KENALOG ) 0.1 % [id number]  Apply topically 2 (two) times daily as needed. Diona Perkins, MD  Active             Recommendation:   PCP Follow-up  Follow Up Plan:   Telephone follow-up in 1 month  Rolin Kerns, LCSW [facility]  [facility], [facility] Clinical Social Worker Direct Dial: [phone]  Fax: [phone] Website: [email or web address] 2:39 PM

## 2024-09-16 ENCOUNTER — Other Ambulatory Visit: Payer: Self-pay | Admitting: Licensed Clinical Social Worker

## 2024-09-16 NOTE — Patient Instructions (Signed)
 Visit Information  Thank you for taking time to visit with me today. Please don't hesitate to contact me if I can be of assistance to you before our next scheduled appointment.  Your next care management appointment is by telephone on 2/4 at 11:30 AM  Please call the care guide team at [phone] if you need to cancel, schedule, or reschedule an appointment.   Please call the Suicide and Crisis Lifeline: 988 go to [facility] [address], [place] [phone]) call 911 if you are experiencing a Mental Health or Behavioral Health Crisis or need someone to talk to.  Rolin Kerns, LCSW [place]  [facility], [facility] Clinical Social Worker Direct Dial: [phone]  Fax: [phone] Website: [email or web address] 12:59 PM

## 2024-09-16 NOTE — Patient Outreach (Signed)
 Complex Care Management   Visit Note  09/16/2024  Name:  Taylor Wilkinson MRN: [id number] DOB: 07-Nov-1975  Situation: Referral received for Complex Care Management related to Stress/Grief I obtained verbal consent from Patient.  Visit completed with Patient  on the phone  Background:   Past Medical History:  Diagnosis Date   Asthma    Bronchitis    Hypertension    Miscarriage     Assessment: Patient Reported Symptoms:  Cognitive Cognitive Status: No symptoms reported, Alert and oriented to person, place, and time, Normal speech and language skills Cognitive/Intellectual Conditions Management [RPT]: None reported or documented in medical history or problem list   Health Maintenance Behaviors: Annual physical exam  Neurological Neurological Review of Symptoms: No symptoms reported    HEENT HEENT Symptoms Reported: Not assessed      Cardiovascular Cardiovascular Symptoms Reported: No symptoms reported Does patient have uncontrolled Hypertension?: Yes Is patient checking Blood Pressure at home?: Yes Patient's Recent BP reading at home: 122/81 Cardiovascular Management Strategies: Coping strategies, Medication therapy, Routine screening  Respiratory Respiratory Symptoms Reported: Wheezing Respiratory Management Strategies: Asthma action plan, Coping strategies, Medication therapy  Endocrine Endocrine Symptoms Reported: Not assessed    Gastrointestinal Gastrointestinal Symptoms Reported: No symptoms reported      Genitourinary Genitourinary Symptoms Reported: No symptoms reported    Integumentary Integumentary Symptoms Reported: Not assessed    Musculoskeletal Musculoskelatal Symptoms Reviewed: Not assessed        Psychosocial Psychosocial Symptoms Reported: No symptoms reported Behavioral Management Strategies: Adequate rest, Coping strategies, Support system Behavioral Health Comment: Patient is focusing on things she is excited about to promote positive mood and  management of symptoms Major Change/Loss/Stressor/Fears (CP): Medical condition, self Techniques to Cope with Loss/Stress/Change: Diversional activities      09/16/2024    PHQ2-9 Depression Screening   Little interest or pleasure in doing things    Feeling down, depressed, or hopeless    PHQ-2 - Total Score    Trouble falling or staying asleep, or sleeping too much    Feeling tired or having little energy    Poor appetite or overeating     Feeling bad about yourself - or that you are a failure or have let yourself or your family down    Trouble concentrating on things, such as reading the newspaper or watching television    Moving or speaking so slowly that other people could have noticed.  Or the opposite - being so fidgety or restless that you have been moving around a lot more than usual    Thoughts that you would be better off dead, or hurting yourself in some way    PHQ2-9 Total Score    If you checked off any problems, how difficult have these problems made it for you to do your work, take care of things at home, or get along with other people    Depression Interventions/Treatment      There were no vitals filed for this visit.    Medications Reviewed Today     Reviewed by Alexandria Current D, LCSW (Social Worker) on 09/16/24 at 1054  Med List Status: <None>   Medication Order Taking? Sig Documenting Provider Last Dose Status Informant  acetaminophen  (TYLENOL ) 500 MG tablet [id number] Yes Take 1,000 mg by mouth 2 (two) times daily as needed. [provider]  Active   albuterol  (VENTOLIN  HFA) 108 (90 Base) MCG/ACT inhaler [id number] Yes INHALE 2 PUFFS EVERY 4 HOURS AS NEEDED FOR WHEEZING OR DIFFICULTY BREATHING  Diona Perkins, MD  Active   Biotin 89999 MCG TABS [id number] Yes Take 1 tablet by mouth daily. [provider]  Active   budesonide -formoterol  (SYMBICORT ) 160-4.5 MCG/ACT inhaler [id number] Yes Inhale 2 puffs into the lungs 2 (two) times daily. Diona Perkins, MD   Active   ipratropium-albuterol  (DUONEB) 0.5-2.5 (3) MG/3ML SOLN [id number] Yes Take 3 mLs by nebulization every 4 (four) hours as needed. Diona Perkins, MD  Active   loratadine  (CLARITIN ) 10 MG tablet [id number] Yes TAKE 1 TABLET BY MOUTH EVERY DAY Diona Perkins, MD  Active   olmesartan -hydrochlorothiazide (BENICAR  HCT) 20-12.5 MG tablet [id number] Yes Take 1 tablet by mouth daily. Diona Perkins, MD  Active   triamcinolone  ointment (KENALOG ) 0.1 % [id number] Yes Apply topically 2 (two) times daily as needed. Diona Perkins, MD  Active             Recommendation:   PCP Follow-up  Follow Up Plan:   Telephone follow-up in 1 month  Rolin Kerns, LCSW [facility]  [facility], [facility] Clinical Social Worker Direct Dial: [phone]  Fax: [phone] Website: [email or web address] 12:58 PM

## 2024-09-17 ENCOUNTER — Encounter: Payer: Self-pay | Admitting: Family Medicine

## 2024-09-29 ENCOUNTER — Encounter: Payer: Self-pay | Admitting: Family Medicine

## 2024-09-29 ENCOUNTER — Ambulatory Visit: Payer: Self-pay | Admitting: Family Medicine

## 2024-09-29 VITALS — BP 124/80 | HR 88 | Ht 65.0 in | Wt 187.4 lb

## 2024-09-29 DIAGNOSIS — I10 Essential (primary) hypertension: Secondary | ICD-10-CM

## 2024-09-29 NOTE — Patient Instructions (Signed)
 Thank you for visiting clinic today and allowing us  to participate in your care!  Your blood pressure is in a good spot today! Please continue taking your blood pressure medication every day as prescribed. We are checking in on your kidney function today and will keep you updated. Keep up the great work with staying active and eating well!  Please schedule an appointment in 2-3 months for follow up.   Reach out any time with any questions or concerns you may have - we are here for you!  Damien Cassis, MD [facility] [phone]

## 2024-09-29 NOTE — Progress Notes (Unsigned)
" ° ° °  SUBJECTIVE:   CHIEF COMPLAINT / HPI:   HTN Taking olmesartan -hydrochlorothiazide 20-12.5 dail  Practicing healthy lifestyle habits  BP at home have been 110-120/70-80s Will increase to 130-140 if she does not take medication  PERTINENT  PMH / PSH: ***  OBJECTIVE:   BP 124/80   Pulse 88   Ht 5' 5 (1.651 m)   Wt 187 lb 6.4 oz (85 kg)   LMP 09/11/2024 (Exact Date)   SpO2 96%   BMI 31.18 kg/m   General: Well-appearing. Resting comfortably in room. CV: Normal S1/S2. No extra heart sounds. Warm and well-perfused. Pulm: Breathing comfortably on room air. 1 faint expiratory wheeze appreciated on R, otherwise CTAB. No increased WOB. Abd: Soft, non-tender, non-distended. Skin:  Warm, dry. Psych: Pleasant and appropriate.    ASSESSMENT/PLAN:   Assessment & Plan Hypertension, unspecified type      Damien Cassis, MD [facility]  "

## 2024-09-30 LAB — BASIC METABOLIC PANEL WITH GFR
BUN/Creatinine Ratio: 16 (ref 9–23)
BUN: 15 mg/dL (ref 6–24)
CO2: 21 mmol/L (ref 20–29)
Calcium: 9.6 mg/dL (ref 8.7–10.2)
Chloride: 105 mmol/L (ref 96–106)
Creatinine, Ser: 0.92 mg/dL (ref 0.57–1.00)
Glucose: 98 mg/dL (ref 70–99)
Potassium: 3.3 mmol/L — ABNORMAL LOW (ref 3.5–5.2)
Sodium: 142 mmol/L (ref 134–144)
eGFR: 76 mL/min/1.73

## 2024-09-30 NOTE — Assessment & Plan Note (Signed)
 BP well-controlled today.  Continue current regimen as above. BMP today to assess kidney function.  Commended and encouraged continued healthy lifestyle changes.

## 2024-10-01 ENCOUNTER — Ambulatory Visit: Payer: Self-pay | Admitting: Family Medicine

## 2024-10-02 ENCOUNTER — Other Ambulatory Visit: Payer: Self-pay | Admitting: Family Medicine

## 2024-10-02 MED ORDER — POTASSIUM CHLORIDE CRYS ER 20 MEQ PO TBCR: 20.0000 meq | EXTENDED_RELEASE_TABLET | Freq: Every day | ORAL | 0 refills | Status: AC

## 2024-10-12 ENCOUNTER — Other Ambulatory Visit: Payer: Self-pay | Admitting: Internal Medicine

## 2024-10-14 ENCOUNTER — Encounter: Payer: Self-pay | Admitting: Licensed Clinical Social Worker

## 2024-10-14 ENCOUNTER — Telehealth: Payer: Self-pay | Admitting: Licensed Clinical Social Worker

## 2024-10-14 NOTE — Patient Instructions (Signed)
 Taylor Wilkinson - I am sorry I was unable to reach you today for our scheduled appointment. I work with Diona Perkins, MD and am calling to support your healthcare needs. Please contact me at [phone] at your earliest convenience. I look forward to speaking with you soon.   Thank you,  Rolin Kerns, LCSW [place]  [facility], [facility] Clinical Social Worker Direct Dial: [phone]  Fax: [phone] Website: [email or web address] 1:57 PM

## 2024-10-21 ENCOUNTER — Telehealth: Admitting: *Deleted

## 2024-10-28 ENCOUNTER — Telehealth: Admitting: Licensed Clinical Social Worker
